# Patient Record
Sex: Female | Born: 1960 | Race: White | Hispanic: No | Marital: Married | Smoking: Never smoker
Health system: Southern US, Community
[De-identification: ages and names within clinical notes are randomized; demographics above are authoritative.]

## PROBLEM LIST (undated history)

## (undated) DIAGNOSIS — M359 Systemic involvement of connective tissue, unspecified: Secondary | ICD-10-CM

## (undated) DIAGNOSIS — J301 Allergic rhinitis due to pollen: Secondary | ICD-10-CM

## (undated) DIAGNOSIS — M1990 Unspecified osteoarthritis, unspecified site: Secondary | ICD-10-CM

## (undated) DIAGNOSIS — M069 Rheumatoid arthritis, unspecified: Secondary | ICD-10-CM

## (undated) DIAGNOSIS — M722 Plantar fascial fibromatosis: Secondary | ICD-10-CM

## (undated) DIAGNOSIS — E039 Hypothyroidism, unspecified: Secondary | ICD-10-CM

## (undated) HISTORY — DX: Unspecified osteoarthritis, unspecified site: M19.90

## (undated) HISTORY — PX: COLONOSCOPY: SHX174

## (undated) HISTORY — DX: Plantar fascial fibromatosis: M72.2

## (undated) HISTORY — DX: Hypothyroidism, unspecified: E03.9

## (undated) HISTORY — PX: TAYLOR BUNIONECTOMY: SHX2485

## (undated) HISTORY — DX: Rheumatoid arthritis, unspecified: M06.9

## (undated) HISTORY — DX: Allergic rhinitis due to pollen: J30.1

---

## 1991-04-08 HISTORY — PX: TREATMENT FISTULA ANAL: SUR1390

## 1999-04-08 HISTORY — PX: FRACTURE SURGERY: SHX138

## 2001-04-07 HISTORY — PX: BREAST BIOPSY: SHX20

## 2001-09-07 ENCOUNTER — Other Ambulatory Visit: Admission: RE | Admit: 2001-09-07 | Discharge: 2001-09-07 | Payer: Self-pay | Admitting: Internal Medicine

## 2003-03-15 ENCOUNTER — Encounter: Payer: Self-pay | Admitting: Internal Medicine

## 2003-04-08 HISTORY — PX: ENDOMETRIAL ABLATION: SHX621

## 2004-07-11 ENCOUNTER — Ambulatory Visit: Payer: Self-pay | Admitting: Family Medicine

## 2005-01-22 ENCOUNTER — Encounter (INDEPENDENT_AMBULATORY_CARE_PROVIDER_SITE_OTHER): Payer: Self-pay | Admitting: Specialist

## 2005-01-22 ENCOUNTER — Ambulatory Visit: Payer: Self-pay | Admitting: Internal Medicine

## 2005-01-22 ENCOUNTER — Other Ambulatory Visit: Admission: RE | Admit: 2005-01-22 | Discharge: 2005-01-22 | Payer: Self-pay | Admitting: Internal Medicine

## 2005-10-14 ENCOUNTER — Ambulatory Visit: Payer: Self-pay | Admitting: Chiropractor

## 2006-02-20 ENCOUNTER — Ambulatory Visit: Payer: Self-pay | Admitting: Family Medicine

## 2006-04-07 ENCOUNTER — Encounter: Payer: Self-pay | Admitting: Family Medicine

## 2006-04-07 LAB — CONVERTED CEMR LAB: Pap Smear: NORMAL

## 2006-04-16 ENCOUNTER — Ambulatory Visit: Payer: Self-pay | Admitting: Family Medicine

## 2006-04-16 ENCOUNTER — Encounter (INDEPENDENT_AMBULATORY_CARE_PROVIDER_SITE_OTHER): Payer: Self-pay | Admitting: Specialist

## 2006-04-16 ENCOUNTER — Other Ambulatory Visit: Admission: RE | Admit: 2006-04-16 | Discharge: 2006-04-16 | Payer: Self-pay | Admitting: Family Medicine

## 2006-04-17 ENCOUNTER — Ambulatory Visit: Payer: Self-pay | Admitting: Family Medicine

## 2006-04-17 LAB — CONVERTED CEMR LAB
Chol/HDL Ratio, serum: 4.4
Cholesterol: 206 mg/dL (ref 0–200)
LDL DIRECT: 148.9 mg/dL
TSH: 6.75 microintl units/mL — ABNORMAL HIGH (ref 0.35–5.50)
VLDL: 15 mg/dL (ref 0–40)

## 2006-06-02 ENCOUNTER — Ambulatory Visit: Payer: Self-pay | Admitting: Family Medicine

## 2006-06-02 LAB — CONVERTED CEMR LAB: TSH: 3.4 microintl units/mL (ref 0.35–5.50)

## 2007-05-19 ENCOUNTER — Encounter: Payer: Self-pay | Admitting: Family Medicine

## 2007-05-19 DIAGNOSIS — E039 Hypothyroidism, unspecified: Secondary | ICD-10-CM | POA: Insufficient documentation

## 2007-05-19 DIAGNOSIS — J301 Allergic rhinitis due to pollen: Secondary | ICD-10-CM | POA: Insufficient documentation

## 2007-05-20 ENCOUNTER — Other Ambulatory Visit: Admission: RE | Admit: 2007-05-20 | Discharge: 2007-05-20 | Payer: Self-pay | Admitting: Family Medicine

## 2007-05-20 ENCOUNTER — Encounter: Payer: Self-pay | Admitting: Family Medicine

## 2007-05-20 ENCOUNTER — Ambulatory Visit: Payer: Self-pay | Admitting: Family Medicine

## 2007-05-20 LAB — CONVERTED CEMR LAB: Pap Smear: NORMAL

## 2007-05-25 ENCOUNTER — Encounter (INDEPENDENT_AMBULATORY_CARE_PROVIDER_SITE_OTHER): Payer: Self-pay | Admitting: *Deleted

## 2007-05-26 ENCOUNTER — Ambulatory Visit: Payer: Self-pay | Admitting: Family Medicine

## 2007-06-03 LAB — CONVERTED CEMR LAB
ALT: 19 units/L (ref 0–35)
AST: 20 units/L (ref 0–37)
Alkaline Phosphatase: 53 units/L (ref 39–117)
BUN: 9 mg/dL (ref 6–23)
Bilirubin, Direct: 0.1 mg/dL (ref 0.0–0.3)
CO2: 29 meq/L (ref 19–32)
Calcium: 9.6 mg/dL (ref 8.4–10.5)
Chloride: 104 meq/L (ref 96–112)
Creatinine, Ser: 0.9 mg/dL (ref 0.4–1.2)
Glucose, Bld: 110 mg/dL — ABNORMAL HIGH (ref 70–99)
Total Protein: 7.2 g/dL (ref 6.0–8.3)

## 2007-06-11 ENCOUNTER — Telehealth: Payer: Self-pay | Admitting: Family Medicine

## 2007-06-24 ENCOUNTER — Encounter: Payer: Self-pay | Admitting: Family Medicine

## 2007-06-30 ENCOUNTER — Encounter (INDEPENDENT_AMBULATORY_CARE_PROVIDER_SITE_OTHER): Payer: Self-pay | Admitting: *Deleted

## 2007-07-21 ENCOUNTER — Ambulatory Visit: Payer: Self-pay | Admitting: Family Medicine

## 2007-07-23 LAB — CONVERTED CEMR LAB: TSH: 3.02 microintl units/mL (ref 0.35–5.50)

## 2007-08-04 ENCOUNTER — Ambulatory Visit: Payer: Self-pay | Admitting: Family Medicine

## 2007-08-04 DIAGNOSIS — M719 Bursopathy, unspecified: Secondary | ICD-10-CM

## 2007-08-04 DIAGNOSIS — M67919 Unspecified disorder of synovium and tendon, unspecified shoulder: Secondary | ICD-10-CM | POA: Insufficient documentation

## 2007-08-20 ENCOUNTER — Ambulatory Visit: Payer: Self-pay | Admitting: Family Medicine

## 2008-03-29 ENCOUNTER — Telehealth: Payer: Self-pay | Admitting: Family Medicine

## 2008-06-05 HISTORY — PX: ROTATOR CUFF REPAIR: SHX139

## 2008-06-29 ENCOUNTER — Ambulatory Visit (HOSPITAL_BASED_OUTPATIENT_CLINIC_OR_DEPARTMENT_OTHER): Admission: RE | Admit: 2008-06-29 | Discharge: 2008-06-29 | Payer: Self-pay | Admitting: Orthopedic Surgery

## 2008-07-13 ENCOUNTER — Ambulatory Visit: Payer: Self-pay | Admitting: Family Medicine

## 2008-07-18 ENCOUNTER — Ambulatory Visit: Payer: Self-pay | Admitting: Family Medicine

## 2008-07-19 LAB — CONVERTED CEMR LAB
AST: 36 units/L (ref 0–37)
Albumin: 3.8 g/dL (ref 3.5–5.2)
Alkaline Phosphatase: 81 units/L (ref 39–117)
BUN: 15 mg/dL (ref 6–23)
Bilirubin, Direct: 0.1 mg/dL (ref 0.0–0.3)
CO2: 31 meq/L (ref 19–32)
Calcium: 9.4 mg/dL (ref 8.4–10.5)
Chloride: 108 meq/L (ref 96–112)
Creatinine, Ser: 0.8 mg/dL (ref 0.4–1.2)
TSH: 0.87 microintl units/mL (ref 0.35–5.50)
Total Protein: 7.1 g/dL (ref 6.0–8.3)

## 2008-09-06 ENCOUNTER — Encounter: Admission: RE | Admit: 2008-09-06 | Discharge: 2008-09-06 | Payer: Self-pay | Admitting: Orthopedic Surgery

## 2008-09-27 ENCOUNTER — Ambulatory Visit (HOSPITAL_BASED_OUTPATIENT_CLINIC_OR_DEPARTMENT_OTHER): Admission: RE | Admit: 2008-09-27 | Discharge: 2008-09-28 | Payer: Self-pay | Admitting: Orthopedic Surgery

## 2008-10-27 ENCOUNTER — Encounter: Payer: Self-pay | Admitting: Family Medicine

## 2008-10-27 LAB — HM MAMMOGRAPHY

## 2009-08-24 ENCOUNTER — Telehealth: Payer: Self-pay | Admitting: Family Medicine

## 2009-08-24 ENCOUNTER — Ambulatory Visit: Payer: Self-pay | Admitting: Family Medicine

## 2009-08-24 DIAGNOSIS — M19049 Primary osteoarthritis, unspecified hand: Secondary | ICD-10-CM | POA: Insufficient documentation

## 2009-08-24 DIAGNOSIS — M25579 Pain in unspecified ankle and joints of unspecified foot: Secondary | ICD-10-CM | POA: Insufficient documentation

## 2009-08-26 ENCOUNTER — Other Ambulatory Visit: Admission: RE | Admit: 2009-08-26 | Discharge: 2009-08-26 | Payer: Self-pay | Admitting: Family Medicine

## 2009-08-29 ENCOUNTER — Ambulatory Visit: Payer: Self-pay | Admitting: Family Medicine

## 2009-08-29 DIAGNOSIS — E7800 Pure hypercholesterolemia, unspecified: Secondary | ICD-10-CM | POA: Insufficient documentation

## 2009-08-31 ENCOUNTER — Encounter (INDEPENDENT_AMBULATORY_CARE_PROVIDER_SITE_OTHER): Payer: Self-pay | Admitting: *Deleted

## 2009-08-31 LAB — CONVERTED CEMR LAB
ALT: 30 units/L (ref 0–35)
AST: 24 units/L (ref 0–37)
Alkaline Phosphatase: 61 units/L (ref 39–117)
Bilirubin, Direct: 0.1 mg/dL (ref 0.0–0.3)
Chloride: 104 meq/L (ref 96–112)
GFR calc non Af Amer: 94.77 mL/min (ref >60)
HDL: 54.1 mg/dL (ref >39.00)
Potassium: 4.2 meq/L (ref 3.5–5.1)
Rhuematoid fact SerPl-aCnc: 140 intl units/mL — ABNORMAL HIGH (ref 0.0–20.0)
Sodium: 142 meq/L (ref 135–145)
Total Bilirubin: 0.7 mg/dL (ref 0.3–1.2)
VLDL: 18.4 mg/dL (ref 0.0–40.0)

## 2009-09-21 ENCOUNTER — Encounter: Payer: Self-pay | Admitting: Family Medicine

## 2009-12-05 ENCOUNTER — Ambulatory Visit: Payer: Self-pay | Admitting: Family Medicine

## 2009-12-05 LAB — CONVERTED CEMR LAB
Cholesterol: 186 mg/dL (ref 0–200)
HDL: 51.6 mg/dL (ref >39.00)
LDL Cholesterol: 112 mg/dL — ABNORMAL HIGH (ref 0–99)
Total CHOL/HDL Ratio: 4
Triglycerides: 110 mg/dL (ref 0.0–149.0)
VLDL: 22 mg/dL (ref 0.0–40.0)

## 2009-12-24 ENCOUNTER — Encounter: Payer: Self-pay | Admitting: Family Medicine

## 2010-05-07 NOTE — Consult Note (Signed)
Summary: Stacey Drain MD  Stacey Drain MD   Imported By: Sherian Rein 10/01/2009 08:03:21  _____________________________________________________________________  External Attachment:    Type:   Image     Comment:   External Document

## 2010-05-07 NOTE — Progress Notes (Signed)
Summary: needs order for mammogram  Phone Note Call from Patient   Caller: Patient Call For: Kerby Nora MD Summary of Call: Pt needs order for mammogram, to send to [facility] on monday.  She goes to [facility]. Initial call taken by: Lowella Petties CMA,  Aug 24, 2009 5:26 PM  Follow-up for Phone Call        Sent at CPX.  Follow-up by: Kerby Nora MD,  Aug 24, 2009 5:28 PM

## 2010-05-07 NOTE — Letter (Signed)
Summary: Results Follow up Letter  [place] [facility]  [address] [place], [place] [address]   Phone: [phone]  Fax: [phone]    08/31/2009 MRN: [id number]     [facility] [address] [place], [place]  [address]    Dear Amy Hart,  The following are the results of your recent test(s):  Test         Result    Pap Smear:        Normal __x___  Not Normal _____ Comments:Repeat in 1 year ______________________________________________________ Cholesterol: LDL(Bad cholesterol):         Your goal is less than:         HDL (Good cholesterol):       Your goal is more than: Comments:  ______________________________________________________ Mammogram:        Normal _____  Not Normal _____ Comments:  ___________________________________________________________________ Hemoccult:        Normal _____  Not normal _______ Comments:    _____________________________________________________________________ Other Tests:    We routinely do not discuss normal results over the telephone.  If you desire a copy of the results, or you have any questions about this information we can discuss them at your next office visit.   Sincerely,  Kerby Nora MD

## 2010-05-07 NOTE — Letter (Signed)
Summary: Stacey Drain MD Rheumatology  Stacey Drain MD Rheumatology   Imported By: Lanelle Bal 01/03/2010 13:56:54  _____________________________________________________________________  External Attachment:    Type:   Image     Comment:   External Document

## 2010-05-07 NOTE — Assessment & Plan Note (Signed)
Summary: CPX / LFW   Vital Signs:  Patient profile:   50 year old female Height:      64 inches Weight:      198 pounds BMI:     34.11 Temp:     98.3 degrees F oral Pulse rate:   56 / minute Pulse rhythm:   regular BP sitting:   120 / 84  (left arm) Cuff size:   regular  Vitals Entered By: Lowella Petties CMA (Aug 24, 2009 3:55 PM) CC: 30 minute check up   History of Present Illness:  The patient is here for annual wellness exam and preventative care.    Doing overall.   Back to running regularly...but now with pain in right medial post ankle..not exactly over achilles. Occ swelling. Occured with sudden pain after running.    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 Take 1 Tablet By Mouth Once A Day 2)  Multivitamins   Tabs (Multiple Vitamin) .... Take 1 Tablet By Mouth Once A Day 3)  Vitamin C 500 Mg  Tabs (Ascorbic Acid) .... Take 1 Tablet By Mouth Once A Day 4)  Glucosamine-Chondroitin 1500-1200 Mg/33ml  Liqd (Glucosamine-Chondroitin) .... Take 1 Tablet By Mouth Once A Day  Allergies (verified): 1)  Naprosyn (Naproxen)  Past History:  Past medical, surgical, family and social histories (including risk factors) reviewed, and no changes noted (except as noted below).  Past Medical History: Reviewed history from 05/19/2007 and no changes required. ALLERGIC RHINITIS, SEASONAL (ICD-477.0) HYPOTHYROIDISM (ICD-244.9) ATRIAL FIBRILLATION (ICD-427.31)    Past Surgical  History: 1993      Anal fistula repair 1995      Kidney stone? 1/01       Fx., right elbow 2005      Endometrial ablation 1/06       Colposcopy/biopsy (-) [facility]) 2003      Biopsy, right breast (-) 06/2008   shoulder surgery, RCuff tear. 09/2008    elbow surgery  Family History: Reviewed history from 05/20/2007 and no changes required. Father: DM, low BP Mother: hypothyroidism, HTN Siblings: 2 sisters  Social History: Reviewed history from 05/20/2007 and no changes required. Never Smoked Alcohol use-no Marital Status: Single, homosexual Children: None Occupation: Technical brewer at [employer] No exercise Diet: moderate  Review of Systems General:  Denies fatigue. CV:  Denies chest pain or discomfort. Resp:  Denies shortness of breath. GI:  Denies abdominal pain, constipation, and diarrhea. GU:  Denies dysuria. MS:  Complains of joint pain; denies joint redness and joint swelling; Occ uses tylenol arthritis...strong family hx of rheumatoid arthritis. Pain in left PIP and thumb...noted in last year.Marland Kitchen  Physical Exam  General:  Overweight female in NAD Eyes:  No corneal or conjunctival inflammation noted. EOMI. Perrla. Funduscopic exam benign, without hemorrhages, exudates or papilledema. Vision grossly normal. Ears:  External ear exam shows no significant lesions or deformities.  Otoscopic examination reveals clear canals, tympanic membranes are intact bilaterally without bulging, retraction, inflammation or discharge. Hearing is grossly normal bilaterally. Nose:  External nasal examination shows no  deformity or inflammation. Nasal mucosa are pink and moist without lesions or exudates. Mouth:  Oral mucosa and oropharynx without lesions or exudates.  Teeth in good repair. Neck:  no carotid bruit or thyromegaly no cervical or supraclavicular lymphadenopathy  Chest Wall:  No deformities, masses, or tenderness noted. Breasts:  No mass, nodules, thickening, tenderness,  bulging, retraction, inflamation, nipple discharge or skin changes noted.   Lungs:  Normal respiratory effort, chest expands symmetrically. Lungs are clear to auscultation, no crackles or wheezes. Heart:  Normal rate and regular rhythm. S1 and S2 normal without gallop, murmur, click, rub or other extra sounds. Abdomen:  Bowel sounds positive,abdomen soft and non-tender without masses, organomegaly or hernias noted. Genitalia:  Pelvic Exam:        External: normal female genitalia without lesions or masses        Vagina: normal without lesions or masses        Cervix: normal without lesions or masses        Adnexa: normal bimanual exam without masses or fullness        Uterus: normal by palpation        Pap smear: performed Msk:  right medial ankle pain posterior to latereal malleolus Pulses:  R and L posterior tibial pulses are full and equal bilaterally  Extremities:  no edema Neurologic:  No cranial nerve deficits noted. Station and gait are normal. Plantar reflexes are down-going bilaterally. DTRs are symmetrical throughout. Sensory, motor and coordinative functions appear intact. Skin:  Intact without suspicious lesions or rashes Psych:  Cognition and judgment appear intact. Alert and cooperative with normal attention span and concentration. No apparent delusions, illusions, hallucinations   Impression & Recommendations:  Problem # 1:  PHYSICAL EXAMINATION (ICD-V70.0) The patient's preventative maintenance and recommended screening tests for an annual wellness exam were reviewed in full today. Brought up to date unless services declined.  Counselled on the importance of diet, exercise, and its role in overall health and mortality. The patient's FH and SH was reviewed, including their home life, tobacco status, and drug and alcohol status.     Problem # 2:  ROUTINE GYNECOLOGICAL EXAMINATION (ICD-V72.31)  Problem # 3:  ANKLE PAIN, RIGHT (ICD-719.47) Posteroir tibialsynovitis. Treat  with ice, stretching, limiting standing, and NSAIDs.  Call if not improving as expected for referral to Dr. Lucy Antigua.   Problem # 4:  PAIN IN JOINT, HAND (ICD-719.44) Famly hx of RA and current concern given pain in small joints, although not assymetric.   Problem # 5:  PURE HYPERCHOLESTEROLEMIA (ICD-272.0) Due for reeval Encouraged exercise, weight loss, healthy eating habits.  Labs Reviewed: SGOT: 36 (07/18/2008)   SGPT: 44 (07/18/2008)   HDL:44.8 (05/26/2007), 47.1 (04/17/2006)  LDL:DEL (05/26/2007), DEL (04/17/2006)  Chol:202 (05/26/2007), 206 (04/17/2006)  Trig:120 (05/26/2007), 76 (04/17/2006)  Problem # 6:  HYPOTHYROIDISM (ICD-244.9) Well controlled. Continue current medication.  Her updated medication list for this problem includes:    Levoxyl 100 Mcg Tabs (Levothyroxine sodium) .Marland Kitchen... Take 1 tablet by mouth once a day  Complete Medication List: 1)  Levoxyl 100 Mcg Tabs (Levothyroxine sodium) .... Take 1 tablet by mouth once a day 2)  Multivitamins Tabs (Multiple vitamin) .... Take 1 tablet by mouth once a day 3)  Vitamin C 500 Mg Tabs (Ascorbic acid) .... Take 1 tablet by mouth once a day 4)  Glucosamine-chondroitin 1500-1200 Mg/29ml Liqd (Glucosamine-chondroitin) .... Take 1 tablet by mouth once a day  Other Orders: Radiology Referral (Radiology)  Patient Instructions: 1)  Dx 719.44 sed rate, anticcp antibodies, RF, Dx 272.0 CMET, lipids 2)  Referral Appointment Information 3)  Day/Date: 4)  Time: 5)  Place/MD: 6)  Address: 7)  Phone/Fax: 8)  Patient given appointment information. Information/Orders faxed/mailed.   9)  No running, limited standing until next OV. 10)  Wear lace up ankle brace on right ankle. 11)   Gentle range of motion exercises daily. 12)  Follow up appt in 2-3 weeks.  Prior Medications (reviewed today): LEVOXYL 100 MCG  TABS (LEVOTHYROXINE SODIUM) Take 1 tablet by mouth once a day MULTIVITAMINS   TABS (MULTIPLE VITAMIN) Take 1 tablet by mouth  once a day VITAMIN C 500 MG  TABS (ASCORBIC ACID) Take 1 tablet by mouth once a day GLUCOSAMINE-CHONDROITIN 1500-1200 MG/30ML  LIQD (GLUCOSAMINE-CHONDROITIN) Take 1 tablet by mouth once a day Current Allergies (reviewed today): NAPROSYN (NAPROXEN)    Past Medical History:    Reviewed history from 05/19/2007 and no changes required:       ALLERGIC RHINITIS, SEASONAL (ICD-477.0)       HYPOTHYROIDISM (ICD-244.9)       ATRIAL FIBRILLATION (ICD-427.31)          Past Surgical History:    Reviewed history from 05/19/2007 and no changes required:       1993      Anal fistula repair       1995      Kidney stone?       1/01       Fx., right elbow       2005      Endometrial ablation       1/06       Colposcopy/biopsy (-) [facility])       2003      Biopsy, right breast (-)       06/2008   shoulder surgery, RCuff tear.       09/2008    elbow surgery

## 2010-08-20 NOTE — Op Note (Signed)
[name], Amy Hart                ACCOUNT NO.:  [id number]   MEDICAL RECORD NO.:  [id number]          PATIENT TYPE:  AMB   LOCATION:  DSC                          FACILITY:  [facility]   PHYSICIAN:  Loreta Ave, M.D. 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   POSTOPERATIVE DIAGNOSES:  Posttraumatic arthritis, right elbow with  extensive loose bodies and posterior bony impingement with numerous  anterior and posterior loose bodies.  Reactive synovitis.  Diffuse grade  2 and 3 changes with grade 4 changes, radial capitellar joint.  Marked  loss of extension with posterior spurring.   PROCEDURE:  Right elbow exam under anesthesia, arthroscopy with  synovectomy, chondroplasty, and removal of loose bodies, anterior and  posterior.  Aided by a lateral arthrotomy to facilitate removal of the  large loose bodies, partially tethered laterally.  Posterior  decompression, arthroscopically with removal of loose bodies and spurs.   SURGEON:  Loreta Ave, MD   ASSISTANT:  Genene Churn. Barry Dienes, [place], present throughout the entire case  necessary for timely completion of procedure.   ANESTHESIA:  General.   BLOOD LOSS:  Minimal.   TOURNIQUET TIME:  2 hours.   SPECIMENS:  None.   CULTURES:  None.   COMPLICATIONS:  None.   PROCEDURE:  Sterile compressive.   PROCEDURE:  The patient was brought to the operating room and after  adequate anesthesia had been obtained, right elbow was examined..  Fairly good flexion, pronation and supination.  Leg extension for almost  20 degrees.  Palpable loose bodies laterally.  Tourniquet applied,  prepped and draped in the usual sterile fashion.  Exsanguinated with  elevation of Esmarch, tourniquet was inflated to 250 mmHg.  Anteromedial  and  anterolateral portals avoiding neurovascular structures.  Blunt  obturator was used to enter the elbow which was difficult because of the  amount of adhesions and scarring.  Finally entered, arthroscope was  induced, distended and inspected.  Hypertrophic synovitis and adhesions  scar tissues were removed.  Chondroplasty to a stable surface.  The  obvious small loose bodies were removed arthroscopically.  Some of these  very large partially tethered especially laterally.  I made a lateral  arthrotomy through the lateral extensors.  Skin and subcutaneous tissue  were divided.  Extensors capsule divided exposing the joint.  Remaining  partially tethered and other large loose bodies removed.  Fluoroscopic  confirmation of adequate removal.  Wound was irrigated.  The  split in  extensors was closed with Vicryl.  Attention was turned posteriorly.  Two portals of the midline, medial and lateral through the triceps  tendon.  The posterior elbow entered with blunt obturator.  Arthroscope  was induced, distended and inspected.  Remaining loose bodies removed.  Sequential removal of spurs and then synovitis and then spurring of the  fossa and olecranon until all posterior impingement obliterated.  Confirmed adequate excision fluoroscopically and arthroscopically.  Full  extension on completion.  The entire elbow examined and no other  findings appreciated.  Instruments and fluid were all removed.  Portals  were closed with nylon.  Incision was closed with Vicryl and staples.  Sterile compressive dressing applied.  Tourniquet deflated and removed.  Anesthesia was reversed.  Brought to the recovery room.  Tolerated the  surgery well.  No complications.      Loreta Ave, M.D.  Electronically Signed     DFM/MEDQ  D:  09/27/2008  T:  09/28/2008  Job:  [id number]

## 2010-08-20 NOTE — Op Note (Signed)
Amy Hart, Amy Hart                ACCOUNT NO.:  [id number]   MEDICAL RECORD NO.:  [id number]          PATIENT TYPE:  AMB   LOCATION:  DSC                          FACILITY:  [facility]   PHYSICIAN:  Loreta Ave, M.D. DATE OF BIRTH:  1960/09/17   DATE OF PROCEDURE:  06/29/2008  DATE OF DISCHARGE:                               OPERATIVE REPORT   PREOPERATIVE DIAGNOSIS:  Right shoulder impingement distal clavicle  osteolysis rotator cuff tear.   POSTOPERATIVE DIAGNOSIS:  Right shoulder impingement distal clavicle  osteolysis rotator cuff tear.   PROCEDURE:  Right shoulder exam under anesthesia arthroscopy with  arthroscopic assisted rotator cuff repair with fiber weave suture x2  swivel lock anchors x2.  Bursectomy, acromioplasty, coracoacromial  ligament release.  Excision distal clavicle.   SURGEON:  Loreta Ave, MD   ASSISTANT:  Genene Churn. Barry Dienes, PA present throughout the entire case and  necessary for timely completion of procedure.   ANESTHESIA:  General.   BLOOD LOSS:  Minimal.   SPECIMENS:  None.   CULTURES:  None.   COMPLICATIONS:  None.   DRESSING:  Soft compressive with shoulder immobilizer.   PROCEDURE:  The patient was brought to the operating room and placed on  the operating table in supine position.  After adequate anesthesia have  been obtained, shoulder examined.  Full motion stable shoulder.  Placed  in a beach-chair position on the shoulder positioner, prepped and draped  in the usual sterile fashion.  Three portals anterior, posterior and  lateral.  Shoulder entered with blunt obturator.  Arthroscope introduced  and the shoulder distended and inspected.  Full thickness tear anterior  two third of the supraspinatus tendon.  Debrided back to healthy tissue.  Very mobile and reparable.  Tuberosity roughened to bleeding bone.  Articular cartilage, biceps tendon, biceps anchor and labrum all  otherwise intact.  From above type 2 acromion debrided.   Converted to a  type 1 with a shaver and high-speed bur releasing the CA ligament with  cautery.  Distal clavicle grade 3 changes with some spurring.  Periarticular spurs and lateral centimeter of clavicle resected.  Adequacy of decompression confirmed viewing from all portals.  Cannula  placed lateral and anterior.  The cuff was mobilized.  Captured with two  horizontal mattress fiber weave sutures with the scorpion device.  Suture brought out the front and then sequentially out laterally.  Both  were captured with a swivel lock anchor and then firmly anchored down in  the tuberosity to two anchoring sites with  swivel lock anchors.  Nice firm watertight closure and repair of the  cuff confirmed.  Instruments and fluid removed.  Portals and shoulder  vests injected with Marcaine.  Portals closed with 4-0 nylon.  Sterile  compressive dressing applied.  Anesthesia reversed.  Brought to the  recovery room.  Tolerated the surgery well and no complications.      Loreta Ave, M.D.  Electronically Signed     DFM/MEDQ  D:  06/29/2008  T:  06/30/2008  Job:  [id number]

## 2010-10-28 ENCOUNTER — Telehealth: Payer: Self-pay | Admitting: Family Medicine

## 2010-10-28 DIAGNOSIS — E7800 Pure hypercholesterolemia, unspecified: Secondary | ICD-10-CM

## 2010-10-28 DIAGNOSIS — E039 Hypothyroidism, unspecified: Secondary | ICD-10-CM

## 2010-10-28 NOTE — Telephone Encounter (Signed)
Message copied by Excell Seltzer on Mon Oct 28, 2010  8:05 AM ------      Message from: Baldomero Lamy      Created: Tue Oct 22, 2010 12:10 PM      Regarding: Cpx labs tues       Please order  future cpx labs for pt's upcomming lab appt.      Thanks      Rodney Booze

## 2010-10-29 ENCOUNTER — Other Ambulatory Visit (INDEPENDENT_AMBULATORY_CARE_PROVIDER_SITE_OTHER): Payer: BC Managed Care – PPO | Admitting: Family Medicine

## 2010-10-29 DIAGNOSIS — E7800 Pure hypercholesterolemia, unspecified: Secondary | ICD-10-CM

## 2010-10-29 DIAGNOSIS — E039 Hypothyroidism, unspecified: Secondary | ICD-10-CM

## 2010-10-29 LAB — LIPID PANEL
Cholesterol: 175 mg/dL (ref 0–200)
LDL Cholesterol: 103 mg/dL — ABNORMAL HIGH (ref 0–99)
Total CHOL/HDL Ratio: 4

## 2010-10-29 LAB — COMPREHENSIVE METABOLIC PANEL
Albumin: 4 g/dL (ref 3.5–5.2)
Alkaline Phosphatase: 55 U/L (ref 39–117)
CO2: 30 mEq/L (ref 19–32)
Calcium: 9.2 mg/dL (ref 8.4–10.5)
Chloride: 104 mEq/L (ref 96–112)
Glucose, Bld: 90 mg/dL (ref 70–99)
Potassium: 4.4 mEq/L (ref 3.5–5.1)
Sodium: 140 mEq/L (ref 135–145)
Total Protein: 7.2 g/dL (ref 6.0–8.3)

## 2010-11-12 ENCOUNTER — Encounter: Payer: Self-pay | Admitting: Family Medicine

## 2010-11-13 ENCOUNTER — Encounter: Payer: Self-pay | Admitting: Family Medicine

## 2010-11-13 LAB — HM PAP SMEAR

## 2010-11-22 ENCOUNTER — Encounter: Payer: Self-pay | Admitting: Family Medicine

## 2010-11-22 ENCOUNTER — Ambulatory Visit (INDEPENDENT_AMBULATORY_CARE_PROVIDER_SITE_OTHER): Payer: BC Managed Care – PPO | Admitting: Family Medicine

## 2010-11-22 DIAGNOSIS — E039 Hypothyroidism, unspecified: Secondary | ICD-10-CM

## 2010-11-22 DIAGNOSIS — Z1231 Encounter for screening mammogram for malignant neoplasm of breast: Secondary | ICD-10-CM

## 2010-11-22 DIAGNOSIS — Z01419 Encounter for gynecological examination (general) (routine) without abnormal findings: Secondary | ICD-10-CM

## 2010-11-22 DIAGNOSIS — Z0000 Encounter for general adult medical examination without abnormal findings: Secondary | ICD-10-CM

## 2010-11-22 DIAGNOSIS — E7800 Pure hypercholesterolemia, unspecified: Secondary | ICD-10-CM

## 2010-11-22 NOTE — Patient Instructions (Signed)
When she is able, start back on exercise.

## 2010-11-22 NOTE — Progress Notes (Signed)
Subjective:    Patient ID: Amy Hart, female    DOB: [birth year], 50 y.o.   MRN: [id number]  HPI  50 year old female here for CPX.  Hypothyroid. Well controlled on replacement Lab Results  Component Value Date   TSH 2.09 10/29/2010    High cholesterol:  At goal LDL < 130 on no medication.   Past joint pain.. She has OA of hands. Pother areas of pain, along with elevated RF... Saw Dr. Kellie Simmering last year. Felt not enough swelling or arthritis to treat with methotrexate. She does well on OTC meds , but pain is much worse over the winter in hands. Last saw him in 07/2010.. Had steroid injection in  MCP joint of left thumb. She is on glucosamine.  Right foot: Dr. Donzetta Kohut.. 09/2010 tailor's bunion treated.  Foot currently in post op shoe because screw being pushed out.  Has follow up with her 8/22.  She is not currently exercising due to foot issues. Has gained 7 lbs in last 2 months.   Review of Systems  Constitutional: Negative for fever, fatigue and unexpected weight change.  HENT: Negative for ear pain, congestion, sore throat, sneezing, trouble swallowing and sinus pressure.   Eyes: Negative for pain and itching.  Respiratory: Negative for cough, chest tightness, shortness of breath and wheezing.        Continues to have a nagging cough since URI  7/8  Cardiovascular: Negative for chest pain, palpitations and leg swelling.  Gastrointestinal: Positive for constipation. Negative for nausea, abdominal pain, diarrhea and blood in stool.  Genitourinary: Negative for dysuria, hematuria, vaginal discharge, difficulty urinating and menstrual problem.  Skin: Negative for rash.  Neurological: Negative for syncope, weakness, light-headedness, numbness and headaches.  Psychiatric/Behavioral: Negative for confusion and dysphoric mood. The patient is not nervous/anxious.        Objective:   Physical Exam  Constitutional: Vital signs are normal. She appears well-developed and  well-nourished. She is cooperative.  Non-toxic appearance. She does not appear ill. No distress.  HENT:  Head: Normocephalic.  Right Ear: Hearing, tympanic membrane, external ear and ear canal normal.  Left Ear: Hearing, tympanic membrane, external ear and ear canal normal.  Nose: Nose normal.  Eyes: Conjunctivae, EOM and lids are normal. Pupils are equal, round, and reactive to light. No foreign bodies found.  Neck: Trachea normal and normal range of motion. Neck supple. Carotid bruit is not present. No mass and no thyromegaly present.  Cardiovascular: Normal rate, regular rhythm, S1 normal, S2 normal, normal heart sounds and intact distal pulses.  Exam reveals no gallop.   No murmur heard. Pulmonary/Chest: Effort normal and breath sounds normal. No respiratory distress. She has no wheezes. She has no rhonchi. She has no rales.  Abdominal: Soft. Normal appearance and bowel sounds are normal. She exhibits no distension, no fluid wave, no abdominal bruit and no mass. There is no hepatosplenomegaly. There is no tenderness. There is no rebound, no guarding and no CVA tenderness. No hernia.  Genitourinary: Vagina normal and uterus normal. No breast swelling, tenderness, discharge or bleeding. Pelvic exam was performed with patient prone. There is no rash, tenderness, lesion or injury on the right labia. There is no rash, tenderness, lesion or injury on the left labia. Uterus is not deviated, not enlarged, not fixed and not tender. Cervix exhibits no motion tenderness, no discharge and no friability. Right adnexum displays no mass, no tenderness and no fullness. Left adnexum displays no mass, no tenderness and no fullness.  No erythema, tenderness or bleeding around the vagina. No foreign body around the vagina. No signs of injury around the vagina. No vaginal discharge found.       No pap performed  Lymphadenopathy:    She has no cervical adenopathy.    She has no axillary adenopathy.  Neurological: She  is alert. She has normal strength. No cranial nerve deficit or sensory deficit.  Skin: Skin is warm, dry and intact. No rash noted.  Psychiatric: Her speech is normal and behavior is normal. Judgment normal. Her mood appears not anxious. Cognition and memory are normal. She does not exhibit a depressed mood.          Assessment & Plan:  Complete Physcial Exam: The patient's preventative maintenance and recommended screening tests for an annual wellness exam were reviewed in full today. Brought up to date unless services declined.  Counselled on the importance of diet, exercise, and its role in overall health and mortality. The patient's FH and SH was reviewed, including their home life, tobacco status, and drug and alcohol status.   Paps nml 3 in row , so skip this year, next pap 2013. Uptodate with TDap. Due for mammogram.

## 2010-11-22 NOTE — Assessment & Plan Note (Signed)
Well controlled on no medication  

## 2010-11-22 NOTE — Assessment & Plan Note (Signed)
Well controlled. Continue current medication.  

## 2010-11-28 ENCOUNTER — Other Ambulatory Visit: Payer: Self-pay | Admitting: *Deleted

## 2010-11-28 MED ORDER — LEVOTHYROXINE SODIUM 100 MCG PO TABS: 100.0000 ug | ORAL_TABLET | Freq: Every day | ORAL | Status: DC

## 2010-12-02 ENCOUNTER — Encounter: Payer: Self-pay | Admitting: Family Medicine

## 2010-12-03 LAB — HM MAMMOGRAPHY: HM Mammogram: NORMAL

## 2011-10-10 ENCOUNTER — Encounter: Payer: BC Managed Care – PPO | Admitting: Family Medicine

## 2011-11-24 ENCOUNTER — Other Ambulatory Visit: Payer: Self-pay | Admitting: *Deleted

## 2011-11-24 MED ORDER — LEVOTHYROXINE SODIUM 100 MCG PO TABS: 100.0000 ug | ORAL_TABLET | Freq: Every day | ORAL | Status: DC

## 2011-11-25 ENCOUNTER — Encounter: Payer: Self-pay | Admitting: Family Medicine

## 2011-11-25 ENCOUNTER — Ambulatory Visit (INDEPENDENT_AMBULATORY_CARE_PROVIDER_SITE_OTHER): Payer: BC Managed Care – PPO | Admitting: Family Medicine

## 2011-11-25 VITALS — BP 110/72 | HR 63 | Temp 98.7°F | Ht 65.0 in | Wt 200.5 lb

## 2011-11-25 DIAGNOSIS — Z1211 Encounter for screening for malignant neoplasm of colon: Secondary | ICD-10-CM

## 2011-11-25 DIAGNOSIS — Z1231 Encounter for screening mammogram for malignant neoplasm of breast: Secondary | ICD-10-CM

## 2011-11-25 DIAGNOSIS — E039 Hypothyroidism, unspecified: Secondary | ICD-10-CM

## 2011-11-25 DIAGNOSIS — Z0000 Encounter for general adult medical examination without abnormal findings: Secondary | ICD-10-CM

## 2011-11-25 DIAGNOSIS — E7800 Pure hypercholesterolemia, unspecified: Secondary | ICD-10-CM

## 2011-11-25 LAB — TSH: TSH: 2.42 u[IU]/mL (ref 0.35–5.50)

## 2011-11-25 LAB — COMPREHENSIVE METABOLIC PANEL
ALT: 30 U/L (ref 0–35)
AST: 26 U/L (ref 0–37)
CO2: 30 mEq/L (ref 19–32)
Chloride: 102 mEq/L (ref 96–112)
GFR: 71.18 mL/min (ref >60.00)
Sodium: 139 mEq/L (ref 135–145)
Total Bilirubin: 0.8 mg/dL (ref 0.3–1.2)
Total Protein: 7.2 g/dL (ref 6.0–8.3)

## 2011-11-25 LAB — LDL CHOLESTEROL, DIRECT: Direct LDL: 131.3 mg/dL

## 2011-11-25 LAB — LIPID PANEL: VLDL: 16.8 mg/dL (ref 0.0–40.0)

## 2011-11-25 NOTE — Assessment & Plan Note (Signed)
Due for re-eval. 

## 2011-11-25 NOTE — Patient Instructions (Addendum)
Get back on track with healthy eating and exercise. Work on weight loss.  We will call with lab results. Stop at front desk to set up mammogram and colonoscopy.

## 2011-11-25 NOTE — Progress Notes (Signed)
Subjective:    Patient ID: Amy Hart, female    DOB: 01-Dec-1960, 51 y.o.   MRN: [id number]  HPI  The patient is here for annual wellness exam and preventative care.    Elevated Cholesterol:Due for re-eval. Lab Results  Component Value Date   CHOL 175 10/29/2010   HDL 48.00 10/29/2010   LDLCALC 103* 10/29/2010   LDLDIRECT 149.1 08/29/2009   TRIG 121.0 10/29/2010   CHOLHDL 4 10/29/2010    Using medications without problems:on fish oil  Muscle aches: None Diet compliance: Poor, gaining weight. A lot of emotional stress. Exercise:None , trying to get back to runing. Other complaints:  Hypothyroid: Due for re-eval. 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 Prednisone at higher dose caused anger issues. Review of Systems  Constitutional: Negative for fever, fatigue and unexpected weight change.  HENT: Negative for ear pain, congestion, sore throat, sneezing, trouble swallowing and sinus pressure.   Eyes: Negative for pain and itching.  Respiratory: Negative for cough, shortness of breath and wheezing.   Cardiovascular: Negative for chest pain, palpitations and leg swelling.  Gastrointestinal: Negative for nausea, abdominal pain, diarrhea, constipation and blood in stool.  Genitourinary: Negative for dysuria, hematuria, vaginal discharge, difficulty urinating and menstrual problem.  Skin: Negative for rash.  Neurological: Negative for syncope, weakness, light-headedness, numbness and headaches.  Psychiatric/Behavioral: Negative for confusion and dysphoric mood. The patient is not nervous/anxious.        HAs had a lot of stress this year, father died and partner with breast cancer.       Objective:   Physical Exam  Constitutional: Vital signs are normal. She appears well-developed and well-nourished. She is cooperative.  Non-toxic appearance. She does not appear ill.  No distress.  HENT:  Head: Normocephalic.  Right Ear: Hearing, tympanic membrane, external ear and ear canal normal.  Left Ear: Hearing, tympanic membrane, external ear and ear canal normal.  Nose: Nose normal.  Eyes: Conjunctivae, EOM and lids are normal. Pupils are equal, round, and reactive to light. No foreign bodies found.  Neck: Trachea normal and normal range of motion. Neck supple. Carotid bruit is not present. No mass and no thyromegaly present.  Cardiovascular: Normal rate, regular rhythm, S1 normal, S2 normal, normal heart sounds and intact distal pulses.  Exam reveals no gallop.   No murmur heard. Pulmonary/Chest: Effort normal and breath sounds normal. No respiratory distress. She has no wheezes. She has no rhonchi. She has no rales.  Abdominal: Soft. Normal appearance and bowel sounds are normal. She exhibits no distension, no fluid wave, no abdominal bruit and no mass. There is no hepatosplenomegaly. There is no tenderness. There is no rebound, no guarding and no CVA tenderness. No hernia.  Genitourinary: Vagina normal and uterus normal. No breast swelling, tenderness, discharge or bleeding. Pelvic exam was performed with patient prone. There is no rash, tenderness, lesion or injury on the right labia. There is no rash, tenderness, lesion or injury on the left labia. Uterus is not deviated, not enlarged, not fixed and not tender. Cervix exhibits no motion tenderness, no discharge and no friability. Right adnexum displays no mass, no tenderness and no fullness. Left adnexum displays no mass, no tenderness and no fullness. No erythema, tenderness or bleeding around the vagina. No foreign body around the vagina. No signs of injury around the vagina. No vaginal discharge found.  No pap performed  Lymphadenopathy:    She has no cervical adenopathy.    She has no axillary adenopathy.  Neurological: She is alert. She has normal strength. No cranial nerve deficit or sensory deficit.    Skin: Skin is warm, dry and intact. No rash noted.  Psychiatric: Her speech is normal and behavior is normal. Judgment normal. Her mood appears not anxious. Cognition and memory are normal. She does not exhibit a depressed mood.          Assessment & Plan:  The patient's preventative maintenance and recommended screening tests for an annual wellness exam were reviewed in full today. Brought up to date unless services declined.  Counselled on the importance of diet, exercise, and its role in overall health and mortality. The patient's FH and SH was reviewed, including their home life, tobacco status, and drug and alcohol status.   Vaccines:uptodate with td. Nonsmoker. PAP/DVE: Q 3 year, last done 2011, nml due 2014 Mammo:due  Colon:

## 2011-12-02 ENCOUNTER — Encounter: Payer: Self-pay | Admitting: *Deleted

## 2011-12-02 ENCOUNTER — Encounter: Payer: Self-pay | Admitting: Family Medicine

## 2011-12-02 LAB — HM MAMMOGRAPHY: HM Mammogram: NORMAL

## 2011-12-25 ENCOUNTER — Other Ambulatory Visit: Payer: Self-pay

## 2011-12-25 MED ORDER — LEVOTHYROXINE SODIUM 100 MCG PO TABS: 100.0000 ug | ORAL_TABLET | Freq: Every day | ORAL | Status: DC

## 2012-01-02 ENCOUNTER — Encounter: Payer: Self-pay | Admitting: Internal Medicine

## 2012-01-02 ENCOUNTER — Ambulatory Visit (INDEPENDENT_AMBULATORY_CARE_PROVIDER_SITE_OTHER): Payer: BC Managed Care – PPO | Admitting: Internal Medicine

## 2012-01-02 VITALS — BP 100/70 | HR 68 | Ht 63.75 in | Wt 200.2 lb

## 2012-01-02 DIAGNOSIS — Z1211 Encounter for screening for malignant neoplasm of colon: Secondary | ICD-10-CM

## 2012-01-02 DIAGNOSIS — M069 Rheumatoid arthritis, unspecified: Secondary | ICD-10-CM | POA: Insufficient documentation

## 2012-01-02 MED ORDER — NA SULFATE-K SULFATE-MG SULF 17.5-3.13-1.6 GM/177ML PO SOLN: ORAL | Status: DC

## 2012-01-02 NOTE — Progress Notes (Signed)
  Subjective:    Patient ID: Amy Hart, female    DOB: 1960/11/13, 51 y.o.   MRN: [id number]  HPI Is a very pleasant 51 year old white woman who has no GI symptoms. She is here to schedule a screening colonoscopy.  Medications, allergies, past medical history, past surgical history, family history and social history are reviewed and updated in the EMR.  Review of Systems Positive for allergies and back pain    Objective:   Physical Exam General:  NAD Eyes:   anicteric Lungs:  clear Heart:  S1S2 no rubs, murmurs or gallops Abdomen:  soft and nontender, BS+ Ext:   no edema        Assessment & Plan:   1. Special screening for malignant neoplasms, colon    1. We'll schedule routine screening colonoscopy. The risks and benefits as well as alternatives of endoscopic procedure(s) have been discussed and reviewed. All questions answered. The patient agrees to proceed. 2.

## 2012-01-02 NOTE — Patient Instructions (Addendum)
You have been scheduled for a colonoscopy with propofol. Please follow written instructions given to you at your visit today.  Please pick up your prep kit at the pharmacy within the next 1-3 days. If you use inhalers (even only as needed), please bring them with you on the day of your procedure.  Thank you for choosing me and [facility].  Carl E. Gessner, M.D., FACG  

## 2012-01-22 ENCOUNTER — Encounter: Payer: BC Managed Care – PPO | Admitting: Internal Medicine

## 2012-02-05 ENCOUNTER — Ambulatory Visit (AMBULATORY_SURGERY_CENTER): Payer: BC Managed Care – PPO | Admitting: Internal Medicine

## 2012-02-05 ENCOUNTER — Encounter: Payer: Self-pay | Admitting: Internal Medicine

## 2012-02-05 VITALS — BP 123/73 | HR 52 | Temp 97.9°F | Resp 6 | Ht 63.0 in | Wt 200.0 lb

## 2012-02-05 DIAGNOSIS — Z1211 Encounter for screening for malignant neoplasm of colon: Secondary | ICD-10-CM

## 2012-02-05 MED ORDER — SODIUM CHLORIDE 0.9 % IV SOLN: 500.0000 mL | INTRAVENOUS | Status: DC

## 2012-02-05 NOTE — Patient Instructions (Addendum)
The colonoscopy was normal!  Next routine exam in 10 years - 2023.  Thank you for choosing me and [facility].  Iva Boop, MD, FACG YOU HAD AN ENDOSCOPIC PROCEDURE TODAY AT THE [facility]: Refer to the procedure report that was given to you for any specific questions about what was found during the examination.  If the procedure report does not answer your questions, please call your gastroenterologist to clarify.  If you requested that your care partner not be given the details of your procedure findings, then the procedure report has been included in a sealed envelope for you to review at your convenience later.  YOU SHOULD EXPECT: Some feelings of bloating in the abdomen. Passage of more gas than usual.  Walking can help get rid of the air that was put into your GI tract during the procedure and reduce the bloating. If you had a lower endoscopy (such as a colonoscopy or flexible sigmoidoscopy) you may notice spotting of blood in your stool or on the toilet paper. If you underwent a bowel prep for your procedure, then you may not have a normal bowel movement for a few days.  DIET: Your first meal following the procedure should be a light meal and then it is ok to progress to your normal diet.  A half-sandwich or bowl of soup is an example of a good first meal.  Heavy or fried foods are harder to digest and may make you feel nauseous or bloated.  Likewise meals heavy in dairy and vegetables can cause extra gas to form and this can also increase the bloating.  Drink plenty of fluids but you should avoid alcoholic beverages for 24 hours.  ACTIVITY: Your care partner should take you home directly after the procedure.  You should plan to take it easy, moving slowly for the rest of the day.  You can resume normal activity the day after the procedure however you should NOT DRIVE or use heavy machinery for 24 hours (because of the sedation medicines used during the test).     SYMPTOMS TO REPORT IMMEDIATELY: A gastroenterologist can be reached at any hour.  During normal business hours, 8:30 AM to 5:00 PM Monday through Friday, call [phone].  After hours and on weekends, please call the GI answering service at [phone] who will take a message and have the physician on call contact you.   Following lower endoscopy (colonoscopy or flexible sigmoidoscopy):  Excessive amounts of blood in the stool  Significant tenderness or worsening of abdominal pains  Swelling of the abdomen that is new, acute  Fever of 100F or higher  FOLLOW UP: If any biopsies were taken you will be contacted by phone or by letter within the next 1-3 weeks.  Call your gastroenterologist if you have not heard about the biopsies in 3 weeks.  Our staff will call the home number listed on your records the next business day following your procedure to check on you and address any questions or concerns that you may have at that time regarding the information given to you following your procedure. This is a courtesy call and so if there is no answer at the home number and we have not heard from you through the emergency physician on call, we will assume that you have returned to your regular daily activities without incident.  SIGNATURES/CONFIDENTIALITY: You and/or your care partner have signed paperwork which will be entered into your electronic medical record.  These signatures  attest to the fact that that the information above on your After Visit Summary has been reviewed and is understood.  Full responsibility of the confidentiality of thiYOU HAD AN ENDOSCOPIC PROCEDURE TODAY AT THE [facility]: Refer to the procedure report that was given to you for any specific questions about what was found during the examination.  If the procedure report does not answer your questions, please call your gastroenterologist to clarify.  If you requested that your care partner not be given the  details of your procedure findings, then the procedure report has been included in a sealed envelope for you to review at your convenience later.

## 2012-02-05 NOTE — Progress Notes (Addendum)
Patient did not have preoperative order for IV antibiotic SSI prophylaxis. (G8918)  Patient did not experience any of the following events: a burn prior to discharge; a fall within the facility; wrong site/side/patient/procedure/implant event; or a hospital transfer or hospital admission upon discharge from the facility. (G8907)  

## 2012-02-05 NOTE — Progress Notes (Signed)
1420 a/ox3 pleased report to [employer]

## 2012-02-05 NOTE — Op Note (Signed)
[facility] [address] [place], [address]   COLONOSCOPY PROCEDURE REPORT  PATIENT: Amy Hart, Amy Hart  MR#: [id number] BIRTHDATE: 05-28-1960 , 51  yrs. old GENDER: Female ENDOSCOPIST: Iva Boop, MD, [facility] REFERRED BY:Amy Michelle Nasuti, M.D. PROCEDURE DATE:  02/05/2012 PROCEDURE:   Colonoscopy, diagnostic ASA CLASS:   Class II INDICATIONS:average risk screening. MEDICATIONS: Propofol (Diprivan) 260 mg IV, MAC sedation, administered by CRNA, and These medications were titrated to patient response per physician's verbal order  DESCRIPTION OF PROCEDURE:   After the risks benefits and alternatives of the procedure were thoroughly explained, informed consent was obtained.  A digital rectal exam revealed no abnormalities of the rectum.   The LB CF-[id number]  endoscope was introduced through the anus and advanced to the cecum, which was identified by both the appendix and ileocecal valve. No adverse events experienced.   The quality of the prep was Suprep excellent The instrument was then slowly withdrawn as the colon was fully examined.      COLON FINDINGS: A normal appearing cecum, ileocecal valve, and appendiceal orifice were identified.  The ascending, hepatic flexure, transverse, splenic flexure, descending, sigmoid colon and rectum appeared unremarkable.  No polyps or cancers were seen. Retroflexed views revealed no abnormalities. The time to cecum=6 minutes 06 seconds.  Withdrawal time=7 minutes 22 seconds.  The scope was withdrawn and the procedure completed. COMPLICATIONS: There were no complications.  ENDOSCOPIC IMPRESSION: Normal colon - excellent prep  RECOMMENDATIONS: Repeat Colonscopy in 10 years.   eSigned:  Iva Boop, MD, [facility] 02/05/2012 2:20 PM   cc: Excell Seltzer, MD and The Patient

## 2012-02-06 ENCOUNTER — Telehealth: Payer: Self-pay

## 2012-02-06 NOTE — Telephone Encounter (Signed)
  Follow up Call-  Call back number 02/05/2012  Post procedure Call Back phone  # [phone]  Permission to leave phone message Yes     Patient questions:  Do you have a fever, pain , or abdominal swelling? no Pain Score  0 *  Have you tolerated food without any problems? yes  Have you been able to return to your normal activities? yes  Do you have any questions about your discharge instructions: Diet   no Medications  no Follow up visit  no  Do you have questions or concerns about your Care? no  Actions: * If pain score is 4 or above: No action needed, pain <4.

## 2012-07-13 ENCOUNTER — Ambulatory Visit (INDEPENDENT_AMBULATORY_CARE_PROVIDER_SITE_OTHER): Payer: BC Managed Care – PPO | Admitting: Family Medicine

## 2012-07-13 ENCOUNTER — Encounter: Payer: Self-pay | Admitting: Family Medicine

## 2012-07-13 ENCOUNTER — Encounter: Payer: Self-pay | Admitting: *Deleted

## 2012-07-13 VITALS — BP 120/84 | HR 77 | Temp 98.1°F | Ht 63.0 in | Wt 210.5 lb

## 2012-07-13 DIAGNOSIS — J019 Acute sinusitis, unspecified: Secondary | ICD-10-CM | POA: Insufficient documentation

## 2012-07-13 MED ORDER — DOXYCYCLINE HYCLATE 100 MG PO TABS: 100.0000 mg | ORAL_TABLET | Freq: Two times a day (BID) | ORAL | Status: DC

## 2012-07-13 NOTE — Assessment & Plan Note (Signed)
Doubt lung infection but given on HUmira.. Will treat with broadened antibiotics, doxycycline to cover bronchitis and atypical. Hold Humira. Cotinue mucolytics.

## 2012-07-13 NOTE — Progress Notes (Signed)
  Subjective:    Patient ID: Amy Hart, female    DOB: 1960-07-01, 52 y.o.   MRN: [id number]  HPI 52 year old female with history of seasonal allergic rhinitis presents with sinus symptoms.  3/10 began having cold symptoms. No fever.  She is on Humira for RA. Held this.  Symptoms resolved almost completely, except for congestion. Started back on Humira on 3/30.   4/4 Her symptoms have returned. Nasal congestion, chest heaviness. Cough, productive. No SOB or wheeze.  Greenish nasal discharge now. Sinus pressure and headache.  No ear pain, mainly ear pressure, decreased hearing B.  No fever, occ chills. She just got back from [place].  Has used Nyquil, Dayquil.    Review of Systems  Constitutional: Negative for fever and fatigue.  HENT: Negative for ear pain.   Eyes: Negative for pain.  Respiratory: Negative for chest tightness and shortness of breath.   Cardiovascular: Negative for chest pain, palpitations and leg swelling.  Gastrointestinal: Negative for abdominal pain.  Genitourinary: Negative for dysuria.       Objective:   Physical Exam  Constitutional: Vital signs are normal. She appears well-developed and well-nourished. She is cooperative.  Non-toxic appearance. She does not appear ill. No distress.  HENT:  Head: Normocephalic.  Right Ear: Hearing, external ear and ear canal normal. Tympanic membrane is injected. Tympanic membrane is not retracted and not bulging. A middle ear effusion is present.  Left Ear: Hearing, external ear and ear canal normal. Tympanic membrane is injected. Tympanic membrane is not retracted and not bulging. A middle ear effusion is present.  Nose: Mucosal edema and rhinorrhea present. Right sinus exhibits maxillary sinus tenderness. Right sinus exhibits no frontal sinus tenderness. Left sinus exhibits maxillary sinus tenderness. Left sinus exhibits no frontal sinus tenderness.  Mouth/Throat: Uvula is midline, oropharynx is clear and moist and  mucous membranes are normal.  Eyes: Conjunctivae, EOM and lids are normal. Pupils are equal, round, and reactive to light. No foreign bodies found.  Neck: Trachea normal and normal range of motion. Neck supple. Carotid bruit is not present. No mass and no thyromegaly present.  Cardiovascular: Normal rate, regular rhythm, S1 normal, S2 normal, normal heart sounds, intact distal pulses and normal pulses.  Exam reveals no gallop and no friction rub.   No murmur heard. Pulmonary/Chest: Effort normal and breath sounds normal. Not tachypneic. No respiratory distress. She has no decreased breath sounds. She has no wheezes. She has no rales.  occ exp rhonchi in right lower lung... Cleared with cough... Likely upper airway noise  Neurological: She is alert.  Skin: Skin is warm, dry and intact. No rash noted.  Psychiatric: Her speech is normal and behavior is normal. Judgment normal. Her mood appears not anxious. Cognition and memory are normal. She does not exhibit a depressed mood.          Assessment & Plan:

## 2012-07-13 NOTE — Patient Instructions (Addendum)
Complete antibiotics. Hold Humira. Rest, hydration. Call if any shortness of breath, fever on antibiotics or if symptoms are not improving significantly in 48-72 hours.

## 2012-07-15 ENCOUNTER — Telehealth: Payer: Self-pay | Admitting: Family Medicine

## 2012-07-15 MED ORDER — AMOXICILLIN-POT CLAVULANATE 875-125 MG PO TABS: 1.0000 | ORAL_TABLET | Freq: Two times a day (BID) | ORAL | Status: DC

## 2012-07-15 NOTE — Telephone Encounter (Signed)
Will change antibotics to augmentin

## 2012-07-15 NOTE — Telephone Encounter (Signed)
Patient advised.

## 2012-07-15 NOTE — Telephone Encounter (Signed)
Patient Information:  Caller Name: Phoebe  Phone: [phone]  Patient: Amy Hart, Amy Hart  Gender: Female  DOB: 20-Aug-1960  Age: 52 Years  PCP: Kerby Nora (Family Practice)  Pregnant: No  Office Follow Up:  Does the office need to follow up with this patient?: Yes  Instructions For The Office: Follow up call; patient not improved within 48 hours, and is calling to notify Dr. Ermalene Searing as instructed krs/can  RN Note:  States it "sort of felt like the left ear was draining deep inside," but no fluid noted in the external canal.  Afebrile.  States is not worse, but has not noted improvement at all.  Per protocol, emergent symptoms denied; disposition See Today in OFfice.  Patient has been evaluated and is on antibiotics; info to office for provider review/Rx change/callback.  May reach patient at [phone].  krs/can.  Symptoms  Reason For Call & Symptoms: diagnosed with BOM 07/13/12 and told to call back if not "drastically improved in 48 hours," to call back.  Has had 5 doses of antibiotic thus far, and is not improved at all.  Reviewed Health History In EMR: Yes  Reviewed Medications In EMR: Yes  Reviewed Allergies In EMR: Yes  Reviewed Surgeries / Procedures: Yes  Date of Onset of Symptoms: Unknown OB / GYN:  LMP: Unknown  Guideline(s) Used:  Earache  Disposition Per Guideline:   See Today in Office  Reason For Disposition Reached:   All other earaches (Exceptions: earache lasting < 1 hour, and earache from air travel)  Advice Given:  N/A  RN Overrode Recommendation:  Document Patient  Follow up call; patient not improved within 48 hours, and is calling to notify Dr. Ermalene Searing as instructed krs/can

## 2012-08-16 ENCOUNTER — Encounter: Payer: Self-pay | Admitting: Family Medicine

## 2012-08-16 ENCOUNTER — Ambulatory Visit (INDEPENDENT_AMBULATORY_CARE_PROVIDER_SITE_OTHER): Payer: BC Managed Care – PPO | Admitting: Family Medicine

## 2012-08-16 VITALS — BP 118/72 | HR 56 | Temp 98.4°F | Wt 209.0 lb

## 2012-08-16 DIAGNOSIS — H6982 Other specified disorders of Eustachian tube, left ear: Secondary | ICD-10-CM

## 2012-08-16 DIAGNOSIS — H6980 Other specified disorders of Eustachian tube, unspecified ear: Secondary | ICD-10-CM

## 2012-08-16 NOTE — Patient Instructions (Addendum)
Sudafed over the counter Afrin nasal spray - 2 sprays each nostril twice a day. (Max 4 days in a row.)

## 2012-08-16 NOTE — Progress Notes (Signed)
[facility] conservation officer at [facility] [address] [place] [address] Phone: [phone] Fax: [phone]  Date:  08/16/2012   Name:  Amy Hart   DOB:  Mar 05, 1961   MRN:  [id number] Gender: female Age: 52 y.o.  Primary Physician:  Kerby Nora, MD  Evaluating MD: Hannah Beat, MD   Chief Complaint: Otalgia   History of Present Illness:  Amy Hart is a 52 y.o. pleasant patient who presents with the following:  52 year old and had bilateral OM, and now her left OM is still hurting and will have a discomfort and wind will other her. Some muffling of sound. Mild discomfort.  Patient Active Problem List   Diagnosis Date Noted  . Acute sinus infection 07/13/2012  . Rheumatoid arthritis on methotrexate and Humira 01/02/2012  . PURE HYPERCHOLESTEROLEMIA 08/29/2009  . 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 ROTATOR CUFF SYNDROME 08/04/2007  . HYPOTHYROIDISM 05/19/2007  . ALLERGIC RHINITIS, SEASONAL 05/19/2007    Past Medical History  Diagnosis Date  . Allergic rhinitis due to pollen   . Unspecified hypothyroidism   . Atrial fibrillation   . Rheumatoid arthritis     Past Surgical History  Procedure Laterality Date  . Treatment fistula anal  1993    repair  . Fracture surgery  01/01    right elbow  . Endometrial ablation  2005  . Breast biopsy  2003    right, negative  . Rotator cuff repair  06/2008    R. Cuff tear  . Ladona Ridgel bunionectomy      right    History   Social History  . Marital Status: Single    Spouse Name: N/A    Number of Children: 0  . Years of Education: N/A   Occupational History  . Purchasing at [employer]    Social History Main Topics  . Smoking status: Never Smoker   . Smokeless tobacco: Never Used  . Alcohol Use: 1.0 oz/week    2 drink(s) per week  . Drug Use: No  . Sexually Active: Not on file   Other Topics Concern  . Not on file   Social History Narrative   No exercise   Diet: Moderate   She has a  Social research officer, government   Lives with her partner    Family History  Problem Relation Age of Onset  . Hypertension Mother   . Thyroid disease Mother   . Nephrolithiasis Mother   . Diabetes Father     Allergies  Allergen Reactions  . Naproxen     REACTION: unspecified    Medication list has been reviewed and updated.  Outpatient Prescriptions Prior to Visit  Medication Sig Dispense Refill  . fish oil-omega-3 fatty acids 1000 MG capsule Take 2 g by mouth daily.      . Glucosamine-Chondroitin 1500-1200 MG/30ML LIQD Take as directed       . levothyroxine (SYNTHROID, LEVOTHROID) 100 MCG tablet Take 1 tablet (100 mcg total) by mouth daily.  30 tablet  11  . methotrexate (RHEUMATREX) 2.5 MG tablet       . Multiple Vitamin (MULTIVITAMIN) tablet Take 1 tablet by mouth daily.        . Nutritional Supplements (ESTROVEN PO) Take 1 capsule by mouth daily.        . vitamin C (ASCORBIC ACID) 500 MG tablet Take 500 mg by mouth daily.        Marland Kitchen HUMIRA PEN 40 MG/0.8ML injection Inject 40 mg into the  skin every 14 (fourteen) days.       Marland Kitchen amoxicillin-clavulanate (AUGMENTIN) 875-125 MG per tablet Take 1 tablet by mouth 2 (two) times daily.  20 tablet  0  . predniSONE (DELTASONE) 10 MG tablet Take 10 mg by mouth daily.       No facility-administered medications prior to visit.    Review of Systems:  ROS: GEN: Acute illness details above GI: Tolerating PO intake GU: maintaining adequate hydration and urination Pulm: No SOB Interactive and getting along well at home.  Otherwise, ROS is as per the HPI.   Physical Examination: BP 118/72  Pulse 56  Temp(Src) 98.4 F (36.9 C) (Oral)  Wt 209 lb (94.802 kg)  BMI 37.03 kg/m2  SpO2 97%  Ideal Body Weight:     Gen: WDWN, NAD; A & O x3, cooperative. Pleasant.Globally Non-toxic HEENT: Normocephalic and atraumatic. Throat clear, w/o exudate, R TM CLEAR, L TM SEROUS FLUID Frontal sinuses: NT Max sinuses: NT NECK: Anterior cervical  LAD is  absent CV: RRR, No M/G/R, cap refill <2 sec EXT: No c/c/e PSYCH: Friendly, good eye contact MSK: Nml gait    Assessment and Plan: Eustachian tube dysfunction, left   Refer to the patient instructions sections for details of plan shared with patient.  Supportive care  Signed, Elpidio Galea. Trenell Moxey, MD 08/16/2012 3:22 PM

## 2012-09-06 ENCOUNTER — Telehealth: Payer: Self-pay | Admitting: *Deleted

## 2012-09-06 MED ORDER — EPINEPHRINE 0.3 MG/0.3ML IJ SOAJ: 0.3000 mg | Freq: Once | INTRAMUSCULAR | Status: DC

## 2012-09-06 NOTE — Telephone Encounter (Signed)
done

## 2012-09-06 NOTE — Telephone Encounter (Signed)
Patient called stating that she had an allergic reaction this weekend and took Benadryl which took care of the reaction. Patient states that she has an old epi pen that has expired and requested that you send a new script to her pharmacy for 2 pens. Pharmacy [facility].

## 2012-12-15 ENCOUNTER — Other Ambulatory Visit: Payer: Self-pay | Admitting: Family Medicine

## 2012-12-15 MED ORDER — LEVOTHYROXINE SODIUM 100 MCG PO TABS: 100.0000 ug | ORAL_TABLET | Freq: Every day | ORAL | Status: DC

## 2013-06-10 ENCOUNTER — Other Ambulatory Visit (HOSPITAL_COMMUNITY)
Admission: RE | Admit: 2013-06-10 | Discharge: 2013-06-10 | Disposition: A | Discharge status: Home / Self Care | Payer: BC Managed Care – PPO | Source: Ambulatory Visit | Attending: Family Medicine | Admitting: Family Medicine

## 2013-06-10 ENCOUNTER — Encounter: Payer: Self-pay | Admitting: Family Medicine

## 2013-06-10 ENCOUNTER — Ambulatory Visit (INDEPENDENT_AMBULATORY_CARE_PROVIDER_SITE_OTHER): Payer: BC Managed Care – PPO | Admitting: Family Medicine

## 2013-06-10 VITALS — BP 110/84 | HR 72 | Temp 98.0°F | Ht 64.0 in | Wt 216.8 lb

## 2013-06-10 DIAGNOSIS — Z124 Encounter for screening for malignant neoplasm of cervix: Secondary | ICD-10-CM

## 2013-06-10 DIAGNOSIS — Z0000 Encounter for general adult medical examination without abnormal findings: Secondary | ICD-10-CM

## 2013-06-10 DIAGNOSIS — Z01419 Encounter for gynecological examination (general) (routine) without abnormal findings: Secondary | ICD-10-CM | POA: Insufficient documentation

## 2013-06-10 DIAGNOSIS — Z23 Encounter for immunization: Secondary | ICD-10-CM

## 2013-06-10 DIAGNOSIS — E7800 Pure hypercholesterolemia, unspecified: Secondary | ICD-10-CM

## 2013-06-10 DIAGNOSIS — Z1151 Encounter for screening for human papillomavirus (HPV): Secondary | ICD-10-CM | POA: Insufficient documentation

## 2013-06-10 DIAGNOSIS — E039 Hypothyroidism, unspecified: Secondary | ICD-10-CM

## 2013-06-10 LAB — LIPID PANEL
CHOL/HDL RATIO: 2.6 ratio
CHOLESTEROL: 217 mg/dL — AB (ref 0–200)
HDL: 85 mg/dL (ref >39)
LDL Cholesterol: 118 mg/dL — ABNORMAL HIGH (ref 0–99)
Triglycerides: 71 mg/dL (ref <150)
VLDL: 14 mg/dL (ref 0–40)

## 2013-06-10 LAB — COMPREHENSIVE METABOLIC PANEL
ALT: 38 U/L — AB (ref 0–35)
AST: 30 U/L (ref 0–37)
Albumin: 4.5 g/dL (ref 3.5–5.2)
Alkaline Phosphatase: 68 U/L (ref 39–117)
BILIRUBIN TOTAL: 0.6 mg/dL (ref 0.2–1.2)
BUN: 12 mg/dL (ref 6–23)
CO2: 26 meq/L (ref 19–32)
CREATININE: 0.66 mg/dL (ref 0.50–1.10)
Calcium: 9.3 mg/dL (ref 8.4–10.5)
Chloride: 105 mEq/L (ref 96–112)
Glucose, Bld: 100 mg/dL — ABNORMAL HIGH (ref 70–99)
Potassium: 4.5 mEq/L (ref 3.5–5.3)
Sodium: 142 mEq/L (ref 135–145)
Total Protein: 7 g/dL (ref 6.0–8.3)

## 2013-06-10 NOTE — Progress Notes (Signed)
Pre visit review using our clinic review tool, if applicable. No additional management support is needed unless otherwise documented below in the visit note. 

## 2013-06-10 NOTE — Progress Notes (Signed)
HPI  The patient is here for annual wellness exam and preventative care.   Elevated Cholesterol: Due for re-eval. Lab Results  Component Value Date   CHOL 228* 11/25/2011   HDL 80.00 11/25/2011   LDLCALC 103* 10/29/2010   LDLDIRECT 131.3 11/25/2011   TRIG 84.0 11/25/2011   CHOLHDL 3 11/25/2011  Using medications without problems:on fish oil  Muscle aches: None  Diet compliance: Moderate, improving Exercise:None , trying to get back to running.  Other complaints:  Wt Readings from Last 3 Encounters:  06/10/13 216 lb 12 oz (98.317 kg)  08/16/12 209 lb (94.802 kg)  07/13/12 210 lb 8 oz (95.482 kg)     Hypothyroid: Due for re-eval.  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 Prednisone at higher dose caused anger issues.   Followed by Dr. Charlestine Night. She has had frequent colds on humira.  Review of Systems  Constitutional: Negative for fever, fatigue and unexpected weight change.  HENT: Negative for ear pain, congestion, sore throat, sneezing, trouble swallowing and sinus pressure.  Eyes: Negative for pain and itching.  Respiratory: Negative for cough, shortness of breath and wheezing.  Cardiovascular: Negative for chest pain, palpitations and leg swelling.  Gastrointestinal: Negative for nausea, abdominal pain, diarrhea, constipation and blood in stool.  Genitourinary: Negative for dysuria, hematuria, vaginal discharge, difficulty urinating and menstrual problem.  Skin: Negative for rash.  Neurological: Negative for syncope, weakness, light-headedness, numbness and headaches.  Psychiatric/Behavioral: Negative for confusion and dysphoric mood. The patient is not nervous/anxious.  Objective:   Physical Exam  Constitutional: Vital signs are normal. She appears well-developed and well-nourished. She is cooperative. Non-toxic appearance. She does not appear ill. No distress.   HENT:  Head: Normocephalic.  Right Ear: Hearing, tympanic membrane, external ear and ear canal normal.  Left Ear: Hearing, tympanic membrane, external ear and ear canal normal.  Nose: Nose normal.  Eyes: Conjunctivae, EOM and lids are normal. Pupils are equal, round, and reactive to light. No foreign bodies found.  Neck: Trachea normal and normal range of motion. Neck supple. Carotid bruit is not present. No mass and no thyromegaly present.  Cardiovascular: Normal rate, regular rhythm, S1 normal, S2 normal, normal heart sounds and intact distal pulses. Exam reveals no gallop.  No murmur heard.  Pulmonary/Chest: Effort normal and breath sounds normal. No respiratory distress. She has no wheezes. She has no rhonchi. She has no rales.  Abdominal: Soft. Normal appearance and bowel sounds are normal. She exhibits no distension, no fluid wave, no abdominal bruit and no mass. There is no hepatosplenomegaly. There is no tenderness. There is no rebound, no guarding and no CVA tenderness. No hernia.  Genitourinary: Vagina normal and uterus normal. No breast swelling, tenderness, discharge or bleeding. Pelvic exam was performed with patient prone. There is no rash, tenderness, lesion or injury on the right labia. There is no rash, tenderness, lesion or injury on the left labia. Uterus is not deviated, not enlarged, not fixed and not tender. Cervix exhibits no motion tenderness, no discharge and no friability. Right adnexum displays no mass, no tenderness and no fullness. Left adnexum displays no mass, no tenderness and no fullness. No erythema, tenderness or bleeding around the vagina. No foreign body around the vagina. No signs of injury around the vagina. No vaginal discharge found.  PAP performed Lymphadenopathy:  She has no cervical adenopathy.  She has no axillary  adenopathy.  Neurological: She is alert. She has normal strength. No cranial nerve deficit or sensory deficit.  Skin: Skin is warm, dry and  intact. No rash noted.  Psychiatric: Her speech is normal and behavior is normal. Judgment normal. Her mood appears not anxious. Cognition and memory are normal. She does not exhibit a depressed mood.  Assessment & Plan:   The patient's preventative maintenance and recommended screening tests for an annual wellness exam were reviewed in full today.  Brought up to date unless services declined.  Counselled on the importance of diet, exercise, and its role in overall health and mortality.  The patient's FH and SH was reviewed, including their home life, tobacco status, and drug and alcohol status.   Vaccines:uptodate with td.  Per pt rheum recommends shingles vaccine... Will call his office to make sure this is the case given it is live attenuated vaccine. Nonsmoker.  PAP/DVE: Q 3 year, last done 2011,due now Mammo:due nml 11/2011 Colon: 01/2012 Dr. Carlean Purl, nml repeat in 10 years

## 2013-06-10 NOTE — Addendum Note (Signed)
Addended by: Carter Kitten on: 06/10/2013 02:53 PM   Modules accepted: Orders

## 2013-06-10 NOTE — Patient Instructions (Addendum)
Check with insurance on shingles vaccine coverage, we will verfiy with D.r truslow about safety on Humira.  Stop at lab on way out for routine labs and mmr titer.  Call to schedule mammogram on your own. Work on  exercise, weight loss, healthy eating habits.

## 2013-06-11 LAB — RUBEOLA ANTIBODY IGG

## 2013-06-11 LAB — TSH: TSH: 0.175 u[IU]/mL — ABNORMAL LOW (ref 0.350–4.500)

## 2013-06-15 ENCOUNTER — Encounter: Payer: Self-pay | Admitting: *Deleted

## 2013-06-28 ENCOUNTER — Encounter: Payer: Self-pay | Admitting: Family Medicine

## 2013-07-14 ENCOUNTER — Other Ambulatory Visit: Payer: Self-pay | Admitting: *Deleted

## 2013-07-14 MED ORDER — LEVOTHYROXINE SODIUM 100 MCG PO TABS
100.0000 ug | ORAL_TABLET | Freq: Every day | ORAL | Status: DC
Start: 2013-07-14 — End: 2013-11-14

## 2013-09-09 ENCOUNTER — Ambulatory Visit (INDEPENDENT_AMBULATORY_CARE_PROVIDER_SITE_OTHER): Payer: BC Managed Care – PPO

## 2013-09-09 ENCOUNTER — Other Ambulatory Visit (INDEPENDENT_AMBULATORY_CARE_PROVIDER_SITE_OTHER): Payer: BC Managed Care – PPO

## 2013-09-09 DIAGNOSIS — E7800 Pure hypercholesterolemia, unspecified: Secondary | ICD-10-CM

## 2013-09-09 DIAGNOSIS — E039 Hypothyroidism, unspecified: Secondary | ICD-10-CM

## 2013-09-09 DIAGNOSIS — Z2911 Encounter for prophylactic immunotherapy for respiratory syncytial virus (RSV): Secondary | ICD-10-CM

## 2013-09-09 DIAGNOSIS — Z23 Encounter for immunization: Secondary | ICD-10-CM

## 2013-09-09 LAB — T3, FREE: T3, Free: 3.1 pg/mL (ref 2.3–4.2)

## 2013-09-09 LAB — T4, FREE: Free T4: 0.83 ng/dL (ref 0.60–1.60)

## 2013-09-09 LAB — TSH: TSH: 0.14 u[IU]/mL — AB (ref 0.35–4.50)

## 2013-09-09 NOTE — Progress Notes (Signed)
Pt has instructions from Dr Charlestine Night for taking Humira and MTX which are scanned into chart and pt received copy to know when to restart Humira and MTX. Pt's Humira last taken on 08/14/13 and Last MTX was 10/01/13 and 10/02/13. Pt received injection w/o problem.pt signed waiver of liabilty form which was sent for scanning.

## 2013-11-14 ENCOUNTER — Other Ambulatory Visit: Payer: Self-pay | Admitting: *Deleted

## 2013-11-14 MED ORDER — LEVOTHYROXINE SODIUM 100 MCG PO TABS: 100.0000 ug | ORAL_TABLET | Freq: Every day | ORAL | Status: DC

## 2014-03-15 ENCOUNTER — Other Ambulatory Visit: Payer: Self-pay | Admitting: *Deleted

## 2014-03-15 MED ORDER — LEVOTHYROXINE SODIUM 100 MCG PO TABS: 100.0000 ug | ORAL_TABLET | Freq: Every day | ORAL | Status: DC

## 2014-04-10 ENCOUNTER — Ambulatory Visit (INDEPENDENT_AMBULATORY_CARE_PROVIDER_SITE_OTHER): Payer: BLUE CROSS/BLUE SHIELD | Admitting: Internal Medicine

## 2014-04-10 ENCOUNTER — Encounter: Payer: Self-pay | Admitting: Internal Medicine

## 2014-04-10 VITALS — BP 120/78 | HR 96 | Temp 98.1°F | Wt 221.0 lb

## 2014-04-10 DIAGNOSIS — J069 Acute upper respiratory infection, unspecified: Secondary | ICD-10-CM

## 2014-04-10 MED ORDER — AZITHROMYCIN 250 MG PO TABS: ORAL_TABLET | ORAL | Status: DC

## 2014-04-10 NOTE — Progress Notes (Signed)
Pre visit review using our clinic review tool, if applicable. No additional management support is needed unless otherwise documented below in the visit note. 

## 2014-04-10 NOTE — Patient Instructions (Signed)
Upper Respiratory Infection, Adult An upper respiratory infection (URI) is also sometimes known as the common cold. The upper respiratory tract includes the nose, sinuses, throat, trachea, and bronchi. Bronchi are the airways leading to the lungs. Most people improve within 1 week, but symptoms can last up to 2 weeks. A residual cough may last even longer.  CAUSES Many different viruses can infect the tissues lining the upper respiratory tract. The tissues become irritated and inflamed and often become very moist. Mucus production is also common. A cold is contagious. You can easily spread the virus to others by oral contact. This includes kissing, sharing a glass, coughing, or sneezing. Touching your mouth or nose and then touching a surface, which is then touched by another person, can also spread the virus. SYMPTOMS  Symptoms typically develop 1 to 3 days after you come in contact with a cold virus. Symptoms vary from person to person. They may include:  Runny nose.  Sneezing.  Nasal congestion.  Sinus irritation.  Sore throat.  Loss of voice (laryngitis).  Cough.  Fatigue.  Muscle aches.  Loss of appetite.  Headache.  Low-grade fever. DIAGNOSIS  You might diagnose your own cold based on familiar symptoms, since most people get a cold 2 to 3 times a year. Your caregiver can confirm this based on your exam. Most importantly, your caregiver can check that your symptoms are not due to another disease such as strep throat, sinusitis, pneumonia, asthma, or epiglottitis. Blood tests, throat tests, and X-rays are not necessary to diagnose a common cold, but they may sometimes be helpful in excluding other more serious diseases. Your caregiver will decide if any further tests are required. RISKS AND COMPLICATIONS  You may be at risk for a more severe case of the common cold if you smoke cigarettes, have chronic heart disease (such as heart failure) or lung disease (such as asthma), or if  you have a weakened immune system. The very young and very old are also at risk for more serious infections. Bacterial sinusitis, middle ear infections, and bacterial pneumonia can complicate the common cold. The common cold can worsen asthma and chronic obstructive pulmonary disease (COPD). Sometimes, these complications can require emergency medical care and may be life-threatening. PREVENTION  The best way to protect against getting a cold is to practice good hygiene. Avoid oral or hand contact with people with cold symptoms. Wash your hands often if contact occurs. There is no clear evidence that vitamin C, vitamin E, echinacea, or exercise reduces the chance of developing a cold. However, it is always recommended to get plenty of rest and practice good nutrition. TREATMENT  Treatment is directed at relieving symptoms. There is no cure. Antibiotics are not effective, because the infection is caused by a virus, not by bacteria. Treatment may include:  Increased fluid intake. Sports drinks offer valuable electrolytes, sugars, and fluids.  Breathing heated mist or steam (vaporizer or shower).  Eating chicken soup or other clear broths, and maintaining good nutrition.  Getting plenty of rest.  Using gargles or lozenges for comfort.  Controlling fevers with ibuprofen or acetaminophen as directed by your caregiver.  Increasing usage of your inhaler if you have asthma. Zinc gel and zinc lozenges, taken in the first 24 hours of the common cold, can shorten the duration and lessen the severity of symptoms. Pain medicines may help with fever, muscle aches, and throat pain. A variety of non-prescription medicines are available to treat congestion and runny nose. Your caregiver   can make recommendations and may suggest nasal or lung inhalers for other symptoms.  HOME CARE INSTRUCTIONS   Only take over-the-counter or prescription medicines for pain, discomfort, or fever as directed by your  caregiver.  Use a warm mist humidifier or inhale steam from a shower to increase air moisture. This may keep secretions moist and make it easier to breathe.  Drink enough water and fluids to keep your urine clear or pale yellow.  Rest as needed.  Return to work when your temperature has returned to normal or as your caregiver advises. You may need to stay home longer to avoid infecting others. You can also use a face mask and careful hand washing to prevent spread of the virus. SEEK MEDICAL CARE IF:   After the first few days, you feel you are getting worse rather than better.  You need your caregiver's advice about medicines to control symptoms.  You develop chills, worsening shortness of breath, or brown or red sputum. These may be signs of pneumonia.  You develop yellow or brown nasal discharge or pain in the face, especially when you bend forward. These may be signs of sinusitis.  You develop a fever, swollen neck glands, pain with swallowing, or white areas in the back of your throat. These may be signs of strep throat. SEEK IMMEDIATE MEDICAL CARE IF:   You have a fever.  You develop severe or persistent headache, ear pain, sinus pain, or chest pain.  You develop wheezing, a prolonged cough, cough up blood, or have a change in your usual mucus (if you have chronic lung disease).  You develop sore muscles or a stiff neck. Document Released: 09/17/2000 Document Revised: 06/16/2011 Document Reviewed: 06/29/2013 Exit[employer] Patient Information 2015 [employer]. This information is not intended to replace advice given to you by your health care provider. Make sure you discuss any questions you have with your health care provider.  

## 2014-04-10 NOTE — Progress Notes (Signed)
HPI  Pt presents to the clinic today with c/o chest congestion and ear fullness. She reports this started 2 days ago. She has had some associated sore throat. She denies cough or shortness of breath. She has been battling these symptoms on and off since October. She has not had sick contacts that she is aware of. She does have a history of seasonal allergies- she takes local honey every day, which seems to help. She is on humira for RA- she did not take her injection on Sunday.  Review of Systems      Past Medical History  Diagnosis Date  . Allergic rhinitis due to pollen   . Unspecified hypothyroidism   . Atrial fibrillation   . Rheumatoid arthritis(714.0)     Family History  Problem Relation Age of Onset  . Hypertension Mother   . Thyroid disease Mother   . Nephrolithiasis Mother   . Diabetes Father     History   Social History  . Marital Status: Single    Spouse Name: N/A    Number of Children: 0  . Years of Education: N/A   Occupational History  . Purchasing at [employer] History Main Topics  . Smoking status: Never Smoker   . Smokeless tobacco: Never Used  . Alcohol Use: 1.0 oz/week    2 drink(s) per week  . Drug Use: No  . Sexual Activity: Not on file   Other Topics Concern  . Not on file   Social History Narrative   No exercise   Diet: Moderate   She has a Sales promotion account executive   Lives with her partner    Allergies  Allergen Reactions  . Naproxen     REACTION: unspecified     Constitutional: Positive  fatigue. Denies headache, fever or  abrupt weight changes.  HEENT:  Positive sore throat. Denies eye redness, eye pain, pressure behind the eyes, facial pain, nasal congestion, ear pain, ringing in the ears, wax buildup, runny nose or bloody nose. Respiratory: Positive chest congestion. Denies difficulty breathing or shortness of breath.  Cardiovascular: Denies chest pain, chest tightness, palpitations or swelling in the hands or  feet.   No other specific complaints in a complete review of systems (except as listed in HPI above).  Objective:   BP 120/78 mmHg  Pulse 96  Temp(Src) 98.1 F (36.7 C) (Oral)  Wt 221 lb (100.245 kg)  SpO2 98% Wt Readings from Last 3 Encounters:  04/10/14 221 lb (100.245 kg)  06/10/13 216 lb 12 oz (98.317 kg)  08/16/12 209 lb (94.802 kg)     General: Appears her stated age, well developed, well nourished in NAD. HEENT: Head: normal shape and size, no sinus tenderness noted; Eyes: sclera white, no icterus, conjunctiva pink; Ears: Tm's gray and intact, normal light reflex; Nose: mucosa pink and moist, septum midline; Throat/Mouth: Teeth present, mucosa pink and moist, no exudate noted, no lesions or ulcerations noted.  Neck: No lymphadenopathy.  Cardiovascular: Normal rate and rhythm. S1,S2 noted.  No murmur, rubs or gallops noted. Pulmonary/Chest: Normal effort and positive vesicular breath sounds. No respiratory distress. No wheezes, rales or ronchi noted.      Assessment & Plan:   Upper Respiratory Infection:  Given duration of symptoms, will go ahead and treat with azithromax x 5 days Get some rest and drink plenty of water Do salt water gargles for the sore throat She declines Rx for Hycodan cough syrup Try some zyrtec OTC in case  there is an allergy component to this  RTC as needed or if symptoms persist.

## 2014-06-14 ENCOUNTER — Other Ambulatory Visit: Payer: Self-pay | Admitting: *Deleted

## 2014-06-14 NOTE — Telephone Encounter (Signed)
Last office visit 04/10/2014 with Webb Silversmith for URI.  Last CPE 06/10/2013.  No future appointments scheduled.  Refill?

## 2014-06-15 NOTE — Telephone Encounter (Signed)
I do not  routinely prescribe this medication.. She should have a specialist that does.

## 2014-09-14 ENCOUNTER — Other Ambulatory Visit: Payer: Self-pay | Admitting: *Deleted

## 2014-09-14 MED ORDER — LEVOTHYROXINE SODIUM 100 MCG PO TABS: 100.0000 ug | ORAL_TABLET | Freq: Every day | ORAL | Status: DC

## 2014-10-16 ENCOUNTER — Other Ambulatory Visit: Payer: Self-pay | Admitting: *Deleted

## 2014-10-16 MED ORDER — LEVOTHYROXINE SODIUM 100 MCG PO TABS: 100.0000 ug | ORAL_TABLET | Freq: Every day | ORAL | Status: DC

## 2014-11-28 ENCOUNTER — Telehealth: Payer: Self-pay | Admitting: Family Medicine

## 2014-11-28 DIAGNOSIS — E7800 Pure hypercholesterolemia, unspecified: Secondary | ICD-10-CM

## 2014-11-28 DIAGNOSIS — E039 Hypothyroidism, unspecified: Secondary | ICD-10-CM

## 2014-11-28 NOTE — Telephone Encounter (Signed)
-----   Message from [name] sent at 11/22/2014 11:58 AM EDT ----- Regarding: Lab orders for Wednesday, 8.24.16 Patient is scheduled for CPX labs, please order future labs, Thanks , Karna Christmas

## 2014-11-29 ENCOUNTER — Other Ambulatory Visit (INDEPENDENT_AMBULATORY_CARE_PROVIDER_SITE_OTHER): Payer: BLUE CROSS/BLUE SHIELD

## 2014-11-29 DIAGNOSIS — E7800 Pure hypercholesterolemia, unspecified: Secondary | ICD-10-CM

## 2014-11-29 LAB — COMPREHENSIVE METABOLIC PANEL
ALBUMIN: 4.1 g/dL (ref 3.5–5.2)
ALT: 23 U/L (ref 0–35)
AST: 21 U/L (ref 0–37)
Alkaline Phosphatase: 72 U/L (ref 39–117)
BILIRUBIN TOTAL: 0.6 mg/dL (ref 0.2–1.2)
BUN: 11 mg/dL (ref 6–23)
CO2: 29 meq/L (ref 19–32)
Calcium: 9.7 mg/dL (ref 8.4–10.5)
Chloride: 105 mEq/L (ref 96–112)
Creatinine, Ser: 0.85 mg/dL (ref 0.40–1.20)
GFR: 74.18 mL/min (ref >60.00)
GLUCOSE: 101 mg/dL — AB (ref 70–99)
POTASSIUM: 4.7 meq/L (ref 3.5–5.1)
Sodium: 141 mEq/L (ref 135–145)
TOTAL PROTEIN: 7 g/dL (ref 6.0–8.3)

## 2014-11-29 LAB — LIPID PANEL
CHOL/HDL RATIO: 3
Cholesterol: 208 mg/dL — ABNORMAL HIGH (ref 0–200)
HDL: 68.4 mg/dL (ref >39.00)
LDL Cholesterol: 119 mg/dL — ABNORMAL HIGH (ref 0–99)
NONHDL: 139.87
Triglycerides: 102 mg/dL (ref 0.0–149.0)
VLDL: 20.4 mg/dL (ref 0.0–40.0)

## 2014-12-05 ENCOUNTER — Ambulatory Visit (INDEPENDENT_AMBULATORY_CARE_PROVIDER_SITE_OTHER): Payer: BLUE CROSS/BLUE SHIELD | Admitting: Family Medicine

## 2014-12-05 ENCOUNTER — Encounter: Payer: Self-pay | Admitting: Family Medicine

## 2014-12-05 VITALS — BP 124/80 | HR 85 | Temp 98.4°F | Ht 64.0 in | Wt 216.2 lb

## 2014-12-05 DIAGNOSIS — R040 Epistaxis: Secondary | ICD-10-CM | POA: Diagnosis not present

## 2014-12-05 DIAGNOSIS — E039 Hypothyroidism, unspecified: Secondary | ICD-10-CM

## 2014-12-05 DIAGNOSIS — Z0000 Encounter for general adult medical examination without abnormal findings: Secondary | ICD-10-CM | POA: Diagnosis not present

## 2014-12-05 DIAGNOSIS — N951 Menopausal and female climacteric states: Secondary | ICD-10-CM | POA: Diagnosis not present

## 2014-12-05 LAB — TSH: TSH: 0.35 u[IU]/mL (ref 0.35–4.50)

## 2014-12-05 NOTE — Patient Instructions (Addendum)
Stop at lab on way out for TSH check.  Can schedule mammogram on your own every 2 years. Can try black cohosh for  menopausal symtpoms.  if nose bleeds continue.Marland Kitchen ENT referral

## 2014-12-05 NOTE — Assessment & Plan Note (Signed)
Use humidifying gel. Stop internal nasal saline may be dislodging scab.  is recurrence continuing.. Call for ENT referral for cautery.

## 2014-12-05 NOTE — Progress Notes (Signed)
Pre visit review using our clinic review tool, if applicable. No additional management support is needed unless otherwise documented below in the visit note. 

## 2014-12-05 NOTE — Assessment & Plan Note (Signed)
Not bothersome enough for prescription med. Can try black cohosh.

## 2014-12-05 NOTE — Assessment & Plan Note (Signed)
Due for re-eval. 

## 2014-12-05 NOTE — Progress Notes (Signed)
The patient is here for annual wellness exam and preventative care.   Elevated Cholesterol:LDL at goal < 130 on no med. Lab Results  Component Value Date   CHOL 208* 11/29/2014   HDL 68.40 11/29/2014   LDLCALC 119* 11/29/2014   LDLDIRECT 131.3 11/25/2011   TRIG 102.0 11/29/2014   CHOLHDL 3 11/29/2014   Using medications without problems:on fish oil  Muscle aches: None  Diet compliance: Poor Exercise: None Other complaints:  Wt Readings from Last 3 Encounters:  12/05/14 216 lb 4 oz (98.09 kg)  04/10/14 221 lb (100.245 kg)  06/10/13 216 lb 12 oz (98.317 kg)   Hypothyroid: Due for re-eval.  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 Prednisone at higher dose caused anger issues.  Followed by Dr. Charlestine Night.  She has been having hot flashes. Hoy Register helps some.  Occ left nostril bleeding every few weeks, last 2 minutes, small amount. Using saline now several times a day.   Review of Systems  Constitutional: Negative for fever, fatigue and unexpected weight change.  HENT: Negative for ear pain, congestion, sore throat, sneezing, trouble swallowing and sinus pressure.  Eyes: Negative for pain and itching.  Respiratory: Negative for cough, shortness of breath and wheezing.  Cardiovascular: Negative for chest pain, palpitations and leg swelling.  Gastrointestinal: Negative for nausea, abdominal pain, diarrhea, constipation and blood in stool.  Genitourinary: Negative for dysuria, hematuria, vaginal discharge, difficulty urinating and menstrual problem.  Skin: Negative for rash.  Neurological: Negative for syncope, weakness, light-headedness, numbness and headaches.  Psychiatric/Behavioral: Negative for confusion and dysphoric mood. The patient is not nervous/anxious.  Objective:   Physical Exam  Constitutional:  Vital signs are normal. She appears well-developed and well-nourished. She is cooperative. Non-toxic appearance. She does not appear ill. No distress.  HENT:  Head: Normocephalic.  Right Ear: Hearing, tympanic membrane, external ear and ear canal normal.  Left Ear: Hearing, tympanic membrane, external ear and ear canal normal.  Nose: left anterior septum with blood vessel close to surface and scab.  Eyes: Conjunctivae, EOM and lids are normal. Pupils are equal, round, and reactive to light. No foreign bodies found.  Neck: Trachea normal and normal range of motion. Neck supple. Carotid bruit is not present. No mass and no thyromegaly present.  Cardiovascular: Normal rate, regular rhythm, S1 normal, S2 normal, normal heart sounds and intact distal pulses. Exam reveals no gallop.  No murmur heard.  Pulmonary/Chest: Effort normal and breath sounds normal. No respiratory distress. She has no wheezes. She has no rhonchi. She has no rales.  Abdominal: Soft. Normal appearance and bowel sounds are normal. She exhibits no distension, no fluid wave, no abdominal bruit and no mass. There is no hepatosplenomegaly. There is no tenderness. There is no rebound, no guarding and no CVA tenderness. No hernia.  Genitourinary: Vagina normal and uterus normal. No breast swelling, tenderness, discharge or bleeding. Pelvic exam was performed with patient prone. There is no rash, tenderness, lesion or injury on the right labia. There is no rash, tenderness, lesion or injury on the left labia. Uterus is not deviated, not enlarged, not fixed and not tender. Cervix exhibits no motion tenderness, no discharge and no friability. Right adnexum displays no mass, no tenderness and no fullness. Left adnexum displays no mass, no tenderness and no fullness. No erythema, tenderness or bleeding around the vagina. No  foreign body around the vagina. No signs of injury around the vagina. No vaginal discharge found.  PAP  performed Lymphadenopathy:  She has no cervical adenopathy.  She has no axillary adenopathy.  Neurological: She is alert. She has normal strength. No cranial nerve deficit or sensory deficit.  Skin: Skin is warm, dry and intact. No rash noted.  Psychiatric: Her speech is normal and behavior is normal. Judgment normal. Her mood appears not anxious. Cognition and memory are normal. She does not exhibit a depressed mood.  Assessment & Plan:   The patient's preventative maintenance and recommended screening tests for an annual wellness exam were reviewed in full today.  Brought up to date unless services declined.  Counselled on the importance of diet, exercise, and its role in overall health and mortality.  The patient's FH and SH was reviewed, including their home life, tobacco status, and drug and alcohol status.   Vaccines:uptodate with td and zoster given RA.Marland Kitchen  Nonsmoker.  PAP/DVE: Q 3 year, last done 06/2013 nml , no HPV. Mammo: nml 06/2013, no family history for breast cancer Colon: 01/2012 Dr. Carlean Purl, nml repeat in 10 years

## 2014-12-15 ENCOUNTER — Other Ambulatory Visit: Payer: Self-pay | Admitting: *Deleted

## 2014-12-15 MED ORDER — LEVOTHYROXINE SODIUM 100 MCG PO TABS: 100.0000 ug | ORAL_TABLET | Freq: Every day | ORAL | Status: DC

## 2015-01-29 ENCOUNTER — Encounter: Payer: Self-pay | Admitting: Primary Care

## 2015-01-29 ENCOUNTER — Ambulatory Visit (INDEPENDENT_AMBULATORY_CARE_PROVIDER_SITE_OTHER): Payer: BLUE CROSS/BLUE SHIELD | Admitting: Primary Care

## 2015-01-29 VITALS — BP 122/82 | HR 75 | Temp 98.0°F | Ht 64.0 in | Wt 218.8 lb

## 2015-01-29 DIAGNOSIS — R310 Gross hematuria: Secondary | ICD-10-CM | POA: Diagnosis not present

## 2015-01-29 LAB — CBC WITH DIFFERENTIAL/PLATELET
Basophils Absolute: 0 10*3/uL (ref 0.0–0.1)
Basophils Relative: 0.5 % (ref 0.0–3.0)
EOS PCT: 2.5 % (ref 0.0–5.0)
Eosinophils Absolute: 0.1 10*3/uL (ref 0.0–0.7)
HCT: 43.4 % (ref 36.0–46.0)
HEMOGLOBIN: 14.8 g/dL (ref 12.0–15.0)
LYMPHS PCT: 36.2 % (ref 12.0–46.0)
Lymphs Abs: 1.9 10*3/uL (ref 0.7–4.0)
MCHC: 34 g/dL (ref 30.0–36.0)
MCV: 96.8 fl (ref 78.0–100.0)
Monocytes Absolute: 0.5 10*3/uL (ref 0.1–1.0)
Monocytes Relative: 9 % (ref 3.0–12.0)
Neutro Abs: 2.8 10*3/uL (ref 1.4–7.7)
Neutrophils Relative %: 51.8 % (ref 43.0–77.0)
Platelets: 191 10*3/uL (ref 150.0–400.0)
RBC: 4.49 Mil/uL (ref 3.87–5.11)
RDW: 14.3 % (ref 11.5–15.5)
WBC: 5.4 10*3/uL (ref 4.0–10.5)

## 2015-01-29 LAB — POCT URINALYSIS DIPSTICK
BILIRUBIN UA: NEGATIVE
GLUCOSE UA: NEGATIVE
Ketones, UA: NEGATIVE
LEUKOCYTES UA: NEGATIVE
NITRITE UA: NEGATIVE
Protein, UA: NEGATIVE
Spec Grav, UA: 1.01
Urobilinogen, UA: NEGATIVE
pH, UA: 6.5

## 2015-01-29 LAB — BASIC METABOLIC PANEL
BUN: 17 mg/dL (ref 6–23)
CALCIUM: 9.9 mg/dL (ref 8.4–10.5)
CO2: 29 meq/L (ref 19–32)
Chloride: 103 mEq/L (ref 96–112)
Creatinine, Ser: 0.84 mg/dL (ref 0.40–1.20)
GFR: 75.15 mL/min (ref >60.00)
GLUCOSE: 114 mg/dL — AB (ref 70–99)
POTASSIUM: 4.8 meq/L (ref 3.5–5.1)
SODIUM: 139 meq/L (ref 135–145)

## 2015-01-29 NOTE — Patient Instructions (Signed)
Complete lab work prior to leaving today. I will notify you of your results.  Stop by the front and speak with Rosaria Ferries regarding your referral to Urology.  It was a pleasure meeting you!  Hematuria, Adult Hematuria is blood in your urine. It can be caused by a bladder infection, kidney infection, prostate infection, kidney stone, or cancer of your urinary tract. Infections can usually be treated with medicine, and a kidney stone usually will pass through your urine. If neither of these is the cause of your hematuria, further workup to find out the reason may be needed. It is very important that you tell your health care provider about any blood you see in your urine, even if the blood stops without treatment or happens without causing pain. Blood in your urine that happens and then stops and then happens again can be a symptom of a very serious condition. Also, pain is not a symptom in the initial stages of many urinary cancers. HOME CARE INSTRUCTIONS   Drink lots of fluid, 3-4 quarts a day. If you have been diagnosed with an infection, cranberry juice is especially recommended, in addition to large amounts of water.  Avoid caffeine, tea, and carbonated beverages because they tend to irritate the bladder.  Avoid alcohol because it may irritate the prostate.  Take all medicines as directed by your health care provider.  If you were prescribed an antibiotic medicine, finish it all even if you start to feel better.  If you have been diagnosed with a kidney stone, follow your health care provider's instructions regarding straining your urine to catch the stone.  Empty your bladder often. Avoid holding urine for long periods of time.  After a bowel movement, women should cleanse front to back. Use each tissue only once.  Empty your bladder before and after sexual intercourse if you are a female. SEEK MEDICAL CARE IF:  You develop back pain.  You have a fever.  You have a feeling of  sickness in your stomach (nausea) or vomiting.  Your symptoms are not better in 3 days. Return sooner if you are getting worse. SEEK IMMEDIATE MEDICAL CARE IF:   You develop severe vomiting and are unable to keep the medicine down.  You develop severe back or abdominal pain despite taking your medicines.  You begin passing a large amount of blood or clots in your urine.  You feel extremely weak or faint, or you pass out. MAKE SURE YOU:   Understand these instructions.  Will watch your condition.  Will get help right away if you are not doing well or get worse.   This information is not intended to replace advice given to you by your health care provider. Make sure you discuss any questions you have with your health care provider.   Document Released: 03/24/2005 Document Revised: 04/14/2014 Document Reviewed: 11/22/2012 Elsevier Interactive Patient Education [employer].

## 2015-01-29 NOTE — Progress Notes (Signed)
Subjective:    Patient ID: Amy Hart, female    DOB: [birth year], 54 y.o.   MRN: [id number]  HPI  [name] is a 54 year old female who presents today with a chief complaint of gross, painless, hematuria. She first noticed this 2 weeks ago after pressure washing the house. Her hematuria dissipated the next day and then returned Sunday (yesterday) with some mild bilateral lower groin/abdominal cramping. Denies foul odor, dysuria, fevers, chills, flank pain. She is managed on methotrexate 2.5 mg and has been taking this for over 4 years.  Review of Systems  Constitutional: Negative for fever and chills.  Respiratory: Negative for shortness of breath.   Cardiovascular: Negative for chest pain.  Gastrointestinal:       Mild bilateral groin pain  Genitourinary: Positive for hematuria. Negative for dysuria, urgency, flank pain, vaginal discharge, difficulty urinating, vaginal pain and pelvic pain.       Past Medical History  Diagnosis Date  . Allergic rhinitis due to pollen   . Unspecified hypothyroidism   . Atrial fibrillation ([place])   . Rheumatoid arthritis(714.0)     Social History   Social History  . Marital Status: Single    Spouse Name: N/A  . Number of Children: 0  . Years of Education: N/A   Occupational History  . Purchasing at [employer] History Main Topics  . Smoking status: Never Smoker   . Smokeless tobacco: Never Used  . Alcohol Use: 1.0 oz/week    2 Standard drinks or equivalent per week  . Drug Use: No  . Sexual Activity: Not on file   Other Topics Concern  . Not on file   Social History Narrative   No exercise   Diet: Moderate   She has a Sales promotion account executive   Lives with her partner    Past Surgical History  Procedure Laterality Date  . Treatment fistula anal  1993    repair  . Fracture surgery  01/01    right elbow  . Endometrial ablation  2005  . Breast biopsy  2003    right, negative  . Rotator cuff repair  06/2008      R. Cuff tear  . Lovena Le bunionectomy      right    Family History  Problem Relation Age of Onset  . Hypertension Mother   . Thyroid disease Mother   . Nephrolithiasis Mother   . Diabetes Father     Allergies  Allergen Reactions  . Naproxen     REACTION: unspecified    Current Outpatient Prescriptions on File Prior to Visit  Medication Sig Dispense Refill  . cyclobenzaprine (FLEXERIL) 10 MG tablet Take 10 mg by mouth at bedtime.    Marland Kitchen EPINEPHrine (EPIPEN) 0.3 mg/0.3 mL DEVI Inject 0.3 mLs (0.3 mg total) into the muscle once. 2 Device 0  . fish oil-omega-3 fatty acids 1000 MG capsule Take 2 g by mouth daily.    . Glucosamine-Chondroitin 1500-1200 MG/30ML LIQD Take as directed     . HUMIRA PEN 40 MG/0.8ML injection Inject 40 mg into the skin every 30 (thirty) days.     Marland Kitchen levothyroxine (SYNTHROID, LEVOTHROID) 100 MCG tablet Take 1 tablet (100 mcg total) by mouth daily. 30 tablet 11  . methotrexate (RHEUMATREX) 2.5 MG tablet     . Multiple Vitamin (MULTIVITAMIN) tablet Take 1 tablet by mouth daily.      . predniSONE (DELTASONE) 5 MG tablet Take 5 mg by mouth  as needed. prior to traveling    . traMADol (ULTRAM) 50 MG tablet Take 50 mg by mouth 2 (two) times daily as needed.   1  . vitamin C (ASCORBIC ACID) 500 MG tablet Take 500 mg by mouth daily.       No current facility-administered medications on file prior to visit.    BP 122/82 mmHg  Pulse 75  Temp(Src) 98 F (36.7 C) (Oral)  Ht 5\' 4"  (1.626 m)  Wt 218 lb 12.8 oz (99.247 kg)  BMI 37.54 kg/m2  SpO2 92%    Objective:   Physical Exam  Constitutional: She appears well-nourished.  Cardiovascular: Normal rate and regular rhythm.   Pulmonary/Chest: Effort normal and breath sounds normal.  Abdominal: Soft. Bowel sounds are normal. There is tenderness in the suprapubic area.  Mild tenderness to right groin upon deep palpation  Skin: Skin is warm and dry.          Assessment & Plan:  Gross Hematuria:  Present  for 2 weeks intermittently, gross amount of blood in toilet bowel per patient. No urinary symptoms, flank pain, vaginal symptoms. UA: 3+ blood. Negative otherwise. Will send culture, get CBC, BMP. Due to painless hematuria, will send to urology for further evaluation. Exam mostly unremarkable. Methotrexate side effect of hematuria, this is less likely the cause as she's been on this medication for 4 years.

## 2015-01-29 NOTE — Progress Notes (Signed)
Pre visit review using our clinic review tool, if applicable. No additional management support is needed unless otherwise documented below in the visit note. 

## 2015-01-30 LAB — URINE CULTURE: Colony Count: 1000

## 2015-02-06 ENCOUNTER — Ambulatory Visit (INDEPENDENT_AMBULATORY_CARE_PROVIDER_SITE_OTHER): Payer: BLUE CROSS/BLUE SHIELD | Admitting: Obstetrics and Gynecology

## 2015-02-06 ENCOUNTER — Encounter: Payer: Self-pay | Admitting: Obstetrics and Gynecology

## 2015-02-06 VITALS — BP 118/61 | HR 96 | Resp 16 | Ht 64.0 in | Wt 217.8 lb

## 2015-02-06 DIAGNOSIS — R310 Gross hematuria: Secondary | ICD-10-CM

## 2015-02-06 LAB — URINALYSIS, COMPLETE
Bilirubin, UA: NEGATIVE
Glucose, UA: NEGATIVE
Ketones, UA: NEGATIVE
Nitrite, UA: NEGATIVE
Protein, UA: NEGATIVE
Specific Gravity, UA: 1.01 (ref 1.005–1.030)
Urobilinogen, Ur: 0.2 mg/dL (ref 0.2–1.0)
pH, UA: 7 (ref 5.0–7.5)

## 2015-02-06 LAB — MICROSCOPIC EXAMINATION
Bacteria, UA: NONE SEEN
Renal Epithel, UA: NONE SEEN /HPF

## 2015-02-06 NOTE — Progress Notes (Signed)
02/06/2015 9:21 AM   Amy Hart 1960/10/01 [id number]  Referring provider: Jinny Sanders, MD [address] [place], [place] [address]  Chief Complaint  Patient presents with  . Hematuria  . Establish Care    HPI: Patient is a 54 year old female with a history of rheumatoid arthritis presenting today as a referral from her primary care provider with complaints of intermittent gross hematuria over the last 2 weeks. She reports initial episode occurred after she was pressure washing her house. Hematuria that later resolved and then began to recur intermittently with associated lower abdominal cramping. She denies any dysuria  but is experiencing some frequency. No vaginal complaints. No fevers, nausea or vomiting.  Currently on methotrexate and Humira.  Questionable history of one episode of kidney stones.    Mother and sister both have history of renal stones.  PMH: Past Medical History  Diagnosis Date  . Allergic rhinitis due to pollen   . Unspecified hypothyroidism   . Atrial fibrillation ([place])   . Rheumatoid arthritis(714.0)     Surgical History: Past Surgical History  Procedure Laterality Date  . Treatment fistula anal  1993    repair  . Fracture surgery  01/01    right elbow  . Endometrial ablation  2005  . Breast biopsy  2003    right, negative  . Rotator cuff repair  06/2008    R. Cuff tear  . Taylor bunionectomy      right    Home Medications:    Medication List       This list is accurate as of: 02/06/15  9:21 AM.  Always use your most recent med list.               cyclobenzaprine 10 MG tablet  Commonly known as:  FLEXERIL  Take 10 mg by mouth at bedtime.     EPINEPHrine 0.3 mg/0.3 mL Soaj injection  Commonly known as:  EPIPEN  Inject 0.3 mLs (0.3 mg total) into the muscle once.     fish oil-omega-3 fatty acids 1000 MG capsule  Take 2 g by mouth daily.     Glucosamine-Chondroitin 1500-1200 MG/30ML Liqd  Take as directed      HUMIRA PEN 40 MG/0.8ML injection  Generic drug:  adalimumab  Inject 40 mg into the skin every 30 (thirty) days.     levothyroxine 100 MCG tablet  Commonly known as:  SYNTHROID, LEVOTHROID  Take 1 tablet (100 mcg total) by mouth daily.     methotrexate 2.5 MG tablet  Commonly known as:  RHEUMATREX     multivitamin tablet  Take 1 tablet by mouth daily.     pantoprazole 20 MG tablet  Commonly known as:  PROTONIX  Take by mouth daily.     predniSONE 5 MG tablet  Commonly known as:  DELTASONE  Take 5 mg by mouth as needed. prior to traveling     traMADol 50 MG tablet  Commonly known as:  ULTRAM  Take 50 mg by mouth 2 (two) times daily as needed.     vitamin C 500 MG tablet  Commonly known as:  ASCORBIC ACID  Take 500 mg by mouth daily.        Allergies:  Allergies  Allergen Reactions  . Naproxen     REACTION: unspecified    Family History: Family History  Problem Relation Age of Onset  . Hypertension Mother   . Thyroid disease Mother   . Nephrolithiasis Mother   .  Diabetes Father     Social History:  reports that she has never smoked. She has never used smokeless tobacco. She reports that she drinks about 1.0 oz of alcohol per week. She reports that she does not use illicit drugs.  ROS: UROLOGY Frequent Urination?: No Hard to postpone urination?: No Burning/pain with urination?: No Get up at night to urinate?: No Leakage of urine?: No Urine stream starts and stops?: No Trouble starting stream?: No Do you have to strain to urinate?: No Blood in urine?: Yes Urinary tract infection?: No Sexually transmitted disease?: No Injury to kidneys or bladder?: No Painful intercourse?: No Weak stream?: No Currently pregnant?: No Vaginal bleeding?: No Last menstrual period?: n  Gastrointestinal Nausea?: No Vomiting?: No Indigestion/heartburn?: No Diarrhea?: No Constipation?: No  Constitutional Fever: No Night sweats?: No Weight loss?: No Fatigue?:  No  Skin Skin rash/lesions?: No Itching?: No  Eyes Blurred vision?: No Double vision?: No  Ears/Nose/Throat Sore throat?: No Sinus problems?: No  Hematologic/Lymphatic Swollen glands?: No Easy bruising?: No  Cardiovascular Leg swelling?: No Chest pain?: No  Respiratory Cough?: No Shortness of breath?: No  Endocrine Excessive thirst?: No  Musculoskeletal Back pain?: No Joint pain?: Yes  Neurological Headaches?: No Dizziness?: No  Psychologic Depression?: No Anxiety?: No  Physical Exam: BP 118/61 mmHg  Pulse 96  Resp 16  Ht 5\' 4"  (1.626 m)  Wt 217 lb 12.8 oz (98.793 kg)  BMI 37.37 kg/m2  Constitutional:  Alert and oriented, No acute distress. HEENT: [place] AT, moist mucus membranes.  Trachea midline, no masses. Cardiovascular: No clubbing, cyanosis, or edema. Respiratory: Normal respiratory effort, no increased work of breathing. GI: Abdomen is soft, nontender, nondistended, no abdominal masses GU: No CVA tenderness.  Skin: No rashes, bruises or suspicious lesions. Lymph: No cervical or inguinal adenopathy. Neurologic: Grossly intact, no focal deficits, moving all 4 extremities. Psychiatric: Normal mood and affect.  Laboratory Data:   Urinalysis    Component Value Date/Time   BILIRUBINUR neg 01/29/2015 1008   PROTEINUR neg 01/29/2015 1008   UROBILINOGEN negative 01/29/2015 1008   NITRITE neg 01/29/2015 1008   LEUKOCYTESUR Negative 01/29/2015 1008    Pertinent Imaging:   Assessment & Plan:    1. Gross hematuria-Intermittent gross hematuria x 2 weeks with associated mild lower abdominal cramping. UC negative by PCP. No smoking history. Family history of renal stones. We discussed the differential diagnosis for  Hematuria including nephrolithiasis, renal or upper tract tumors, bladder stones, UTIs, or bladder tumors as well as undetermined etiologies. Per AUA guidelines, I did recommend complete hematuria evaluation including CTU, possible urine  cytology, and office cystoscopy. - Urinalysis, Complete- WBCs 0-5, RBC 0-2, Epith 0-10 otherwise negative.  Return for CT results and cystoscopy.  These notes generated with voice recognition software. I apologize for typographical errors.  [name], [facility] [address] [place], [place] [address] [phone]

## 2015-02-06 NOTE — Patient Instructions (Signed)

## 2015-02-19 ENCOUNTER — Ambulatory Visit
Admission: RE | Admit: 2015-02-19 | Discharge: 2015-02-19 | Disposition: A | Discharge status: Home / Self Care | Payer: BLUE CROSS/BLUE SHIELD | Source: Ambulatory Visit | Attending: Obstetrics and Gynecology | Admitting: Obstetrics and Gynecology

## 2015-02-19 DIAGNOSIS — N201 Calculus of ureter: Secondary | ICD-10-CM | POA: Insufficient documentation

## 2015-02-19 DIAGNOSIS — I728 Aneurysm of other specified arteries: Secondary | ICD-10-CM | POA: Diagnosis not present

## 2015-02-19 DIAGNOSIS — R310 Gross hematuria: Secondary | ICD-10-CM

## 2015-02-19 DIAGNOSIS — N132 Hydronephrosis with renal and ureteral calculous obstruction: Secondary | ICD-10-CM | POA: Insufficient documentation

## 2015-02-19 HISTORY — DX: Systemic involvement of connective tissue, unspecified: M35.9

## 2015-02-19 MED ORDER — IOHEXOL 300 MG/ML  SOLN
125.0000 mL | Freq: Once | INTRAMUSCULAR | Status: AC | PRN
  Administered 2015-02-19: 125 mL via INTRAVENOUS

## 2015-02-21 ENCOUNTER — Ambulatory Visit (INDEPENDENT_AMBULATORY_CARE_PROVIDER_SITE_OTHER): Payer: BLUE CROSS/BLUE SHIELD | Admitting: Urology

## 2015-02-21 ENCOUNTER — Encounter: Payer: Self-pay | Admitting: Urology

## 2015-02-21 VITALS — BP 135/88 | HR 92 | Ht 64.0 in | Wt 215.8 lb

## 2015-02-21 DIAGNOSIS — R310 Gross hematuria: Secondary | ICD-10-CM | POA: Diagnosis not present

## 2015-02-21 DIAGNOSIS — I728 Aneurysm of other specified arteries: Secondary | ICD-10-CM

## 2015-02-21 DIAGNOSIS — N1330 Unspecified hydronephrosis: Secondary | ICD-10-CM

## 2015-02-21 DIAGNOSIS — N201 Calculus of ureter: Secondary | ICD-10-CM

## 2015-02-21 LAB — URINALYSIS, COMPLETE
BILIRUBIN UA: NEGATIVE
Glucose, UA: NEGATIVE
KETONES UA: NEGATIVE
Leukocytes, UA: NEGATIVE
NITRITE UA: NEGATIVE
Protein, UA: NEGATIVE
Specific Gravity, UA: 1.015 (ref 1.005–1.030)
UUROB: 0.2 mg/dL (ref 0.2–1.0)
pH, UA: 7 (ref 5.0–7.5)

## 2015-02-21 LAB — MICROSCOPIC EXAMINATION

## 2015-02-21 MED ORDER — LIDOCAINE HCL 2 % EX GEL
1.0000 "application " | Freq: Once | CUTANEOUS | Status: AC
  Administered 2015-02-21: 1 via URETHRAL

## 2015-02-21 MED ORDER — TAMSULOSIN HCL 0.4 MG PO CAPS: 0.4000 mg | ORAL_CAPSULE | Freq: Every day | ORAL | Status: DC

## 2015-02-21 MED ORDER — CIPROFLOXACIN HCL 500 MG PO TABS
500.0000 mg | ORAL_TABLET | Freq: Once | ORAL | Status: AC
  Administered 2015-02-21: 500 mg via ORAL

## 2015-02-21 NOTE — Progress Notes (Signed)
10:11 AM  02/21/2015   Amy Hart Jun 14, 1960 [id number]  Referring provider: Jinny Sanders, MD [address] [place], [place] [address]  Chief Complaint  Patient presents with  . Cysto    gross hematuria    HPI: 54 year old female with episode of gross hematuria, associated menstrual-like lower abdominal cramping presents today for office cystoscopy to complete her hematuria workup. These episodes occurred approximately 4 weeks ago and her last episode of pain was last week.  In the interim, she underwent CT urogram Monday which showed a 3 mm right UVJ stone with proximal hydroureteronephrosis. She also has bilateral nonobstructing stones. 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 No other GU pathology identified other than the aforementioned.  Currently on methotrexate and Humira.  Questionable history of one episode of kidney stones.    Mother and sister both have history of renal stones.  PMH: Past Medical History  Diagnosis Date  . Allergic rhinitis due to pollen   . Unspecified hypothyroidism   . Atrial fibrillation ([place])   . Rheumatoid arthritis(714.0)   . Collagen vascular disease [facility])     Surgical History: Past Surgical History  Procedure Laterality Date  . Treatment fistula anal  1993    repair  . Fracture surgery  01/01    right elbow  . Endometrial ablation  2005  . Breast biopsy  2003    right, negative  . Rotator cuff repair  06/2008    R. Cuff tear  . Taylor bunionectomy      right    Home Medications:    Medication List       This list is accurate as of: 02/21/15 10:11 AM.  Always use your most recent med list.               cyclobenzaprine 10 MG tablet  Commonly known as:  FLEXERIL  Take 10 mg by mouth at bedtime.     EPINEPHrine 0.3 mg/0.3 mL Soaj injection  Commonly known as:  EPIPEN  Inject 0.3 mLs (0.3 mg total) into the muscle once.     fish oil-omega-3 fatty acids 1000 MG capsule  Take 2  g by mouth daily.     Glucosamine-Chondroitin 1500-1200 MG/30ML Liqd  Take as directed     HUMIRA PEN 40 MG/0.8ML injection  Generic drug:  adalimumab  Inject 40 mg into the skin every 30 (thirty) days.     levothyroxine 100 MCG tablet  Commonly known as:  SYNTHROID, LEVOTHROID  Take 1 tablet (100 mcg total) by mouth daily.     methotrexate 2.5 MG tablet  Commonly known as:  RHEUMATREX     multivitamin tablet  Take 1 tablet by mouth daily.     pantoprazole 20 MG tablet  Commonly known as:  PROTONIX  Take by mouth daily.     predniSONE 5 MG tablet  Commonly known as:  DELTASONE  Take 5 mg by mouth as needed. prior to traveling     traMADol 50 MG tablet  Commonly known as:  ULTRAM  Take 50 mg by mouth 2 (two) times daily as needed.     vitamin C 500 MG tablet  Commonly known as:  ASCORBIC ACID  Take 500 mg by mouth daily.        Allergies:  Allergies  Allergen Reactions  . Naproxen     REACTION: unspecified    Family History: Family History  Problem Relation Age of Onset  . Hypertension  Mother   . Thyroid disease Mother   . Nephrolithiasis Mother   . Diabetes Father     Social History:  reports that she has never smoked. She has never used smokeless tobacco. She reports that she drinks about 1.0 oz of alcohol per week. She reports that she does not use illicit drugs.   Physical Exam: BP 135/88 mmHg  Pulse 92  Ht 5\' 4"  (1.626 m)  Wt 215 lb 12.8 oz (97.886 kg)  BMI 37.02 kg/m2  Constitutional:  Alert and oriented, No acute distress. HEENT: [place] AT, moist mucus membranes.  Trachea midline, no masses. Cardiovascular: No clubbing, cyanosis, or edema. Respiratory: Normal respiratory effort, no increased work of breathing. GI: Abdomen is soft, nontender, nondistended, no abdominal masses GU: No CVA tenderness. Normal external genitalia. Normal urethral meatus. Skin: No rashes, bruises or suspicious lesions. Neurologic: Grossly intact, no focal deficits,  moving all 4 extremities. Psychiatric: Normal mood and affect.  Laboratory Data:   Urinalysis    Component Value Date/Time   GLUCOSEU Negative 02/06/2015 0902   BILIRUBINUR Negative 02/06/2015 0902   BILIRUBINUR neg 01/29/2015 1008   PROTEINUR neg 01/29/2015 1008   UROBILINOGEN negative 01/29/2015 1008   NITRITE Negative 02/06/2015 0902   NITRITE neg 01/29/2015 1008   LEUKOCYTESUR Trace* 02/06/2015 0902   LEUKOCYTESUR Negative 01/29/2015 1008    Pertinent Imaging: CLINICAL DATA: Gross hematuria for 1 month. Pelvic pain and cramping.  EXAM: CT ABDOMEN AND PELVIS WITHOUT AND WITH CONTRAST  TECHNIQUE: Multidetector CT imaging of the abdomen and pelvis was performed following the standard protocol before and following the bolus administration of intravenous contrast.  CONTRAST: 143mL OMNIPAQUE IOHEXOL 300 MG/ML SOLN  COMPARISON: None.  FINDINGS: Lower chest: No acute findings.  Hepatobiliary: No masses or other significant abnormality.  Pancreas: No mass, inflammatory changes, or other significant abnormality.  Spleen: Within normal limits in size and appearance.  Adrenals/Urinary Tract: No adrenal or renal masses are identified. No masses are seen involving the lower urinary tract or bladder.  Small less than 1 cm intrarenal calculi are seen bilaterally. A 3 mm calculus is seen in the distal right ureter near the ureterovesical junction causing mild right hydroureteronephrosis.  Stomach/Bowel: No evidence of obstruction, inflammatory process, or abnormal fluid collections.  Vascular/Lymphatic: No pathologically enlarged lymph nodes. No evidence of abdominal aortic aneurysm. A peripherally calcified proximal splenic artery aneurysm is seen adjacent to the pancreatic body which measures approximately 1.8 cm on image 19/series 5.  Reproductive: No mass or other significant abnormality.  Other: None.  Musculoskeletal: No suspicious bone  lesions identified.  IMPRESSION: No radiographic evidence urinary tract carcinoma.  3 mm distal right ureteral calculus causing mild right hydroureteronephrosis. Small nonobstructive bilateral intrarenal calculi also noted.  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 Followup by abdomen CTA recommended in 12 months. This recommendation follows ACR consensus guidelines: White Paper of the ACR Incidental Findings Committee II on Vascular Findings. J Am Coll Radiol [phone].   Electronically Signed  By: Earle Gell M.D.  On: 02/19/2015 16:38           Vitals     Height Weight BMI (Calculated)    5\' 4"  (1.626 m) 215 lb 12.8 oz (97.886 kg) 37.1      Interpretation Summary     CLINICAL DATA: Gross hematuria for 1 month. Pelvic pain and cramping.  EXAM: CT ABDOMEN AND PELVIS WITHOUT AND WITH CONTRAST  TECHNIQUE: Multidetector CT imaging of the abdomen and pelvis was performed following the standard  protocol before and following the bolus administration of intravenous contrast.  CONTRAST: 145mL OMNIPAQUE IOHEXOL 300 MG/ML SOLN  COMPARISON: None.  FINDINGS: Lower chest: No acute findings.  Hepatobiliary: No masses or other significant abnormality.  Pancreas: No mass, inflammatory changes, or other significant abnormality.  Spleen: Within normal limits in size and appearance.  Adrenals/Urinary Tract: No adrenal or renal masses are identified. No masses are seen involving the lower urinary tract or bladder.  Small less than 1 cm intrarenal calculi are seen bilaterally. A 3 mm calculus is seen in the distal right ureter near the ureterovesical junction causing mild right hydroureteronephrosis.  Stomach/Bowel: No evidence of obstruction, inflammatory process, or abnormal fluid collections.  Vascular/Lymphatic: No pathologically enlarged lymph nodes. No evidence of abdominal aortic aneurysm. A peripherally  calcified proximal splenic artery aneurysm is seen adjacent to the pancreatic body which measures approximately 1.8 cm on image 19/series 5.  Reproductive: No mass or other significant abnormality.  Other: None.  Musculoskeletal: No suspicious bone lesions identified.  IMPRESSION: No radiographic evidence urinary tract carcinoma.  3 mm distal right ureteral calculus causing mild right hydroureteronephrosis. Small nonobstructive bilateral intrarenal calculi also noted.  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 Followup by abdomen CTA recommended in 12 months. This recommendation follows ACR consensus guidelines: White Paper of the ACR Incidental Findings Committee II on Vascular Findings. J Am Coll Radiol [phone].   Electronically Signed  By: Earle Gell M.D.  On: 02/19/2015 16:38      Cystoscopy Procedure Note  Patient identification was confirmed, informed consent was obtained, and patient was prepped using Betadine solution.  Lidocaine jelly was administered per urethral meatus.    Preoperative abx where received prior to procedure.    Procedure: - Flexible cystoscope introduced, without any difficulty.   - Thorough search of the bladder revealed:    normal urethral meatus    normal urothelium    no stones    no ulcers     no tumors    no urethral polyps    no trabeculation  - Ureteral orifices were normal in position and appearance.  Post-Procedure: - Patient tolerated the procedure well    Assessment & Plan:    1. Gross hematuria Likely related to passage of kidney stones. No other pathology identified on CT urogram or cystoscopy today. - Urinalysis, Complete - ciprofloxacin (CIPRO) tablet 500 mg; Take 1 tablet (500 mg total) by mouth once. - lidocaine (XYLOCAINE) 2 % jelly 1 application; Place 1 application into the urethra once.  2. Right ureteral stone 3 mm right UVJ stone with proximal right hydroureteronephrosis.  She is minimally symptomatic from this. Today, we discussed options including surgical removal versus medical expulsive therapy. Given the size and location of the stone, she is in excellent chance of passing it on her own without any intervention. I have recommended initiation of Flomax and encouraged hydration. She will strain her urine. Plan to arrange follow-up ultrasound in 2 weeks to assess for resolution of hydronephrosis. We will address her nonobstructing stones at next visit.  Warning signs discussed in detail today including fever, uncontrolled pain, or inability to tolerate food or drink. In this situation, she was advised to seek immediate medical attention.  -Flomax -Strain urine -Return to care in 2 weeks with ultrasound prior  3. Hydronephrosis, right As above  4. Splenic artery aneurysm [facility]) Per guidelines, recommend CT scan in 1 year   Return in about 2 weeks (around 03/07/2015) for f/u RUS (prior to visit).  Hollice Espy, MD  [facility], [facility] [address] [place], [place] [address] [phone]

## 2015-02-21 NOTE — Patient Instructions (Signed)
Dietary Guidelines to Help Prevent Kidney Stones Your risk of kidney stones can be decreased by adjusting the foods you eat. The most important thing you can do is drink enough fluid. You should drink enough fluid to keep your urine clear or pale yellow. The following guidelines provide specific information for the type of kidney stone you have had. GUIDELINES ACCORDING TO TYPE OF KIDNEY STONE Calcium Oxalate Kidney Stones  Reduce the amount of salt you eat. Foods that have a lot of salt cause your body to release excess calcium into your urine. The excess calcium can combine with a substance called oxalate to form kidney stones.  Reduce the amount of animal protein you eat if the amount you eat is excessive. Animal protein causes your body to release excess calcium into your urine. Ask your dietitian how much protein from animal sources you should be eating.  Avoid foods that are high in oxalates. If you take vitamins, they should have less than 500 mg of vitamin C. Your body turns vitamin C into oxalates. You do not need to avoid fruits and vegetables high in vitamin C. Calcium Phosphate Kidney Stones  Reduce the amount of salt you eat to help prevent the release of excess calcium into your urine.  Reduce the amount of animal protein you eat if the amount you eat is excessive. Animal protein causes your body to release excess calcium into your urine. Ask your dietitian how much protein from animal sources you should be eating.  Get enough calcium from food or take a calcium supplement (ask your dietitian for recommendations). Food sources of calcium that do not increase your risk of kidney stones include:  Broccoli.  Dairy products, such as cheese and yogurt.  Pudding. Uric Acid Kidney Stones  Do not have more than 6 oz of animal protein per day. FOOD SOURCES Animal Protein Sources  Meat (all types).  Poultry.  Eggs.  Fish, seafood. Foods High in Salt  Salt seasonings.  Soy  sauce.  Teriyaki sauce.  Cured and processed meats.  Salted crackers and snack foods.  Fast food.  Canned soups and most canned foods. Foods High in Oxalates  Grains:  Amaranth.  Barley.  Grits.  Wheat germ.  Bran.  Buckwheat flour.  All bran cereals.  Pretzels.  Whole wheat bread.  Vegetables:  Beans (wax).  Beets and beet greens.  Collard greens.  Eggplant.  Escarole.  Leeks.  Okra.  Parsley.  Rutabagas.  Spinach.  Swiss chard.  Tomato paste.  Fried potatoes.  Sweet potatoes.  Fruits:  Red currants.  Figs.  Kiwi.  Rhubarb.  Meat and Other Protein Sources:  Beans (dried).  Soy burgers and other soybean products.  Miso.  Nuts (peanuts, almonds, pecans, cashews, hazelnuts).  Nut butters.  Sesame seeds and tahini (paste made of sesame seeds).  Poppy seeds.  Beverages:  Chocolate drink mixes.  Soy milk.  Instant iced tea.  Juices made from high-oxalate fruits or vegetables.  Other:  Carob.  Chocolate.  Fruitcake.  Marmalades.   This information is not intended to replace advice given to you by your health care provider. Make sure you discuss any questions you have with your health care provider.   Document Released: 07/19/2010 Document Revised: 03/29/2013 Document Reviewed: 02/18/2013 Elsevier Interactive Patient Education 2016 Elsevier Inc.  

## 2015-03-07 ENCOUNTER — Ambulatory Visit
Admission: RE | Admit: 2015-03-07 | Discharge: 2015-03-07 | Disposition: A | Discharge status: Home / Self Care | Payer: BLUE CROSS/BLUE SHIELD | Source: Ambulatory Visit | Attending: Urology | Admitting: Urology

## 2015-03-07 DIAGNOSIS — N1330 Unspecified hydronephrosis: Secondary | ICD-10-CM | POA: Diagnosis present

## 2015-03-07 DIAGNOSIS — N200 Calculus of kidney: Secondary | ICD-10-CM | POA: Insufficient documentation

## 2015-03-08 ENCOUNTER — Encounter: Payer: Self-pay | Admitting: Urology

## 2015-03-08 ENCOUNTER — Ambulatory Visit (INDEPENDENT_AMBULATORY_CARE_PROVIDER_SITE_OTHER): Payer: BLUE CROSS/BLUE SHIELD | Admitting: Urology

## 2015-03-08 VITALS — BP 125/85 | HR 101 | Ht 64.0 in | Wt 216.6 lb

## 2015-03-08 DIAGNOSIS — N201 Calculus of ureter: Secondary | ICD-10-CM

## 2015-03-08 DIAGNOSIS — I728 Aneurysm of other specified arteries: Secondary | ICD-10-CM

## 2015-03-08 DIAGNOSIS — N200 Calculus of kidney: Secondary | ICD-10-CM

## 2015-03-08 LAB — URINALYSIS, COMPLETE
Bilirubin, UA: NEGATIVE
GLUCOSE, UA: NEGATIVE
KETONES UA: NEGATIVE
LEUKOCYTES UA: NEGATIVE
Nitrite, UA: NEGATIVE
PROTEIN UA: NEGATIVE
RBC, UA: NEGATIVE
SPEC GRAV UA: 1.02 (ref 1.005–1.030)
Urobilinogen, Ur: 0.2 mg/dL (ref 0.2–1.0)
pH, UA: 7 (ref 5.0–7.5)

## 2015-03-08 LAB — MICROSCOPIC EXAMINATION: Epithelial Cells (non renal): NONE SEEN /hpf (ref 0–10)

## 2015-03-08 NOTE — Progress Notes (Signed)
10:32 AM  03/08/2015   Amy Hart [birth year] [id number]  Referring provider: Jinny Sanders, MD [address] [place], [place] [address]  Chief Complaint  Patient presents with  . Results    HPI: 54 year old female with episode of gross hematuria at which time CT urogram Monday which showed a 3 mm right UVJ stone with proximal hydroureteronephrosis. She also has small bilateral nonobstructing stones. 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 No other GU pathology identified other than the aforementioned.  She underwent a cystoscopy which was negative for any bladder malignancy.  She returns today after follow-up renal ultrasound shows markedly decrease of right hydronephrosis. She also brings with her a small stone which she passed and collected. She's not had any further flank pain or lower abdominal discomfort.  Questionable history of one episode of kidney stones in the remote past (90s) but no surgical intervention needed.    Mother and sister both have history of renal stones.  She does report that she drinks plenty of water throughout the day and has done so for at least 20 years. She and her wife eat a very healthy diet as her wife is a diabetic and voids salt and excessive fat intake.  PMH: Past Medical History  Diagnosis Date  . Allergic rhinitis due to pollen   . Unspecified hypothyroidism   . Atrial fibrillation ([place])   . Rheumatoid arthritis(714.0)   . Collagen vascular disease [facility])     Surgical History: Past Surgical History  Procedure Laterality Date  . Treatment fistula anal  1993    repair  . Fracture surgery  01/01    right elbow  . Endometrial ablation  2005  . Breast biopsy  2003    right, negative  . Rotator cuff repair  06/2008    R. Cuff tear  . Taylor bunionectomy      right    Home Medications:    Medication List       This list is accurate as of: 03/08/15 10:32 AM.  Always use your most recent med list.                cyclobenzaprine 10 MG tablet  Commonly known as:  FLEXERIL  Take 10 mg by mouth at bedtime.     EPINEPHrine 0.3 mg/0.3 mL Soaj injection  Commonly known as:  EPIPEN  Inject 0.3 mLs (0.3 mg total) into the muscle once.     fish oil-omega-3 fatty acids 1000 MG capsule  Take 2 g by mouth daily.     Glucosamine-Chondroitin 1500-1200 MG/30ML Liqd  Take as directed     HUMIRA PEN 40 MG/0.8ML injection  Generic drug:  adalimumab  Inject 40 mg into the skin every 30 (thirty) days.     levothyroxine 100 MCG tablet  Commonly known as:  SYNTHROID, LEVOTHROID  Take 1 tablet (100 mcg total) by mouth daily.     methotrexate 2.5 MG tablet  Commonly known as:  RHEUMATREX     multivitamin tablet  Take 1 tablet by mouth daily.     pantoprazole 20 MG tablet  Commonly known as:  PROTONIX  Take by mouth daily.     predniSONE 5 MG tablet  Commonly known as:  DELTASONE  Take 5 mg by mouth as needed. prior to traveling     tamsulosin 0.4 MG Caps capsule  Commonly known as:  FLOMAX  Take 1 capsule (0.4 mg total) by mouth daily.  traMADol 50 MG tablet  Commonly known as:  ULTRAM  Take 50 mg by mouth 2 (two) times daily as needed.     vitamin C 500 MG tablet  Commonly known as:  ASCORBIC ACID  Take 500 mg by mouth daily.        Allergies:  Allergies  Allergen Reactions  . Naproxen     REACTION: unspecified    Family History: Family History  Problem Relation Age of Onset  . Hypertension Mother   . Thyroid disease Mother   . Nephrolithiasis Mother   . Diabetes Father     Social History:  reports that she has never smoked. She has never used smokeless tobacco. She reports that she drinks about 1.0 oz of alcohol per week. She reports that she does not use illicit drugs.   Physical Exam: BP 125/85 mmHg  Pulse 101  Ht 5\' 4"  (1.626 m)  Wt 216 lb 9.6 oz (98.249 kg)  BMI 37.16 kg/m2  Constitutional:  Alert and oriented, No acute distress. HEENT: [place] AT,  moist mucus membranes.  Trachea midline, no masses. Cardiovascular: No clubbing, cyanosis, or edema. Respiratory: Normal respiratory effort, no increased work of breathing. GI: Abdomen is soft, nontender, nondistended, no abdominal masses. Obese. Neurologic: Grossly intact, no focal deficits, moving all 4 extremities. Psychiatric: Normal mood and affect.  Laboratory Data: CBC    Component Value Date/Time   WBC 5.4 01/29/2015 1039   RBC 4.49 01/29/2015 1039   HGB 14.8 01/29/2015 1039   HCT 43.4 01/29/2015 1039   PLT 191.0 01/29/2015 1039   MCV 96.8 01/29/2015 1039   MCHC 34.0 01/29/2015 1039   RDW 14.3 01/29/2015 1039   LYMPHSABS 1.9 01/29/2015 1039   MONOABS 0.5 01/29/2015 1039   EOSABS 0.1 01/29/2015 1039   BASOSABS 0.0 01/29/2015 1039    BMP Latest Ref Rng 01/29/2015 11/29/2014 06/10/2013  Glucose 70 - 99 mg/dL 114(H) 101(H) 100(H)  BUN 6 - 23 mg/dL 17 11 12   Creatinine 0.40 - 1.20 mg/dL 0.84 0.85 0.66  Sodium 135 - 145 mEq/L 139 141 142  Potassium 3.5 - 5.1 mEq/L 4.8 4.7 4.5  Chloride 96 - 112 mEq/L 103 105 105  CO2 19 - 32 mEq/L 29 29 26   Calcium 8.4 - 10.5 mg/dL 9.9 9.7 9.3     Pertinent Imaging: CLINICAL DATA: Gross hematuria for 1 month. Pelvic pain and cramping.  EXAM: CT ABDOMEN AND PELVIS WITHOUT AND WITH CONTRAST  TECHNIQUE: Multidetector CT imaging of the abdomen and pelvis was performed following the standard protocol before and following the bolus administration of intravenous contrast.  CONTRAST: 167mL OMNIPAQUE IOHEXOL 300 MG/ML SOLN  COMPARISON: None.  FINDINGS: Lower chest: No acute findings.  Hepatobiliary: No masses or other significant abnormality.  Pancreas: No mass, inflammatory changes, or other significant abnormality.  Spleen: Within normal limits in size and appearance.  Adrenals/Urinary Tract: No adrenal or renal masses are identified. No masses are seen involving the lower urinary tract or bladder.  Small less  than 1 cm intrarenal calculi are seen bilaterally. A 3 mm calculus is seen in the distal right ureter near the ureterovesical junction causing mild right hydroureteronephrosis.  Stomach/Bowel: No evidence of obstruction, inflammatory process, or abnormal fluid collections.  Vascular/Lymphatic: No pathologically enlarged lymph nodes. No evidence of abdominal aortic aneurysm. A peripherally calcified proximal splenic artery aneurysm is seen adjacent to the pancreatic body which measures approximately 1.8 cm on image 19/series 5.  Reproductive: No mass or other significant abnormality.  Other:  None.  Musculoskeletal: No suspicious bone lesions identified.  IMPRESSION: No radiographic evidence urinary tract carcinoma.  3 mm distal right ureteral calculus causing mild right hydroureteronephrosis. Small nonobstructive bilateral intrarenal calculi also noted.  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 Followup by abdomen CTA recommended in 12 months. This recommendation follows ACR consensus guidelines: White Paper of the ACR Incidental Findings Committee II on Vascular Findings. J Am Coll Radiol [phone].   Electronically Signed  By: Earle Gell M.D.  On: 02/19/2015 16:38           Vitals     Height Weight BMI (Calculated)    5\' 4"  (1.626 m) 215 lb 12.8 oz (97.886 kg) 37.1      Interpretation Summary      CLINICAL DATA: Recent right UVJ stone and right hydronephrosis. Patient reportedly passed 1 stone 8 days prior.  EXAM: RENAL / URINARY TRACT ULTRASOUND COMPLETE  COMPARISON: 02/19/2015 CT abdomen/pelvis.  FINDINGS: Right Kidney:  Length: 10.8 cm. Echogenicity within normal limits. Mild residual fullness of the central right renal collecting system without overt right hydronephrosis. Nonobstructing 4 mm stone in the mid to upper right kidney. Nonobstructing 5 mm stone in the lower right kidney. No right renal  mass detected.  Left Kidney:  Length: 12.0 cm . Echogenicity within normal limits. No hydronephrosis. Nonobstructing 4 mm upper left renal stone. Nonobstructing 6 mm lower left renal stone.  Bladder:  Appears normal for degree of bladder distention. Both ureteral jets are demonstrated in the bladder lumen. Pre-void bladder volume of 653 cc. Postvoid bladder volume of 83 cc.  IMPRESSION: 1. Mild residual fullness of the central right renal collecting system, without overt right hydronephrosis, and significantly decreased since 02/19/2015. Normal ureteral jets are demonstrated bilaterally in the bladder lumen. 2. Nonobstructing stones in both kidneys as described. 3. Mild bladder voiding dysfunction with 83 cc postvoid bladder residual.   Electronically Signed  By: Ilona Sorrel M.D.  On: 03/07/2015 15:35   Assessment & Plan:    1. Nonobstructing kidney stones We discussed general stone prevention techniques including drinking plenty water with goal of producing 2.5 L urine daily, increased citric acid intake, avoidance of high oxalate containing foods, and decreased salt intake.  Information about dietary recommendations given today.   Given the relatively small size of her stones and lack of symptoms, I would not recommend any intervention. She was advised to return sooner should develops any symptoms or flank pain.  Recommend follow up imaging in 1 year to ensure stable.  2. Right ureteral stone 3 mm right UVJ stone passed.  Follow up RUS improved with hydronephrosis.  Stone sent for stone analysis, will call patient if stone not CaOx as suspected.  3. Splenic artery aneurysm ([place]) Per guidelines, recommend CT scan in 1 year   Return in about 1 year (around 03/07/2016) for CT scan prior.    Hollice Espy, MD  [facility] [address] [place], [place] [address] [phone]

## 2015-03-20 ENCOUNTER — Encounter: Payer: Self-pay | Admitting: Urology

## 2015-07-12 DIAGNOSIS — H524 Presbyopia: Secondary | ICD-10-CM | POA: Diagnosis not present

## 2015-09-21 DIAGNOSIS — M0570 Rheumatoid arthritis with rheumatoid factor of unspecified site without organ or systems involvement: Secondary | ICD-10-CM | POA: Diagnosis not present

## 2015-09-21 DIAGNOSIS — Z79899 Other long term (current) drug therapy: Secondary | ICD-10-CM | POA: Diagnosis not present

## 2015-11-08 DIAGNOSIS — M25441 Effusion, right hand: Secondary | ICD-10-CM | POA: Diagnosis not present

## 2015-11-08 DIAGNOSIS — M0570 Rheumatoid arthritis with rheumatoid factor of unspecified site without organ or systems involvement: Secondary | ICD-10-CM | POA: Diagnosis not present

## 2015-11-08 DIAGNOSIS — M25442 Effusion, left hand: Secondary | ICD-10-CM | POA: Diagnosis not present

## 2015-11-14 ENCOUNTER — Telehealth: Payer: Self-pay

## 2015-11-14 NOTE — Telephone Encounter (Signed)
I called pt to find out if Dr Charlestine Night had addressed her elevated potassium levels on the faxed labs we received. She said Dr Charlestine Night advised her to have Dr Diona Browner take care of it. The pt has an appointment coming up later this month.

## 2015-11-15 NOTE — Telephone Encounter (Signed)
Spoke to pt and advised per Dr Diona Browner. Pt verbally expressed understanding

## 2015-11-15 NOTE — Telephone Encounter (Signed)
Okay I will take care of it. Should not wait until 8/25 OV. Is she taking any potassium supplement, multivitamin.. If so stop.  Decrease high potassium containing foods.  Does not look like she is on a med that increases potassium.  We can recheck at upcoming [place].

## 2015-11-29 ENCOUNTER — Telehealth: Payer: Self-pay | Admitting: Family Medicine

## 2015-11-29 DIAGNOSIS — E7800 Pure hypercholesterolemia, unspecified: Secondary | ICD-10-CM

## 2015-11-29 DIAGNOSIS — Z1159 Encounter for screening for other viral diseases: Secondary | ICD-10-CM

## 2015-11-29 DIAGNOSIS — E039 Hypothyroidism, unspecified: Secondary | ICD-10-CM

## 2015-11-29 NOTE — Telephone Encounter (Signed)
-----   Message from [name] sent at 11/23/2015  4:16 PM EDT ----- Regarding: Lab orders for 8.25.17 Patient is scheduled for CPX labs, please order future labs, Thanks , Karna Christmas

## 2015-11-29 NOTE — Telephone Encounter (Signed)
Labs ordered.

## 2015-11-30 ENCOUNTER — Other Ambulatory Visit (INDEPENDENT_AMBULATORY_CARE_PROVIDER_SITE_OTHER): Payer: BLUE CROSS/BLUE SHIELD

## 2015-11-30 DIAGNOSIS — E7800 Pure hypercholesterolemia, unspecified: Secondary | ICD-10-CM

## 2015-11-30 DIAGNOSIS — E039 Hypothyroidism, unspecified: Secondary | ICD-10-CM | POA: Diagnosis not present

## 2015-11-30 DIAGNOSIS — Z1159 Encounter for screening for other viral diseases: Secondary | ICD-10-CM | POA: Diagnosis not present

## 2015-11-30 LAB — LIPID PANEL
CHOLESTEROL: 215 mg/dL — AB (ref 0–200)
HDL: 84.6 mg/dL (ref >39.00)
LDL CALC: 105 mg/dL — AB (ref 0–99)
NonHDL: 130.16
TRIGLYCERIDES: 127 mg/dL (ref 0.0–149.0)
Total CHOL/HDL Ratio: 3
VLDL: 25.4 mg/dL (ref 0.0–40.0)

## 2015-11-30 LAB — T3, FREE: T3 FREE: 2.5 pg/mL (ref 2.3–4.2)

## 2015-11-30 LAB — TSH: TSH: 0.78 u[IU]/mL (ref 0.35–4.50)

## 2015-11-30 LAB — T4, FREE: Free T4: 1.15 ng/dL (ref 0.60–1.60)

## 2015-12-01 LAB — HEPATITIS C ANTIBODY: HCV AB: NEGATIVE

## 2015-12-07 ENCOUNTER — Encounter: Payer: Self-pay | Admitting: Family Medicine

## 2015-12-07 ENCOUNTER — Ambulatory Visit (INDEPENDENT_AMBULATORY_CARE_PROVIDER_SITE_OTHER): Payer: BLUE CROSS/BLUE SHIELD | Admitting: Family Medicine

## 2015-12-07 VITALS — BP 110/70 | HR 82 | Temp 98.5°F | Ht 64.0 in | Wt 210.5 lb

## 2015-12-07 DIAGNOSIS — E7800 Pure hypercholesterolemia, unspecified: Secondary | ICD-10-CM | POA: Diagnosis not present

## 2015-12-07 DIAGNOSIS — E875 Hyperkalemia: Secondary | ICD-10-CM | POA: Diagnosis not present

## 2015-12-07 DIAGNOSIS — E039 Hypothyroidism, unspecified: Secondary | ICD-10-CM

## 2015-12-07 DIAGNOSIS — Z23 Encounter for immunization: Secondary | ICD-10-CM

## 2015-12-07 DIAGNOSIS — M069 Rheumatoid arthritis, unspecified: Secondary | ICD-10-CM

## 2015-12-07 DIAGNOSIS — E669 Obesity, unspecified: Secondary | ICD-10-CM

## 2015-12-07 DIAGNOSIS — Z0000 Encounter for general adult medical examination without abnormal findings: Secondary | ICD-10-CM

## 2015-12-07 LAB — BASIC METABOLIC PANEL
BUN: 17 mg/dL (ref 6–23)
CALCIUM: 9.3 mg/dL (ref 8.4–10.5)
CHLORIDE: 105 meq/L (ref 96–112)
CO2: 30 meq/L (ref 19–32)
CREATININE: 0.9 mg/dL (ref 0.40–1.20)
GFR: 69.18 mL/min (ref >60.00)
GLUCOSE: 104 mg/dL — AB (ref 70–99)
Potassium: 4.5 mEq/L (ref 3.5–5.1)
Sodium: 140 mEq/L (ref 135–145)

## 2015-12-07 NOTE — Assessment & Plan Note (Signed)
Referred to nutrition counseling. Increase exercise, recs reviewed.

## 2015-12-07 NOTE — Patient Instructions (Addendum)
Stop at lab on way out to check potassium off multivitamin. Call to set up mammogram on your.

## 2015-12-07 NOTE — Assessment & Plan Note (Signed)
Diet controlled. Encouraged exercise, weight loss, healthy eating habits.  

## 2015-12-07 NOTE — Addendum Note (Signed)
Addended by: Carter Kitten on: 12/07/2015 09:31 AM   Modules accepted: Orders

## 2015-12-07 NOTE — Assessment & Plan Note (Signed)
Immunocompromised on Humira and methotrexate. Needs pneumonia vaccines. Will receive PCV 13 today, PCV23 next year and PCV 23 in 5 years.

## 2015-12-07 NOTE — Progress Notes (Signed)
The patient is here for annual wellness exam and preventative care.   Elevated Cholesterol:LDL at goal < 130 on no med. Lab Results  Component Value Date   CHOL 215 (H) 11/30/2015   HDL 84.60 11/30/2015   LDLCALC 105 (H) 11/30/2015   LDLDIRECT 131.3 11/25/2011   TRIG 127.0 11/30/2015   CHOLHDL 3 11/30/2015  Using medications without problems:on fish oil  Muscle aches: None  Diet compliance: improved. Exercise: moderate Other complaints:  Wt Readings from Last 3 Encounters:  12/07/15 210 lb 8 oz (95.5 kg)  03/08/15 216 lb 9.6 oz (98.2 kg)  02/21/15 215 lb 12.8 oz (97.9 kg)   Hypothyroid: Lab Results  Component Value Date   TSH 0.78 11/30/2015   Rheumatoid Arthritis: on methotrexate and humira (increase back to q3 weeks) with moderate control.  She has had improvement with turmeric.  Followed by Dr. Charlestine Night.  She has been having hot flashes. Hoy Register helps some.  Potassium high at 09/2015 labs with Dr. Charlestine Night.Blanchie Serve for re-eval off multivitamin and bananas.  Social History /Family History/Past Medical History reviewed and updated if needed.  Review of Systems  Constitutional: Negative for fever, fatigue and unexpected weight change.  HENT: Negative for ear pain, congestion, sore throat, sneezing, trouble swallowing and sinus pressure.  Eyes: Negative for pain and itching.  Respiratory: Negative for cough, shortness of breath and wheezing.  Cardiovascular: Negative for chest pain, palpitations and leg swelling.  Gastrointestinal: Negative for nausea, abdominal pain, diarrhea, constipation and blood in stool.  Genitourinary: Negative for dysuria, hematuria, vaginal discharge, difficulty urinating and menstrual problem.  Skin: Negative for rash.  Neurological: Negative for syncope, weakness, light-headedness, numbness and headaches.  Psychiatric/Behavioral: Negative for confusion and dysphoric mood. The patient is not nervous/anxious.  Objective:    Physical Exam  Constitutional: Vital signs are normal. She appears well-developed and well-nourished. She is cooperative. Non-toxic appearance. She does not appear ill. No distress.  HENT:  Head: Normocephalic.  Right Ear: Hearing, tympanic membrane, external ear and ear canal normal.  Left Ear: Hearing, tympanic membrane, external ear and ear canal normal.  Nose: clear Eyes: Conjunctivae, EOM and lids are normal. Pupils are equal, round, and reactive to light. No foreign bodies found.  Neck: Trachea normal and normal range of motion. Neck supple. Carotid bruit is not present. No mass and no thyromegaly present.  Cardiovascular: Normal rate, regular rhythm, S1 normal, S2 normal, normal heart sounds and intact distal pulses. Exam reveals no gallop.  No murmur heard.  Pulmonary/Chest: Effort normal and breath sounds normal. No respiratory distress. She has no wheezes. She has no rhonchi. She has no rales.  Abdominal: Soft. Normal appearance and bowel sounds are normal. She exhibits no distension, no fluid wave, no abdominal bruit and no mass. There is no hepatosplenomegaly. There is no tenderness. There is no rebound, no guarding and no CVA tenderness. No hernia.  Genitourinary: Vagina normal and uterus normal. No breast swelling, tenderness, discharge or bleeding. Pelvic exam was performed with patient prone. There is no rash, tenderness, lesion or injury on the right labia. There is no rash, tenderness, lesion or injury on the left labia. Uterus is not deviated, not enlarged, not fixed and not tender.  Right adnexum displays no mass, no tenderness and no fullness. Left adnexum displays no mass, no tenderness and no fullness. No erythema, tenderness or bleeding around the vagina. No foreign body around the vagina. No signs of injury around the vagina. No vaginal discharge found.  PAP  NOT  performed Lymphadenopathy:  She has no cervical adenopathy.  She has no axillary adenopathy.   Neurological: She is alert. She has normal strength. No cranial nerve deficit or sensory deficit.  Skin: Skin is warm, dry and intact. No rash noted.  Psychiatric: Her speech is normal and behavior is normal. Judgment normal. Her mood appears not anxious. Cognition and memory are normal. She does not exhibit a depressed mood.  Assessment & Plan:   The patient's preventative maintenance and recommended screening tests for an annual wellness exam were reviewed in full today.  Brought up to date unless services declined.  Counselled on the importance of diet, exercise, and its role in overall health and mortality.  The patient's FH and SH was reviewed, including their home life, tobacco status, and drug and alcohol status.  Body mass index is 36.13 kg/m.   Vaccines: Given flu today, given immunocompromised she will receive PCV 13 today, PCV23 next year and PCV 23 in 5 years.  Nonsmoker.  PAP/DVE: Q 5year, last done 06/2013 nml , no HPV  Mammo: nml 06/2013, no family history for breast cancer,  Due q 2 years Colon: 01/2012 Dr. Carlean Purl, nml repeat in 10 years  STD screening; refused  Hep C : done

## 2015-12-07 NOTE — Progress Notes (Signed)
Pre visit review using our clinic review tool, if applicable. No additional management support is needed unless otherwise documented below in the visit note. 

## 2015-12-07 NOTE — Assessment & Plan Note (Signed)
Stable control. 

## 2015-12-07 NOTE — Assessment & Plan Note (Signed)
Due for re-eval off multivitamin.

## 2015-12-13 ENCOUNTER — Encounter: Payer: Self-pay | Admitting: Family Medicine

## 2015-12-13 DIAGNOSIS — Z1231 Encounter for screening mammogram for malignant neoplasm of breast: Secondary | ICD-10-CM | POA: Diagnosis not present

## 2015-12-14 ENCOUNTER — Other Ambulatory Visit: Payer: Self-pay | Admitting: Family Medicine

## 2016-02-06 ENCOUNTER — Telehealth: Payer: Self-pay | Admitting: Urology

## 2016-02-06 NOTE — Telephone Encounter (Signed)
Patient called and cx her follow up appt. Per the cx message she did not give a reason or want to reschd the appt.  [name]

## 2016-03-04 DIAGNOSIS — Z79899 Other long term (current) drug therapy: Secondary | ICD-10-CM | POA: Diagnosis not present

## 2016-03-04 DIAGNOSIS — R7989 Other specified abnormal findings of blood chemistry: Secondary | ICD-10-CM | POA: Diagnosis not present

## 2016-03-04 DIAGNOSIS — M0570 Rheumatoid arthritis with rheumatoid factor of unspecified site without organ or systems involvement: Secondary | ICD-10-CM | POA: Diagnosis not present

## 2016-03-14 ENCOUNTER — Ambulatory Visit: Payer: BLUE CROSS/BLUE SHIELD | Admitting: Urology

## 2016-04-10 DIAGNOSIS — M65312 Trigger thumb, left thumb: Secondary | ICD-10-CM | POA: Diagnosis not present

## 2016-04-10 DIAGNOSIS — M0570 Rheumatoid arthritis with rheumatoid factor of unspecified site without organ or systems involvement: Secondary | ICD-10-CM | POA: Diagnosis not present

## 2016-04-10 DIAGNOSIS — M79641 Pain in right hand: Secondary | ICD-10-CM | POA: Diagnosis not present

## 2016-04-10 DIAGNOSIS — M79642 Pain in left hand: Secondary | ICD-10-CM | POA: Diagnosis not present

## 2016-06-06 DIAGNOSIS — M0570 Rheumatoid arthritis with rheumatoid factor of unspecified site without organ or systems involvement: Secondary | ICD-10-CM | POA: Diagnosis not present

## 2016-07-14 DIAGNOSIS — H524 Presbyopia: Secondary | ICD-10-CM | POA: Diagnosis not present

## 2016-07-28 DIAGNOSIS — M79642 Pain in left hand: Secondary | ICD-10-CM | POA: Diagnosis not present

## 2016-07-28 DIAGNOSIS — M0570 Rheumatoid arthritis with rheumatoid factor of unspecified site without organ or systems involvement: Secondary | ICD-10-CM | POA: Diagnosis not present

## 2016-07-28 DIAGNOSIS — Z79899 Other long term (current) drug therapy: Secondary | ICD-10-CM | POA: Diagnosis not present

## 2016-07-28 DIAGNOSIS — M79641 Pain in right hand: Secondary | ICD-10-CM | POA: Diagnosis not present

## 2016-08-17 IMAGING — CT CT ABD-PEL WO/W CM
2 of 10 series · 10 of 46 positions shown, 16 images · IV contrast (omnipaque)
Comparison: None.

CLINICAL DATA: Gross hematuria for 1 month. Pelvic pain and
cramping.

EXAM:
CT ABDOMEN AND PELVIS WITHOUT AND WITH CONTRAST
TECHNIQUE: Multidetector CT imaging of the abdomen and pelvis was performed
following the standard protocol before and following the bolus
administration of intravenous contrast.
CONTRAST:  125mL OMNIPAQUE IOHEXOL 300 MG/ML  SOLN

[Series 7: cor hematuria > 45 wo · coronal · 0.78mm/px · 2 of 161 slices shown, 3 images]
[im 54/161  soft-tissue]
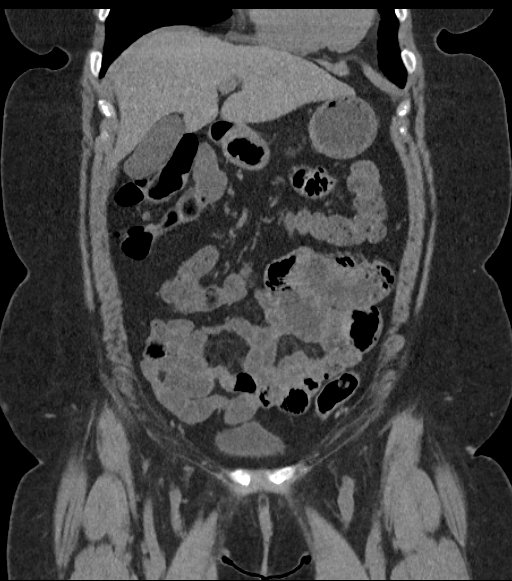
[im 54/161  bone]
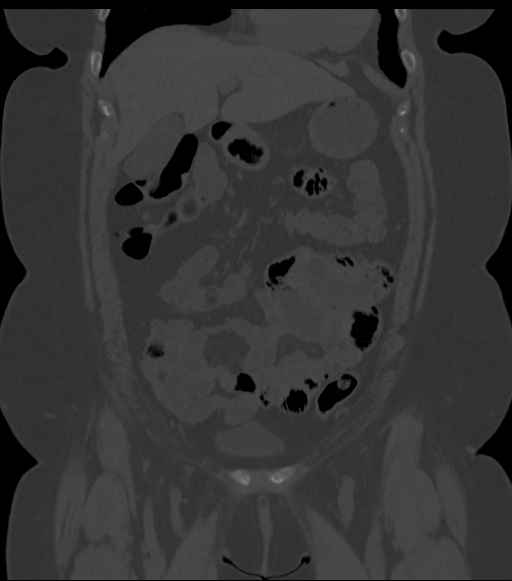
[im 107/161  soft-tissue]
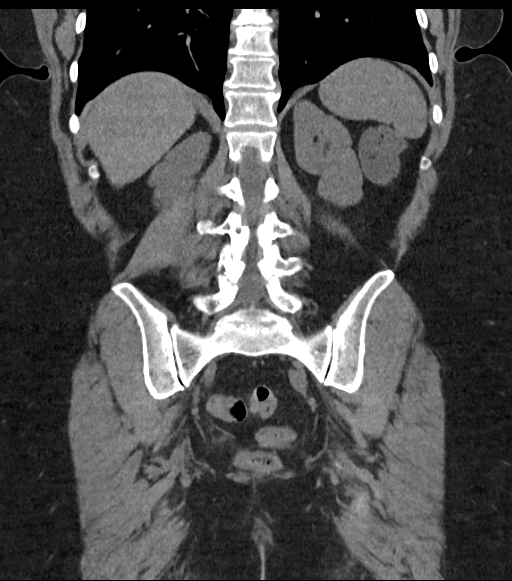

[Series 12: delay · axial · delayed · 0.80mm/px · z∈[-1092,-712]mm · 8 of 98 slices shown, 13 images]
[im 11/98  soft-tissue]
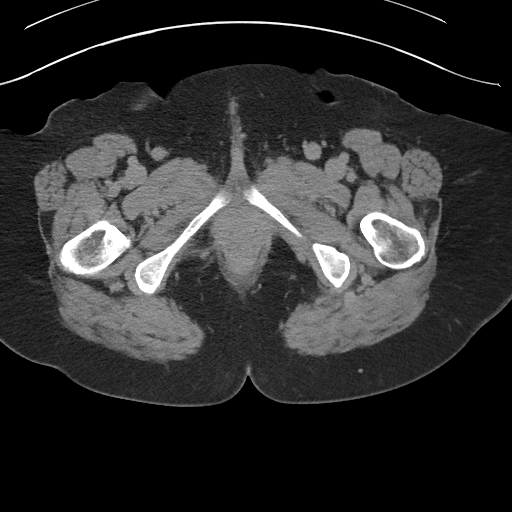
[im 11/98  bone]
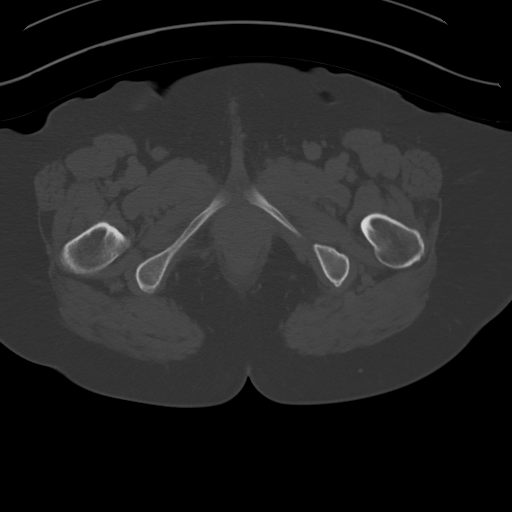
[im 22/98  soft-tissue]
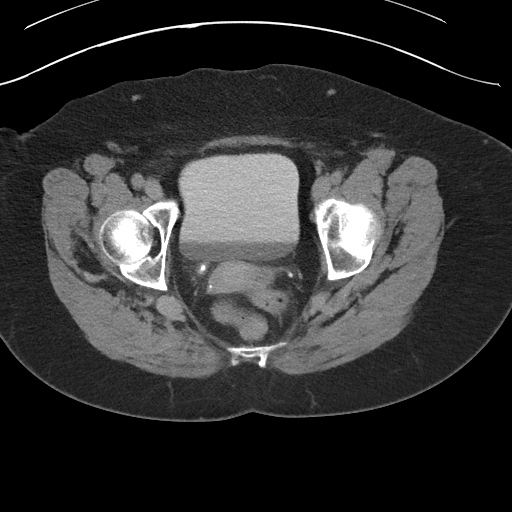
[im 33/98  soft-tissue]
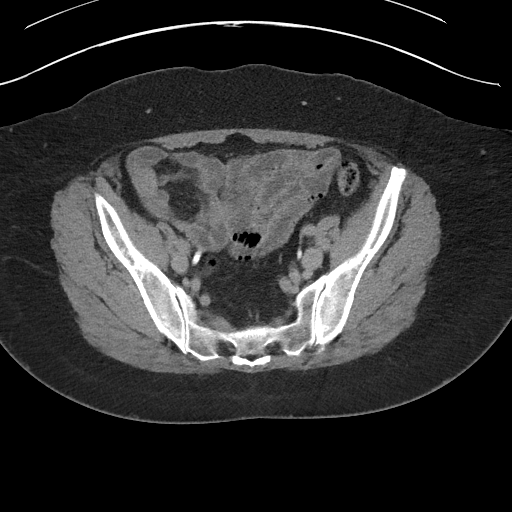
[im 44/98  soft-tissue]
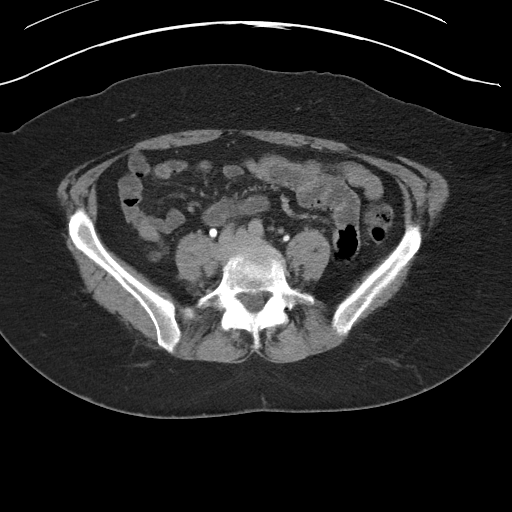
[im 54/98  soft-tissue]
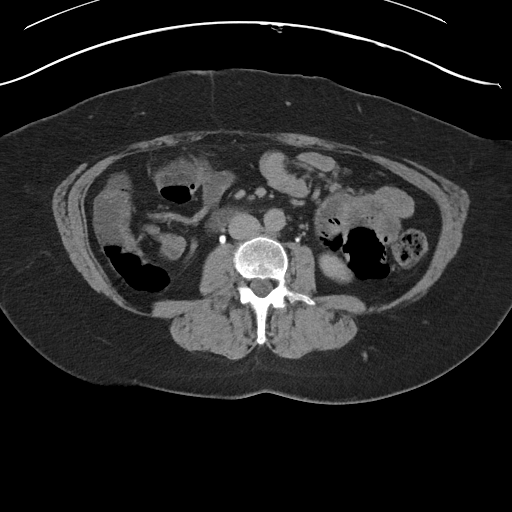
[im 54/98  lung]
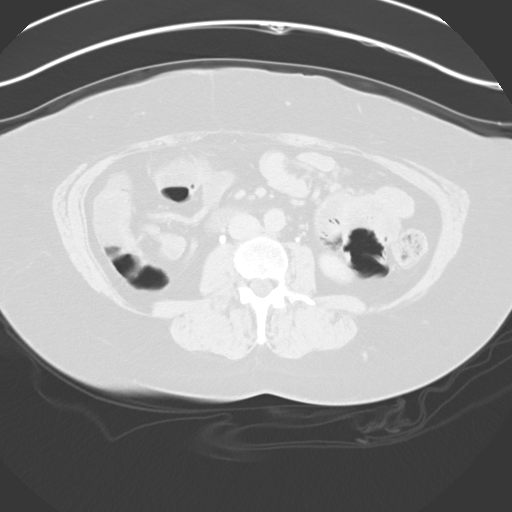
[im 65/98  soft-tissue]
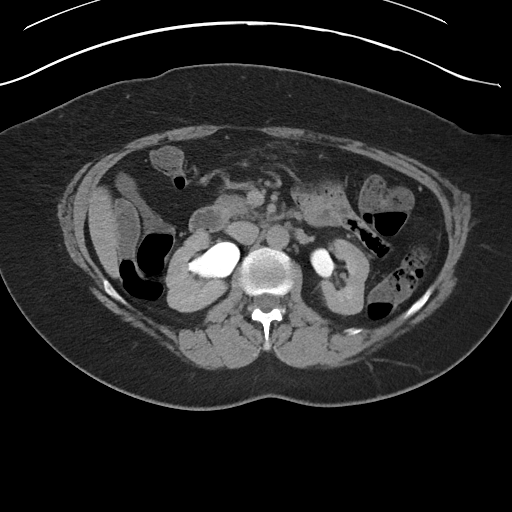
[im 65/98  lung]
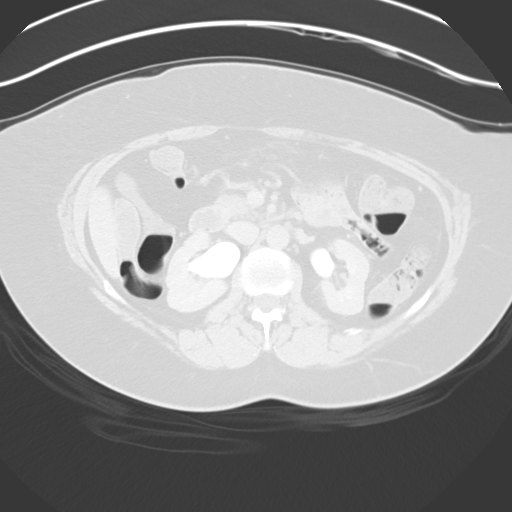
[im 76/98  soft-tissue]
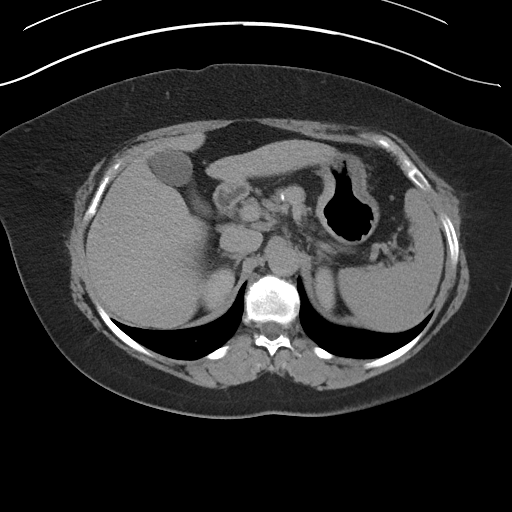
[im 76/98  lung]
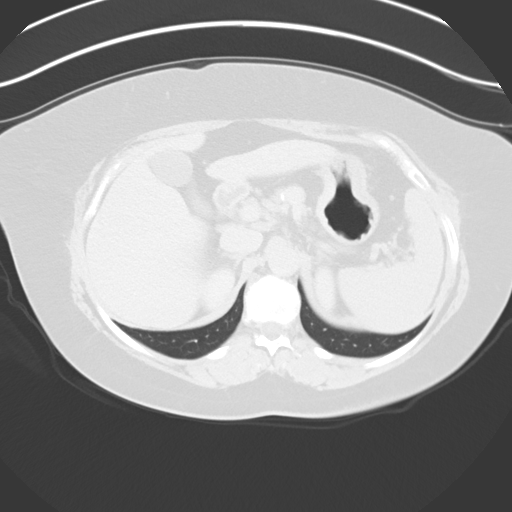
[im 87/98  soft-tissue]
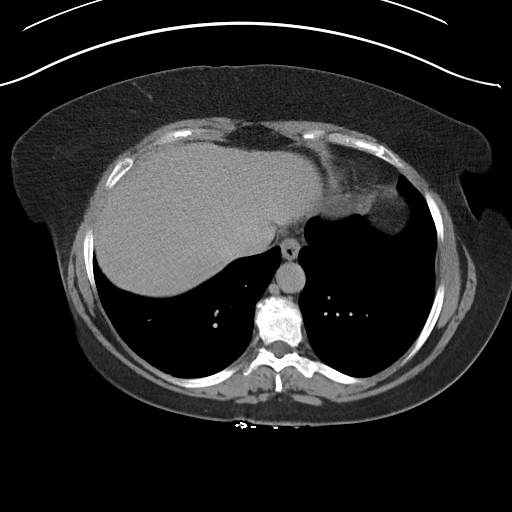
[im 87/98  lung]
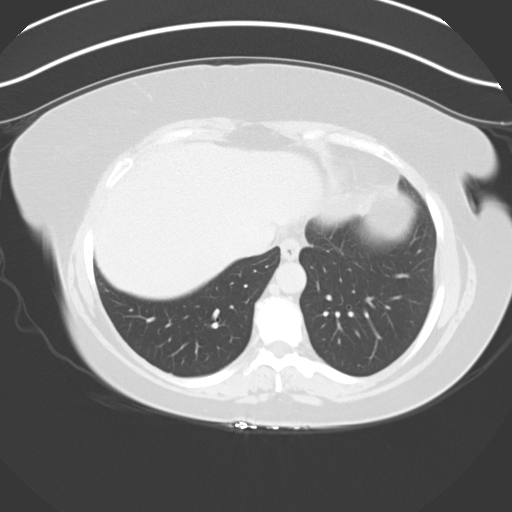

[10 of 46 positions shown; findings below may reference images not displayed]

FINDINGS: Lower chest:  No acute findings.

Hepatobiliary: No masses or other significant abnormality.

Pancreas: No mass, inflammatory changes, or other significant
abnormality.

Spleen: Within normal limits in size and appearance.

Adrenals/Urinary Tract: No adrenal or renal masses are identified.
No masses are seen involving the lower urinary tract or bladder.

Small less than 1 cm intrarenal calculi are seen bilaterally. A 3 mm
calculus is seen in the distal right ureter near the ureterovesical
junction causing mild right hydroureteronephrosis.

Stomach/Bowel: No evidence of obstruction, inflammatory process, or
abnormal fluid collections.

Vascular/Lymphatic: No pathologically enlarged lymph nodes. No
evidence of abdominal aortic aneurysm. A peripherally calcified
proximal splenic artery aneurysm is seen adjacent to the pancreatic
body which measures approximately 1.8 cm on image 19/series 5.

Reproductive: No mass or other significant abnormality.

Other: None.

Musculoskeletal:  No suspicious bone lesions identified.
IMPRESSION: No radiographic evidence urinary tract carcinoma.

3 mm distal right ureteral calculus causing mild right
hydroureteronephrosis. Small nonobstructive bilateral intrarenal
calculi also noted.

1.8 cm peripherally calcified splenic artery aneurysm. Followup by
abdomen CTA recommended in 12 months. This recommendation follows
ACR consensus guidelines: White Paper of the ACR Incidental Findings

## 2016-08-27 ENCOUNTER — Encounter: Payer: Self-pay | Admitting: Family Medicine

## 2016-09-10 ENCOUNTER — Encounter: Payer: Self-pay | Admitting: Family Medicine

## 2016-09-10 DIAGNOSIS — M069 Rheumatoid arthritis, unspecified: Secondary | ICD-10-CM

## 2016-10-05 ENCOUNTER — Encounter: Payer: Self-pay | Admitting: Family Medicine

## 2016-10-07 MED ORDER — METHOTREXATE 2.5 MG PO TABS: ORAL_TABLET | ORAL | 0 refills | Status: DC

## 2016-10-07 NOTE — Telephone Encounter (Signed)
Need frequency of dose.

## 2016-10-07 NOTE — Telephone Encounter (Signed)
Spoke with Ms. Gens.  She states she takes 3 tablets on Wed and 2 tablets on Thur.  Refill sent to [facility].

## 2016-11-17 NOTE — Progress Notes (Signed)
Office Visit Note  Patient: Amy Hart             Date of Birth: [birth year]           MRN: [id number]             PCP: Jinny Sanders, MD Referring: Jinny Sanders, MD Visit Date: 11/21/2016 Occupation: Trade show manager    Subjective:  Pain and swelling in hands.   History of Present Illness: Amy Hart is a 56 y.o. female accompanied by her wife [name], seen in consultation per request of her PCP. According to patient about 6 years ago she started having increased pain in her hands. She states at the time she was seen by her PCP and the labs were consistent with rheumatoid arthritis. She was referred to Dr. Charlestine Night who after evaluation started her on methotrexate soon after Humira was added. Patient states that she was on Humira every other week in the beginning but she was having frequent infections. Her Humira was displaced out to every 3 weeks. She has noticed decreased frequency of infections since then. She's been experiencing pain and swelling in her bilateral thumbs which she describes over the first MCP joint and bilateral fifth finger. She has some discomfort in her knees and feet but much swelling.  Activities of Daily Living:  Patient reports morning stiffness for 20 minutes.   Patient Reports nocturnal pain.  Difficulty dressing/grooming: Denies Difficulty climbing stairs: Denies Difficulty getting out of chair: Denies Difficulty using hands for taps, buttons, cutlery, and/or writing: Denies   Review of Systems  Constitutional: Positive for fatigue. Negative for night sweats, weight gain, weight loss and weakness.  HENT: Negative for mouth sores, trouble swallowing, trouble swallowing, mouth dryness and nose dryness.   Eyes: Negative for pain, redness, visual disturbance and dryness.  Respiratory: Negative for cough, shortness of breath and difficulty breathing.   Cardiovascular: Negative for chest pain, palpitations, hypertension, irregular heartbeat and  swelling in legs/feet.  Gastrointestinal: Negative for blood in stool, constipation and diarrhea.  Endocrine: Negative for increased urination.  Genitourinary: Negative for vaginal dryness.  Musculoskeletal: Positive for arthralgias, joint pain, joint swelling and morning stiffness. Negative for myalgias, muscle weakness, muscle tenderness and myalgias.  Skin: Negative for color change, rash, hair loss, skin tightness, ulcers and sensitivity to sunlight.  Allergic/Immunologic: Negative for susceptible to infections.  Neurological: Negative for dizziness, memory loss and night sweats.  Hematological: Negative for swollen glands.  Psychiatric/Behavioral: Negative for depressed mood and sleep disturbance. The patient is not nervous/anxious.     PMFS History:  Patient Active Problem List   Diagnosis Date Noted  . Hyperkalemia 12/07/2015  . Obesity (BMI 35.0-39.9 without comorbidity) 12/07/2015  . Epistaxis, recurrent 12/05/2014  . Menopausal syndrome 12/05/2014  . Rheumatoid arthritis ([place]) 01/02/2012  . PURE HYPERCHOLESTEROLEMIA 08/29/2009  . 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 ROTATOR CUFF SYNDROME 08/04/2007  . Hypothyroidism 05/19/2007  . ALLERGIC RHINITIS, SEASONAL 05/19/2007    Past Medical History:  Diagnosis Date  . Allergic rhinitis due to pollen   . Atrial fibrillation ([place])   . Collagen vascular disease ([place])   . Rheumatoid arthritis(714.0)   . Unspecified hypothyroidism     Family History  Problem Relation Age of Onset  . Hypertension Mother   . Thyroid disease Mother   . Nephrolithiasis Mother   . Diabetes Father    Past Surgical History:  Procedure Laterality Date  . BREAST BIOPSY  2003   right, negative  .  ENDOMETRIAL ABLATION  2005  . FRACTURE SURGERY  01/01   right elbow  . ROTATOR CUFF REPAIR  06/2008   R. Cuff tear  . TAYLOR BUNIONECTOMY     right  . TREATMENT FISTULA ANAL  1993   repair   Social History   Social History Narrative   No exercise    Diet: Moderate   She has a Sales promotion account executive   Lives with her partner     Objective: Vital Signs: BP 130/75 (BP Location: Left Arm, Patient Position: Sitting, Cuff Size: Normal)   Pulse 97   Resp 15   Ht 5\' 5"  (1.651 m)   Wt 214 lb (97.1 kg)   BMI 35.61 kg/m    Physical Exam  Constitutional: She is oriented to person, place, and time. She appears well-developed and well-nourished.  HENT:  Head: Normocephalic and atraumatic.  Eyes: Conjunctivae and EOM are normal.  Neck: Normal range of motion.  Cardiovascular: Normal rate, regular rhythm, normal heart sounds and intact distal pulses.   Pulmonary/Chest: Effort normal and breath sounds normal.  Abdominal: Soft. Bowel sounds are normal.  Lymphadenopathy:    She has no cervical adenopathy.  Neurological: She is alert and oriented to person, place, and time.  Skin: Skin is warm and dry. Capillary refill takes less than 2 seconds.  Psychiatric: She has a normal mood and affect. Her behavior is normal.  Nursing note and vitals reviewed.    Musculoskeletal Exam: C-spine and thoracic lumbar spine good range of motion. Shoulder joints with good range of motion. She has right elbow joint about 10 contracture from previous trauma. She has no synovitis over her wrist joints. She is some tenderness over bilateral first MCP joint but no synovitis was noted some PIP/DIP changes were noted due to underlying osteoarthritis. Hip joints knee joints ankles MTPs PIPs DIPs with good range of motion. She has some discomfort on pressure over her PIP joints.   CDAI Exam: CDAI Homunculus Exam:   Tenderness:  Right hand: 1st MCP Left hand: 1st MCP  Joint Counts:  CDAI Tender Joint count: 2 CDAI Swollen Joint count: 0  Global Assessments:  Patient Global Assessment: 3 Provider Global Assessment: 3  CDAI Calculated Score: 8    Investigation: Findings:  May 2011 anti-CCP +30, RF +140 12/07/2015 BMP normal 11/30/2015 lipid panel LDL  105, TSH normal, HCV antibody negative X-ray bilateral hands and by Dr. Charlestine Night 04/20/2016 mild right second and third MCP joint narrowing. PIP/DIP narrowing. No erosive changes were noted. No significant intercarpal joint space narrowing was noted. No erosive changes were noted.    Imaging: Xr Foot 2 Views Left  Result Date: 11/21/2016 First MTP narrowing and minimal PIP/DIP narrowing was noted. No  erosive changes were noted. Impression these findings are consistent with mild osteoarthritis.  Xr Foot 2 Views Right  Result Date: 11/21/2016 First MTP narrowing and minimal PIP/DIP narrowing was noted. No  erosive changes were noted. Impression these findings are consistent with mild osteoarthritis.   Speciality Comments: No specialty comments available.    Procedures:  No procedures performed Allergies: Naproxen   Assessment / Plan:     Visit Diagnoses: Rheumatoid arthritis involving multiple sites with positive rheumatoid factor (HCC) - Positive RF, positive anti-CCP. Previous patient of Dr. Charlestine Night. - Patient complains of some discomfort in her hands especially her thumb and bilateral fifth PIP joint I do not see any synovitis on examination although she's does have tenderness. She also has some osteoarthritic changes in  her hands. She's been taking Humira every third week due to frequent infections in the past. We discussed increasing Humira to every other week during the summer months and then she can go back to every third week later that may help some of her symptoms. A handout on Humira and methotrexate was given consent was taken. Plan: Urinalysis, Routine w reflex microscopic, Sedimentation rate, ANA.  High risk medication use - Humira 40 mg subcutaneous every 3 weeks, methotrexate 5 tablets by mouth every week, folate acid 1 mg by mouth daily - Plan: CBC with Differential/Platelet, COMPLETE METABOLIC PANEL WITH GFR, Quantiferon tb gold assay (blood), IgG, IgA, IgM, Serum protein  electrophoresis with reflex, Hepatitis B core antibody, IgM, Hepatitis B surface antigen, Hepatitis C antibody, HIV antibody. I will refill her medications after her labs.  Pain in both hands: I will schedule ultrasound of bilateral hands to look for synovitis. Her x-ray seems to be stable which were done at Dr. Elmon Else office. She is some tenderness but not synovitis today.  Pain in both feet - Plan: XR Foot 2 Views Right, XR Foot 2 Views Left. X-ray showed only mild osteoarthritis.  Primary osteoarthritis of both hands  Kidney stone - History of hematuria  Other fatigue - Plan: TSH    Orders: Orders Placed This Encounter  Procedures  . XR Foot 2 Views Right  . XR Foot 2 Views Left  . DG Chest 2 View  . CBC with Differential/Platelet  . COMPLETE METABOLIC PANEL WITH GFR  . Urinalysis, Routine w reflex microscopic  . Sedimentation rate  . TSH  . ANA  . Quantiferon tb gold assay (blood)  . IgG, IgA, IgM  . Serum protein electrophoresis with reflex  . Hepatitis B core antibody, IgM  . Hepatitis B surface antigen  . Hepatitis C antibody  . HIV antibody  . CBC with Differential/Platelet  . COMPLETE METABOLIC PANEL WITH GFR   Meds ordered this encounter  Medications  . folic acid (FOLVITE) 1 MG tablet    Sig: Take 1 tablet (1 mg total) by mouth daily.    Dispense:  90 tablet    Refill:  3    Face-to-face time spent with patient was 50 minutes. Greater than 50% of time was spent in counseling and coordination of care.  Follow-Up Instructions: Return in about 3 months (around 02/21/2017) for Rheumatoid arthritis.   Bo Merino, MD  Note - This record has been created using Editor, commissioning.  Chart creation errors have been sought, but may not always  have been located. Such creation errors do not reflect on  the standard of medical care.

## 2016-11-21 ENCOUNTER — Encounter: Payer: Self-pay | Admitting: Rheumatology

## 2016-11-21 ENCOUNTER — Telehealth: Payer: Self-pay | Admitting: Radiology

## 2016-11-21 ENCOUNTER — Ambulatory Visit (INDEPENDENT_AMBULATORY_CARE_PROVIDER_SITE_OTHER): Payer: Self-pay

## 2016-11-21 ENCOUNTER — Ambulatory Visit (HOSPITAL_COMMUNITY)
Admission: RE | Admit: 2016-11-21 | Discharge: 2016-11-21 | Disposition: A | Discharge status: Home / Self Care | Payer: BLUE CROSS/BLUE SHIELD | Source: Ambulatory Visit | Attending: Rheumatology | Admitting: Rheumatology

## 2016-11-21 ENCOUNTER — Ambulatory Visit (INDEPENDENT_AMBULATORY_CARE_PROVIDER_SITE_OTHER): Payer: BLUE CROSS/BLUE SHIELD | Admitting: Rheumatology

## 2016-11-21 VITALS — BP 130/75 | HR 97 | Resp 15 | Ht 65.0 in | Wt 214.0 lb

## 2016-11-21 DIAGNOSIS — M79671 Pain in right foot: Secondary | ICD-10-CM | POA: Diagnosis not present

## 2016-11-21 DIAGNOSIS — M79641 Pain in right hand: Secondary | ICD-10-CM | POA: Diagnosis not present

## 2016-11-21 DIAGNOSIS — Z111 Encounter for screening for respiratory tuberculosis: Secondary | ICD-10-CM | POA: Insufficient documentation

## 2016-11-21 DIAGNOSIS — N200 Calculus of kidney: Secondary | ICD-10-CM

## 2016-11-21 DIAGNOSIS — M79642 Pain in left hand: Secondary | ICD-10-CM | POA: Diagnosis not present

## 2016-11-21 DIAGNOSIS — M19042 Primary osteoarthritis, left hand: Secondary | ICD-10-CM

## 2016-11-21 DIAGNOSIS — M0579 Rheumatoid arthritis with rheumatoid factor of multiple sites without organ or systems involvement: Secondary | ICD-10-CM | POA: Diagnosis not present

## 2016-11-21 DIAGNOSIS — R5383 Other fatigue: Secondary | ICD-10-CM

## 2016-11-21 DIAGNOSIS — Z79899 Other long term (current) drug therapy: Secondary | ICD-10-CM | POA: Diagnosis not present

## 2016-11-21 DIAGNOSIS — M19041 Primary osteoarthritis, right hand: Secondary | ICD-10-CM

## 2016-11-21 DIAGNOSIS — M79672 Pain in left foot: Secondary | ICD-10-CM

## 2016-11-21 LAB — CBC WITH DIFFERENTIAL/PLATELET
Basophils Absolute: 42 cells/uL (ref 0–200)
Basophils Relative: 1 %
EOS PCT: 3 %
Eosinophils Absolute: 126 cells/uL (ref 15–500)
HCT: 45.4 % — ABNORMAL HIGH (ref 35.0–45.0)
Hemoglobin: 15.3 g/dL (ref 11.7–15.5)
LYMPHS PCT: 33 %
Lymphs Abs: 1386 cells/uL (ref 850–3900)
MCH: 33.9 pg — AB (ref 27.0–33.0)
MCHC: 33.7 g/dL (ref 32.0–36.0)
MCV: 100.7 fL — AB (ref 80.0–100.0)
MONOS PCT: 7 %
MPV: 11.6 fL (ref 7.5–12.5)
Monocytes Absolute: 294 cells/uL (ref 200–950)
NEUTROS PCT: 56 %
Neutro Abs: 2352 cells/uL (ref 1500–7800)
PLATELETS: 226 10*3/uL (ref 140–400)
RBC: 4.51 MIL/uL (ref 3.80–5.10)
RDW: 14.4 % (ref 11.0–15.0)
WBC: 4.2 10*3/uL (ref 3.8–10.8)

## 2016-11-21 LAB — TSH: TSH: 0.38 m[IU]/L — AB

## 2016-11-21 MED ORDER — FOLIC ACID 1 MG PO TABS: 1.0000 mg | ORAL_TABLET | Freq: Every day | ORAL | 3 refills | Status: DC

## 2016-11-21 NOTE — Patient Instructions (Addendum)
Please go to [facility] Radiology Department to get a chest x-ray  Standing Labs We placed an order today for your standing lab work.    Please come back and get your standing labs in November 2018 and every 3 months  We have open lab Monday through Friday from 8:30-11:30 AM and 1:30-4 PM at the office of Dr. Bo Merino.   The office is located at [address], [place], [place],  [address] No appointment is necessary.   Labs are drawn by [facility].  You may receive a bill from [facility] for your lab work. If you have any questions regarding directions or hours of operation,  please call [phone].    Adalimumab Injection What is this medicine? ADALIMUMAB (a dal AYE mu mab) is used to treat rheumatoid and psoriatic arthritis. It is also used to treat ankylosing spondylitis, Crohn's disease, ulcerative colitis, plaque psoriasis, hidradenitis suppurativa, and uveitis. This medicine may be used for other purposes; ask your health care provider or pharmacist if you have questions. COMMON BRAND NAME(S): CYLTEZO, Humira What should I tell my health care provider before I take this medicine? They need to know if you have any of these conditions: -diabetes -heart disease -hepatitis B or history of hepatitis B infection -immune system problems -infection or history of infections -multiple sclerosis -recently received or scheduled to receive a vaccine -scheduled to have surgery -tuberculosis, a positive skin test for tuberculosis or have recently been in close contact with someone who has tuberculosis -an unusual reaction to adalimumab, other medicines, mannitol, latex, rubber, foods, dyes, or preservatives -pregnant or trying to get pregnant -breast-feeding How should I use this medicine? This medicine is for injection under the skin. You will be taught how to prepare and give this medicine. Use exactly as directed. Take your medicine at regular intervals. Do not take your  medicine more often than directed. A special MedGuide will be given to you by the pharmacist with each prescription and refill. Be sure to read this information carefully each time. It is important that you put your used needles and syringes in a special sharps container. Do not put them in a trash can. If you do not have a sharps container, call your pharmacist or healthcare provider to get one. Talk to your pediatrician regarding the use of this medicine in children. While this drug may be prescribed for children as young as 2 years for selected conditions, precautions do apply. The manufacturer of the medicine offers free information to patients and their health care partners. Call 573-284-2544 for more information. Overdosage: If you think you have taken too much of this medicine contact a poison control center or emergency room at once. NOTE: This medicine is only for you. Do not share this medicine with others. What if I miss a dose? If you miss a dose, take it as soon as you can. If it is almost time for your next dose, take only that dose. Do not take double or extra doses. Give the next dose when your next scheduled dose is due. Call your doctor or health care professional if you are not sure how to handle a missed dose. What may interact with this medicine? Do not take this medicine with any of the following medications: -abatacept -anakinra -etanercept -infliximab -live virus vaccines -rilonacept This medicine may also interact with the following medications: -vaccines This list may not describe all possible interactions. Give your health care provider a list of all the medicines, herbs, non-prescription  drugs, or dietary supplements you use. Also tell them if you smoke, drink alcohol, or use illegal drugs. Some items may interact with your medicine. What should I watch for while using this medicine? Visit your doctor or health care professional for regular checks on your progress.  Tell your doctor or healthcare professional if your symptoms do not start to get better or if they get worse. You will be tested for tuberculosis (TB) before you start this medicine. If your doctor prescribes any medicine for TB, you should start taking the TB medicine before starting this medicine. Make sure to finish the full course of TB medicine. Call your doctor or health care professional if you get a cold or other infection while receiving this medicine. Do not treat yourself. This medicine may decrease your body's ability to fight infection. Talk to your doctor about your risk of cancer. You may be more at risk for certain types of cancers if you take this medicine. What side effects may I notice from receiving this medicine? Side effects that you should report to your doctor or health care professional as soon as possible: -allergic reactions like skin rash, itching or hives, swelling of the face, lips, or tongue -breathing problems -changes in vision -chest pain -fever, chills, or any other sign of infection -numbness or tingling -red, scaly patches or raised bumps on the skin -swelling of the ankles -swollen lymph nodes in the neck, underarm, or groin areas -unexplained weight loss -unusual bleeding or bruising -unusually weak or tired Side effects that usually do not require medical attention (report to your doctor or health care professional if they continue or are bothersome): -headache -nausea -redness, itching, swelling, or bruising at site where injected This list may not describe all possible side effects. Call your doctor for medical advice about side effects. You may report side effects to FDA at 1-800-FDA-1088. Where should I keep my medicine? Keep out of the reach of children. Store in the original container and in the refrigerator between 2 and 8 degrees C (36 and 46 degrees F). Do not freeze. The product may be stored in a cool carrier with an ice pack, if needed.  Protect from light. Throw away any unused medicine after the expiration date. NOTE: This sheet is a summary. It may not cover all possible information. If you have questions about this medicine, talk to your doctor, pharmacist, or health care provider.  2018 Elsevier/Gold Standard (2014-10-11 11:11:43)  Methotrexate tablets What is this medicine? METHOTREXATE (METH oh TREX ate) is a chemotherapy drug used to treat cancer including breast cancer, leukemia, and lymphoma. This medicine can also be used to treat psoriasis and certain kinds of arthritis. This medicine may be used for other purposes; ask your health care provider or pharmacist if you have questions. COMMON BRAND NAME(S): Rheumatrex, Trexall What should I tell my health care provider before I take this medicine? They need to know if you have any of these conditions: -fluid in the stomach area or lungs -if you often drink alcohol -infection or immune system problems -kidney disease or on hemodialysis -liver disease -low blood counts, like low white cell, platelet, or red cell counts -lung disease -radiation therapy -stomach ulcers -ulcerative colitis -an unusual or allergic reaction to methotrexate, other medicines, foods, dyes, or preservatives -pregnant or trying to get pregnant -breast-feeding How should I use this medicine? Take this medicine by mouth with a glass of water. Follow the directions on the prescription label. Take your medicine at  regular intervals. Do not take it more often than directed. Do not stop taking except on your doctor's advice. Make sure you know why you are taking this medicine and how often you should take it. If this medicine is used for a condition that is not cancer, like arthritis or psoriasis, it should be taken weekly, NOT daily. Taking this medicine more often than directed can cause serious side effects, even death. Talk to your healthcare provider about safe handling and disposal of this  medicine. You may need to take special precautions. Talk to your pediatrician regarding the use of this medicine in children. While this drug may be prescribed for selected conditions, precautions do apply. Overdosage: If you think you have taken too much of this medicine contact a poison control center or emergency room at once. NOTE: This medicine is only for you. Do not share this medicine with others. What if I miss a dose? If you miss a dose, talk with your doctor or health care professional. Do not take double or extra doses. What may interact with this medicine? This medicine may interact with the following medication: -acitretin -aspirin and aspirin-like medicines including salicylates -azathioprine -certain antibiotics like penicillins, tetracycline, and chloramphenicol -cyclosporine -gold -hydroxychloroquine -live virus vaccines -NSAIDs, medicines for pain and inflammation, like ibuprofen or naproxen -other cytotoxic agents -penicillamine -phenylbutazone -phenytoin -probenecid -retinoids such as isotretinoin and tretinoin -steroid medicines like prednisone or cortisone -sulfonamides like sulfasalazine and trimethoprim/sulfamethoxazole -theophylline This list may not describe all possible interactions. Give your health care provider a list of all the medicines, herbs, non-prescription drugs, or dietary supplements you use. Also tell them if you smoke, drink alcohol, or use illegal drugs. Some items may interact with your medicine. What should I watch for while using this medicine? Avoid alcoholic drinks. This medicine can make you more sensitive to the sun. Keep out of the sun. If you cannot avoid being in the sun, wear protective clothing and use sunscreen. Do not use sun lamps or tanning beds/booths. You may need blood work done while you are taking this medicine. Call your doctor or health care professional for advice if you get a fever, chills or sore throat, or other  symptoms of a cold or flu. Do not treat yourself. This drug decreases your body's ability to fight infections. Try to avoid being around people who are sick. This medicine may increase your risk to bruise or bleed. Call your doctor or health care professional if you notice any unusual bleeding. Check with your doctor or health care professional if you get an attack of severe diarrhea, nausea and vomiting, or if you sweat a lot. The loss of too much body fluid can make it dangerous for you to take this medicine. Talk to your doctor about your risk of cancer. You may be more at risk for certain types of cancers if you take this medicine. Both men and women must use effective birth control with this medicine. Do not become pregnant while taking this medicine or until at least 1 normal menstrual cycle has occurred after stopping it. Women should inform their doctor if they wish to become pregnant or think they might be pregnant. Men should not father a child while taking this medicine and for 3 months after stopping it. There is a potential for serious side effects to an unborn child. Talk to your health care professional or pharmacist for more information. Do not breast-feed an infant while taking this medicine. What side effects may I  notice from receiving this medicine? Side effects that you should report to your doctor or health care professional as soon as possible: -allergic reactions like skin rash, itching or hives, swelling of the face, lips, or tongue -breathing problems or shortness of breath -diarrhea -dry, nonproductive cough -low blood counts - this medicine may decrease the number of white blood cells, red blood cells and platelets. You may be at increased risk for infections and bleeding. -mouth sores -redness, blistering, peeling or loosening of the skin, including inside the mouth -signs of infection - fever or chills, cough, sore throat, pain or trouble passing urine -signs and symptoms  of bleeding such as bloody or black, tarry stools; red or dark-brown urine; spitting up blood or brown material that looks like coffee grounds; red spots on the skin; unusual bruising or bleeding from the eye, gums, or nose -signs and symptoms of kidney injury like trouble passing urine or change in the amount of urine -signs and symptoms of liver injury like dark yellow or brown urine; general ill feeling or flu-like symptoms; light-colored stools; loss of appetite; nausea; right upper belly pain; unusually weak or tired; yellowing of the eyes or skin Side effects that usually do not require medical attention (report to your doctor or health care professional if they continue or are bothersome): -dizziness -hair loss -tiredness -upset stomach -vomiting This list may not describe all possible side effects. Call your doctor for medical advice about side effects. You may report side effects to FDA at 1-800-FDA-1088. Where should I keep my medicine? Keep out of the reach of children. Store at room temperature between 20 and 25 degrees C (68 and 77 degrees F). Protect from light. Throw away any unused medicine after the expiration date. NOTE: This sheet is a summary. It may not cover all possible information. If you have questions about this medicine, talk to your doctor, pharmacist, or health care provider.  2018 Elsevier/Gold Standard (2014-11-27 05:39:22)

## 2016-11-21 NOTE — Progress Notes (Signed)
WNLs

## 2016-11-21 NOTE — Telephone Encounter (Signed)
I checked out patient today and she needs a follow up in November and an [place] scheduled   Please call her

## 2016-11-21 NOTE — Progress Notes (Signed)
Pharmacy Note Subjective: Patient presents today to the [facility] to see Dr. Estanislado Pandy.   Patient is currently prescribed Humira 40 mg every 3 weeks, methotrexate 5 tablets weekly, and folic acid.  Patient seen by the pharmacist for counseling on Humira and methotrexate.    Objective: CBC, CMP ordered today  TB Test: ordered today Hepatitis panel: ordered today HIV: ordered today SPEP: ordered today Immunoglobulins: ordered today  Chest x-ray: ordered today  Contraception: discussed  Alcohol use: 2 drink per week  Does patient have diagnosis of heart failure?  No  Assessment/Plan:  Counseled patient that Humira is a TNF blocking agent.  Counseled patient on purpose, proper use, and adverse effects of Humira.  Reviewed the most common adverse effects including infections, headache, and injection site reactions. Discussed that there is the possibility of an increased risk of malignancy but it is not well understood if this increased risk is due to the medication or the disease state.  Advised patient to get yearly dermatology exams due to risk of skin cancer.  Counseled patient that Humira should be held prior to scheduled surgery.  Counseled patient to avoid live vaccines while on Humira.  Advised patient to get annual influenza vaccine and the pneumococcal vaccine as needed.  Provided patient with medication education material and answered all questions.  Patient voiced understanding.  Patient consented to Humira.  Will upload consent into the media tab.  Reviewed storage instructions of Humira.  Patient voiced understanding.  Patient was counseled on the purpose, proper use, and adverse effects of methotrexate including nausea, infection, and signs and symptoms of pneumonitis.  Reviewed instructions with patient to take methotrexate weekly along with folic acid daily.  Patient is currently taking folic acid 161 mcg daily.  Advised patient to increase folic acid dose to 1 mg  daily.  Counseled patient to avoid NSAIDs and alcohol while on methotrexate.  Provided patient with educational materials on methotrexate and answered all questions.  Patient voiced understanding.  Patient consented to methotrexate use.  Will upload into chart.    Reviewed the importance of regular labs while on Humira and methotrexate therapy.  Provided patient with standing lab orders for November then every 3 months.    Elisabeth Most, Pharm.D., BCPS, CPP Clinical Pharmacist Pager: [phone] Phone: [phone] 11/21/2016 9:41 AM

## 2016-11-22 LAB — COMPLETE METABOLIC PANEL WITH GFR
ALBUMIN: 4.3 g/dL (ref 3.6–5.1)
ALK PHOS: 78 U/L (ref 33–130)
ALT: 59 U/L — ABNORMAL HIGH (ref 6–29)
AST: 39 U/L — ABNORMAL HIGH (ref 10–35)
BILIRUBIN TOTAL: 0.6 mg/dL (ref 0.2–1.2)
BUN: 16 mg/dL (ref 7–25)
CO2: 20 mmol/L (ref 20–32)
CREATININE: 0.92 mg/dL (ref 0.50–1.05)
Calcium: 9.4 mg/dL (ref 8.6–10.4)
Chloride: 106 mmol/L (ref 98–110)
GFR, EST NON AFRICAN AMERICAN: 70 mL/min (ref >=60)
GFR, Est African American: 81 mL/min (ref >=60)
Glucose, Bld: 101 mg/dL — ABNORMAL HIGH (ref 65–99)
Potassium: 4.4 mmol/L (ref 3.5–5.3)
Sodium: 140 mmol/L (ref 135–146)
TOTAL PROTEIN: 6.9 g/dL (ref 6.1–8.1)

## 2016-11-22 LAB — URINALYSIS, ROUTINE W REFLEX MICROSCOPIC
BILIRUBIN URINE: NEGATIVE
Glucose, UA: NEGATIVE
KETONES UR: NEGATIVE
NITRITE: NEGATIVE
PH: 7 (ref 5.0–8.0)
Protein, ur: NEGATIVE
Specific Gravity, Urine: 1.016 (ref 1.001–1.035)

## 2016-11-22 LAB — HEPATITIS C ANTIBODY: HCV Ab: NONREACTIVE

## 2016-11-22 LAB — URINALYSIS, MICROSCOPIC ONLY
Bacteria, UA: NONE SEEN [HPF]
CASTS: NONE SEEN [LPF]
Crystals: NONE SEEN [HPF]
YEAST: NONE SEEN [HPF]

## 2016-11-22 LAB — HEPATITIS B CORE ANTIBODY, IGM: HEP B C IGM: NONREACTIVE

## 2016-11-22 LAB — HIV ANTIBODY (ROUTINE TESTING W REFLEX): HIV 1&2 Ab, 4th Generation: NONREACTIVE

## 2016-11-22 LAB — HEPATITIS B SURFACE ANTIGEN: Hepatitis B Surface Ag: NONREACTIVE

## 2016-11-22 LAB — SEDIMENTATION RATE: Sed Rate: 4 mm/hr (ref 0–30)

## 2016-11-23 LAB — QUANTIFERON TB GOLD ASSAY (BLOOD)
Interferon Gamma Release Assay: NEGATIVE
Mitogen-Nil: >10 IU/mL
QUANTIFERON NIL VALUE: 0.05 [IU]/mL
QUANTIFERON TB AG MINUS NIL: 0.01 [IU]/mL

## 2016-11-24 LAB — IGG, IGA, IGM
IGA: 178 mg/dL (ref 81–463)
IGG (IMMUNOGLOBIN G), SERUM: 1012 mg/dL (ref 694–1618)
IgM, Serum: 184 mg/dL (ref 48–271)

## 2016-11-25 LAB — ANTI-NUCLEAR AB-TITER (ANA TITER)

## 2016-11-25 LAB — ANA: Anti Nuclear Antibody(ANA): POSITIVE — AB

## 2016-11-26 ENCOUNTER — Other Ambulatory Visit: Payer: Self-pay | Admitting: *Deleted

## 2016-11-26 ENCOUNTER — Encounter: Payer: Self-pay | Admitting: Rheumatology

## 2016-11-26 DIAGNOSIS — M2550 Pain in unspecified joint: Secondary | ICD-10-CM

## 2016-11-26 LAB — PROTEIN ELECTROPHORESIS, SERUM, WITH REFLEX
Albumin ELP: 4.3 g/dL (ref 3.8–4.8)
Alpha-1-Globulin: 0.2 g/dL (ref 0.2–0.3)
Alpha-2-Globulin: 0.6 g/dL (ref 0.5–0.9)
BETA 2: 0.3 g/dL (ref 0.2–0.5)
Beta Globulin: 0.4 g/dL (ref 0.4–0.6)
GAMMA GLOBULIN: 1 g/dL (ref 0.8–1.7)
Total Protein, Serum Electrophoresis: 6.8 g/dL (ref 6.1–8.1)

## 2016-11-26 MED ORDER — CYCLOBENZAPRINE HCL 10 MG PO TABS: 10.0000 mg | ORAL_TABLET | Freq: Every day | ORAL | 2 refills | Status: DC

## 2016-11-26 MED ORDER — METHOTREXATE 2.5 MG PO TABS: 12.5000 mg | ORAL_TABLET | ORAL | 0 refills | Status: DC

## 2016-11-26 MED ORDER — ADALIMUMAB 40 MG/0.8ML ~~LOC~~ AJKT: 40.0000 mg | AUTO-INJECTOR | SUBCUTANEOUS | 0 refills | Status: DC

## 2016-11-26 NOTE — Progress Notes (Signed)
Please add ENA, C3 and C4.

## 2016-11-26 NOTE — Telephone Encounter (Signed)
Last Visit: 11/21/16 Next Visit: 02/23/17 Labs: 11/21/16 AST 39 ALT 59 TB Gold 11/21/16 Neg  Okay to refill MTX, Humira and Cyclobenzaprine per Dr. Estanislado Pandy

## 2016-11-27 LAB — CP5000020 ENA PANEL
DS DNA AB: 1 [IU]/mL
ENA SM AB SER-ACNC: NEGATIVE
RIBONUCLEIC PROTEIN(ENA) ANTIBODY, IGG: NEGATIVE
SCLERODERMA (SCL-70) (ENA) ANTIBODY, IGG: NEGATIVE
SSA (RO) (ENA) ANTIBODY, IGG: NEGATIVE
SSB (La) (ENA) Antibody, IgG: <1

## 2016-11-27 LAB — C3 AND C4
C3 COMPLEMENT: 116 mg/dL (ref 83–193)
C4 COMPLEMENT: 18 mg/dL (ref 15–57)

## 2016-11-27 NOTE — Progress Notes (Signed)
Okay to call in prescription for methotrexate and Humira as mentioned in the note.

## 2016-12-11 ENCOUNTER — Other Ambulatory Visit: Payer: Self-pay | Admitting: Family Medicine

## 2016-12-25 ENCOUNTER — Ambulatory Visit: Payer: Self-pay | Admitting: Rheumatology

## 2017-01-04 ENCOUNTER — Telehealth: Payer: Self-pay | Admitting: Family Medicine

## 2017-01-04 DIAGNOSIS — E7800 Pure hypercholesterolemia, unspecified: Secondary | ICD-10-CM

## 2017-01-04 DIAGNOSIS — E039 Hypothyroidism, unspecified: Secondary | ICD-10-CM

## 2017-01-04 NOTE — Telephone Encounter (Signed)
-----   Message from [name] sent at 12/29/2016 10:51 AM EDT ----- Regarding: Lab orders for Monday, 10.1.18 Patient is scheduled for CPX labs, please order future labs, Thanks , Karna Christmas

## 2017-01-05 ENCOUNTER — Other Ambulatory Visit (INDEPENDENT_AMBULATORY_CARE_PROVIDER_SITE_OTHER): Payer: BLUE CROSS/BLUE SHIELD

## 2017-01-05 DIAGNOSIS — E039 Hypothyroidism, unspecified: Secondary | ICD-10-CM | POA: Diagnosis not present

## 2017-01-05 DIAGNOSIS — E7800 Pure hypercholesterolemia, unspecified: Secondary | ICD-10-CM

## 2017-01-05 LAB — COMPREHENSIVE METABOLIC PANEL
ALT: 55 U/L — ABNORMAL HIGH (ref 0–35)
AST: 34 U/L (ref 0–37)
Albumin: 4.3 g/dL (ref 3.5–5.2)
Alkaline Phosphatase: 68 U/L (ref 39–117)
BUN: 16 mg/dL (ref 6–23)
CHLORIDE: 105 meq/L (ref 96–112)
CO2: 26 mEq/L (ref 19–32)
Calcium: 9.9 mg/dL (ref 8.4–10.5)
Creatinine, Ser: 0.79 mg/dL (ref 0.40–1.20)
GFR: 80.09 mL/min (ref >60.00)
GLUCOSE: 108 mg/dL — AB (ref 70–99)
POTASSIUM: 4.9 meq/L (ref 3.5–5.1)
Sodium: 141 mEq/L (ref 135–145)
TOTAL PROTEIN: 6.9 g/dL (ref 6.0–8.3)
Total Bilirubin: 0.8 mg/dL (ref 0.2–1.2)

## 2017-01-05 LAB — LIPID PANEL
CHOL/HDL RATIO: 3
Cholesterol: 217 mg/dL — ABNORMAL HIGH (ref 0–200)
HDL: 71.5 mg/dL (ref >39.00)
LDL CALC: 127 mg/dL — AB (ref 0–99)
NONHDL: 145.02
TRIGLYCERIDES: 88 mg/dL (ref 0.0–149.0)
VLDL: 17.6 mg/dL (ref 0.0–40.0)

## 2017-01-05 LAB — T3, FREE: T3 FREE: 3.1 pg/mL (ref 2.3–4.2)

## 2017-01-05 LAB — TSH: TSH: 0.2 u[IU]/mL — AB (ref 0.35–4.50)

## 2017-01-05 LAB — T4, FREE: Free T4: 0.99 ng/dL (ref 0.60–1.60)

## 2017-01-06 ENCOUNTER — Other Ambulatory Visit: Payer: Self-pay | Admitting: Rheumatology

## 2017-01-07 ENCOUNTER — Other Ambulatory Visit: Payer: Self-pay | Admitting: *Deleted

## 2017-01-08 ENCOUNTER — Encounter: Payer: Self-pay | Admitting: Family Medicine

## 2017-01-08 ENCOUNTER — Ambulatory Visit (INDEPENDENT_AMBULATORY_CARE_PROVIDER_SITE_OTHER): Payer: BLUE CROSS/BLUE SHIELD | Admitting: Family Medicine

## 2017-01-08 VITALS — BP 120/78 | HR 77 | Temp 98.6°F | Ht 63.5 in | Wt 214.5 lb

## 2017-01-08 DIAGNOSIS — E7800 Pure hypercholesterolemia, unspecified: Secondary | ICD-10-CM

## 2017-01-08 DIAGNOSIS — R7303 Prediabetes: Secondary | ICD-10-CM | POA: Diagnosis not present

## 2017-01-08 DIAGNOSIS — Z0000 Encounter for general adult medical examination without abnormal findings: Secondary | ICD-10-CM

## 2017-01-08 DIAGNOSIS — M069 Rheumatoid arthritis, unspecified: Secondary | ICD-10-CM | POA: Diagnosis not present

## 2017-01-08 DIAGNOSIS — Z23 Encounter for immunization: Secondary | ICD-10-CM | POA: Diagnosis not present

## 2017-01-08 DIAGNOSIS — E669 Obesity, unspecified: Secondary | ICD-10-CM | POA: Diagnosis not present

## 2017-01-08 DIAGNOSIS — E039 Hypothyroidism, unspecified: Secondary | ICD-10-CM | POA: Diagnosis not present

## 2017-01-08 MED ORDER — LEVOTHYROXINE SODIUM 100 MCG PO TABS: ORAL_TABLET | ORAL | 3 refills | Status: DC

## 2017-01-08 NOTE — Assessment & Plan Note (Signed)
Encouraged exercise, weight loss, healthy eating habits. ? ?

## 2017-01-08 NOTE — Assessment & Plan Note (Signed)
Well controlled. Continue current medication.  

## 2017-01-08 NOTE — Assessment & Plan Note (Signed)
Work on lifestyle change. Check A1C next year.

## 2017-01-08 NOTE — Assessment & Plan Note (Signed)
Moderate control.. No indication for statin, AHA risk cal 1.4% risk of CVD in 10 years.

## 2017-01-08 NOTE — Progress Notes (Signed)
Subjective:    Patient ID: Amy Hart, female    DOB: 1960/07/30, 56 y.o.   MRN: [id number]  HPI   The patient is here for annual wellness exam and preventative care.    Elevated Cholesterol:  Moderate control.. No indication for statin, AHA risk cal 1.4% risk of CVD in 10 years. Lab Results  Component Value Date   CHOL 217 (H) 01/05/2017   HDL 71.50 01/05/2017   LDLCALC 127 (H) 01/05/2017   LDLDIRECT 131.3 11/25/2011   TRIG 88.0 01/05/2017   CHOLHDL 3 01/05/2017  Diet compliance: fruits and veggies, does not eat out. Exercise: treadmill off and on Other complaints:  Prediabetes:  Moderate diet  Hypothyroid  Stable free t3 and t4.  Lab Results  Component Value Date   TSH 0.20 (L) 01/05/2017   Rheumatoid Arthritis: on methotrexate and humira (increase back to q3 weeks) with moderate control.   She has had progression of RA.She has had improvement with turmeric.  Followed by Estanislado Pandy now.  Social History /Family History/Past Medical History reviewed in detail and updated in EMR if needed. Blood pressure 120/78, pulse 77, temperature 98.6 F (37 C), temperature source Oral, height 5' 3.5" (1.613 m), weight 214 lb 8 oz (97.3 kg).  Review of Systems  Constitutional: Negative for fatigue and fever.  HENT: Negative for congestion and ear pain.   Eyes: Negative for pain.  Respiratory: Negative for cough, chest tightness and shortness of breath.   Cardiovascular: Negative for chest pain, palpitations and leg swelling.  Gastrointestinal: Negative for abdominal pain.  Genitourinary: Negative for dysuria and vaginal bleeding.  Musculoskeletal: Negative for back pain.  Neurological: Negative for syncope, light-headedness and headaches.  Psychiatric/Behavioral: Negative for dysphoric mood.       Objective:   Physical Exam  Constitutional: Vital signs are normal. She appears well-developed and well-nourished. She is cooperative.  Non-toxic appearance. She does not appear  ill. No distress.  Central obesity  HENT:  Head: Normocephalic.  Right Ear: Hearing, tympanic membrane, external ear and ear canal normal.  Left Ear: Hearing, tympanic membrane, external ear and ear canal normal.  Nose: Nose normal.  Eyes: Pupils are equal, round, and reactive to light. Conjunctivae, EOM and lids are normal. Lids are everted and swept, no foreign bodies found.  Neck: Trachea normal and normal range of motion. Neck supple. Carotid bruit is not present. No thyroid mass and no thyromegaly present.  Cardiovascular: Normal rate, regular rhythm, S1 normal, S2 normal, normal heart sounds and intact distal pulses.  Exam reveals no gallop.   No murmur heard. Pulmonary/Chest: Effort normal and breath sounds normal. No respiratory distress. She has no wheezes. She has no rhonchi. She has no rales.  Abdominal: Soft. Normal appearance and bowel sounds are normal. She exhibits no distension, no fluid wave, no abdominal bruit and no mass. There is no hepatosplenomegaly. There is no tenderness. There is no rebound, no guarding and no CVA tenderness. No hernia.  Lymphadenopathy:    She has no cervical adenopathy.    She has no axillary adenopathy.  Neurological: She is alert. She has normal strength. No cranial nerve deficit or sensory deficit.  Skin: Skin is warm, dry and intact. No rash noted.  Psychiatric: Her speech is normal and behavior is normal. Judgment normal. Her mood appears not anxious. Cognition and memory are normal. She does not exhibit a depressed mood.          Assessment & Plan:  The patient's preventative maintenance and  recommended screening tests for an annual wellness exam were reviewed in full today. Brought up to date unless services declined.  Counselled on the importance of diet, exercise, and its role in overall health and mortality. The patient's FH and SH was reviewed, including their home life, tobacco status, and drug and alcohol status.   Vaccines: Got  flu at work, PCV 13 uptodate, PCV23 due and PCV 23 in 5 years.  Nonsmoker.  PAP/DVE: Q 5year, last done 06/2013 nml , no HPV , DVE not indicated.. Asymptomatic. Mammo: 12/2015, q2 years no family history for breast cancer. Colon: 01/2012 Dr. Carlean Purl, nml repeat in 10 years  STD screening: refused  Hep C : done

## 2017-01-08 NOTE — Addendum Note (Signed)
Addended by: Carter Kitten on: 01/08/2017 02:50 PM   Modules accepted: Orders

## 2017-01-08 NOTE — Assessment & Plan Note (Signed)
Progressive symptoms but tolerable on humira and methotrexate.

## 2017-01-08 NOTE — Patient Instructions (Addendum)
Work on low carb, increase exercise and weight loss.

## 2017-01-09 MED ORDER — METHOTREXATE 2.5 MG PO TABS: 12.5000 mg | ORAL_TABLET | ORAL | 0 refills | Status: DC

## 2017-01-09 MED ORDER — ADALIMUMAB 40 MG/0.8ML ~~LOC~~ AJKT: 40.0000 mg | AUTO-INJECTOR | SUBCUTANEOUS | 0 refills | Status: DC

## 2017-01-09 NOTE — Telephone Encounter (Signed)
ok 

## 2017-01-09 NOTE — Telephone Encounter (Signed)
Last Visit: 11/21/16 Next Visit: 02/13/17 Labs: 11/21/16 AST- 39, ALT 59 previously normal TB Gold: 11/21/16 Neg  Patient is on MTX 5 tabs, Humira  Okay to refill Humira and MTX?

## 2017-02-02 NOTE — Progress Notes (Signed)
Office Visit Note  Patient: Amy Hart             Date of Birth: Jun 24, 1960           MRN: [id number]             PCP: Jinny Sanders, MD Referring: Jinny Sanders, MD Visit Date: 02/13/2017 Occupation: @GUAROCC @    Subjective:   BIL knee and foot pain. BIL hand stiffness/pain    History of Present Illness: Amy Hart is a 56 y.o. female with history of sero positive rheumatoid arthritis. She states she's been having increased discomfort with the weather change. Follow her work she has to stand for prolonged time it causes increase discomfort in her feet and lower extremities. She's also noticed intermittent swelling in her hands.She decided to go back to Humira every other week because of increase in discomfort. She is still on methotrexate 5 tablets by mouth every week.  Activities of Daily Living:  Patient reports morning stiffness for 15 minutes.   Patient Reports nocturnal pain.  Difficulty dressing/grooming: Denies Difficulty climbing stairs: Reports Difficulty getting out of chair: Reports Difficulty using hands for taps, buttons, cutlery, and/or writing: Reports   Review of Systems  Constitutional: Positive for fatigue. Negative for night sweats, weight gain, weight loss and weakness.  HENT: Negative for mouth sores, trouble swallowing, trouble swallowing, mouth dryness and nose dryness.   Eyes: Negative for pain, redness, visual disturbance and dryness.  Respiratory: Negative for cough, shortness of breath and difficulty breathing.   Cardiovascular: Negative for chest pain, palpitations, hypertension, irregular heartbeat and swelling in legs/feet.  Gastrointestinal: Negative for blood in stool, constipation and diarrhea.  Endocrine: Negative for increased urination.  Genitourinary: Negative for vaginal dryness.  Musculoskeletal: Positive for arthralgias, joint pain, joint swelling and morning stiffness. Negative for myalgias, muscle weakness, muscle tenderness  and myalgias.  Skin: Negative for color change, rash, hair loss, skin tightness, ulcers and sensitivity to sunlight.  Allergic/Immunologic: Negative for susceptible to infections.  Neurological: Negative for dizziness, memory loss and night sweats.  Hematological: Negative for swollen glands.  Psychiatric/Behavioral: Positive for sleep disturbance. Negative for depressed mood. The patient is not nervous/anxious.     PMFS History:  Patient Active Problem List   Diagnosis Date Noted  . Prediabetes 01/08/2017  . Hyperkalemia 12/07/2015  . Obesity (BMI 35.0-39.9 without comorbidity) 12/07/2015  . Epistaxis, recurrent 12/05/2014  . Menopausal syndrome 12/05/2014  . Rheumatoid arthritis ([place]) 01/02/2012  . Pure hypercholesterolemia 08/29/2009  . 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 ROTATOR CUFF SYNDROME 08/04/2007  . Hypothyroidism 05/19/2007  . ALLERGIC RHINITIS, SEASONAL 05/19/2007    Past Medical History:  Diagnosis Date  . Allergic rhinitis due to pollen   . Collagen vascular disease ([place])   . Rheumatoid arthritis(714.0)   . Unspecified hypothyroidism     Family History  Problem Relation Age of Onset  . Hypertension Mother   . Thyroid disease Mother   . Nephrolithiasis Mother   . Diabetes Father   . Kidney Stones Sister    Past Surgical History:  Procedure Laterality Date  . BREAST BIOPSY  2003   right, negative  . ENDOMETRIAL ABLATION  2005  . FRACTURE SURGERY  01/01   right elbow  . ROTATOR CUFF REPAIR  06/2008   R. Cuff tear  . TAYLOR BUNIONECTOMY     right  . TREATMENT FISTULA ANAL  1993   repair   Social History   Social History Narrative  No exercise   Diet: Moderate   She has a Sales promotion account executive   Lives with her partner     Objective: Vital Signs: BP 137/84 (BP Location: Left Arm, Patient Position: Sitting, Cuff Size: Normal)   Pulse 67   Resp 16   Ht 5\' 4"  (1.626 m)   Wt 218 lb (98.9 kg)   BMI 37.42 kg/m    Physical Exam  Constitutional:  She is oriented to person, place, and time. She appears well-developed and well-nourished.  HENT:  Head: Normocephalic and atraumatic.  Eyes: Conjunctivae and EOM are normal.  Neck: Normal range of motion.  Cardiovascular: Normal rate, regular rhythm, normal heart sounds and intact distal pulses.  Pulmonary/Chest: Effort normal and breath sounds normal.  Abdominal: Soft. Bowel sounds are normal.  Lymphadenopathy:    She has no cervical adenopathy.  Neurological: She is alert and oriented to person, place, and time.  Skin: Skin is warm and dry. Capillary refill takes less than 2 seconds.  Psychiatric: She has a normal mood and affect. Her behavior is normal.  Nursing note and vitals reviewed.    Musculoskeletal Exam: C-spine and thoracic lumbar spine good range of motion. Shoulder joints are good range of motion. She has contracture in her right elbow which is unchanged due to previous trauma. She had no synovitis or MCP joints. She has synovial thickening over bilateral second MCP joint. She has DIP PIP thickening in her hands due to osteoarthritis. Hip joints knee joints ankles with good range of motion with no synovitis.  CDAI Exam: CDAI Homunculus Exam:   Joint Counts:  CDAI Tender Joint count: 0 CDAI Swollen Joint count: 0  Global Assessments:  Patient Global Assessment: 6 Provider Global Assessment: 2  CDAI Calculated Score: 8    Investigation: No additional findings.TB Gold: 11/2016 Negative  CBC Latest Ref Rng & Units 11/21/2016 01/29/2015 09/27/2008  WBC 3.8 - 10.8 K/uL 4.2 5.4 -  Hemoglobin 11.7 - 15.5 g/dL 15.3 14.8 13.6  Hematocrit 35.0 - 45.0 % 45.4(H) 43.4 -  Platelets 140 - 400 K/uL 226 191.0 -   CMP Latest Ref Rng & Units 01/05/2017 11/21/2016 12/07/2015  Glucose 70 - 99 mg/dL 108(H) 101(H) 104(H)  BUN 6 - 23 mg/dL 16 16 17   Creatinine 0.40 - 1.20 mg/dL 0.79 0.92 0.90  Sodium 135 - 145 mEq/L 141 140 140  Potassium 3.5 - 5.1 mEq/L 4.9 4.4 4.5  Chloride 96 - 112  mEq/L 105 106 105  CO2 19 - 32 mEq/L 26 20 30   Calcium 8.4 - 10.5 mg/dL 9.9 9.4 9.3  Total Protein 6.0 - 8.3 g/dL 6.9 6.9 -  Total Bilirubin 0.2 - 1.2 mg/dL 0.8 0.6 -  Alkaline Phos 39 - 117 U/L 68 78 -  AST 0 - 37 U/L 34 39(H) -  ALT 0 - 35 U/L 55(H) 59(H) -    Imaging: No results found.  Speciality Comments: No specialty comments available.    Procedures:  No procedures performed Allergies: Naproxen   Assessment / Plan:     Visit Diagnoses: Rheumatoid arthritis involving multiple sites with positive rheumatoid factor (HCC) - Positive RF, positive anti-CCP. Previous patient of Dr. Charlestine Night. Patient has no synovitis on examination today. She only has synovial thickening. Her discomfort mainly is coming from osteoarthritis.i plan to do ultrasound of her bilateral hands in January to look for any underlying synovitis.  High risk medication use - Humira 40 mg subcutaneous every 2 weeks, methotrexate 5 tablets by mouth every  week, folate acid 1 mg by mouth daily The frequency of Humira was increased to every 2 weeks due to increased pain. Her labs are stable except for elevated LFTs. We will continue to monitor those.  Primary osteoarthritis of both hands: Joint protection and muscle strengthening discussed.  History of kidney stones  History of hematuria  Other fatigue; Persist she  has very busy schedule.  Prediabetes  History of hypothyroidism  History of obesity : Weight loss diet and exercise was discussed.   Orders: No orders of the defined types were placed in this encounter.  Meds ordered this encounter  Medications  . Adalimumab (HUMIRA) 40 MG/0.4ML PSKT    Sig: Inject 40 mg every 14 (fourteen) days into the skin.    Dispense:  3 each    Refill:  0      Follow-Up Instructions: Return in about 5 months (around 07/14/2017) for Rheumatoid arthritis, Osteoarthritis.   Bo Merino, MD  Note - This record has been created using Editor, commissioning.  Chart  creation errors have been sought, but may not always  have been located. Such creation errors do not reflect on  the standard of medical care.

## 2017-02-13 ENCOUNTER — Ambulatory Visit (INDEPENDENT_AMBULATORY_CARE_PROVIDER_SITE_OTHER): Payer: BLUE CROSS/BLUE SHIELD | Admitting: Rheumatology

## 2017-02-13 ENCOUNTER — Encounter: Payer: Self-pay | Admitting: Rheumatology

## 2017-02-13 VITALS — BP 137/84 | HR 67 | Resp 16 | Ht 64.0 in | Wt 218.0 lb

## 2017-02-13 DIAGNOSIS — M19042 Primary osteoarthritis, left hand: Secondary | ICD-10-CM | POA: Diagnosis not present

## 2017-02-13 DIAGNOSIS — R7303 Prediabetes: Secondary | ICD-10-CM | POA: Diagnosis not present

## 2017-02-13 DIAGNOSIS — Z87448 Personal history of other diseases of urinary system: Secondary | ICD-10-CM | POA: Diagnosis not present

## 2017-02-13 DIAGNOSIS — Z79899 Other long term (current) drug therapy: Secondary | ICD-10-CM

## 2017-02-13 DIAGNOSIS — Z8639 Personal history of other endocrine, nutritional and metabolic disease: Secondary | ICD-10-CM

## 2017-02-13 DIAGNOSIS — Z87442 Personal history of urinary calculi: Secondary | ICD-10-CM | POA: Diagnosis not present

## 2017-02-13 DIAGNOSIS — M0579 Rheumatoid arthritis with rheumatoid factor of multiple sites without organ or systems involvement: Secondary | ICD-10-CM

## 2017-02-13 DIAGNOSIS — M19041 Primary osteoarthritis, right hand: Secondary | ICD-10-CM

## 2017-02-13 DIAGNOSIS — R5383 Other fatigue: Secondary | ICD-10-CM

## 2017-02-13 MED ORDER — ADALIMUMAB 40 MG/0.4ML ~~LOC~~ PSKT: 40.0000 mg | PREFILLED_SYRINGE | SUBCUTANEOUS | 0 refills | Status: DC

## 2017-02-13 NOTE — Patient Instructions (Signed)
Standing Labs We placed an order today for your standing lab work.    Please come back and get your standing labs in January and every 3 months  We have open lab Monday through Friday from 8:30-11:30 AM and 1:30-4 PM at the office of Dr. Legacy Lacivita.   The office is located at [address], [address], [place], [place] [address] No appointment is necessary.   Labs are drawn by [facility].  You may receive a bill from [facility] for your lab work. If you have any questions regarding directions or hours of operation,  please call [phone].    

## 2017-03-09 ENCOUNTER — Other Ambulatory Visit: Payer: Self-pay | Admitting: Rheumatology

## 2017-03-09 MED ORDER — ADALIMUMAB 40 MG/0.4ML ~~LOC~~ PSKT: 40.0000 mg | PREFILLED_SYRINGE | SUBCUTANEOUS | 0 refills | Status: DC

## 2017-03-09 NOTE — Telephone Encounter (Signed)
Last Visit: 02/13/17 Next Visit: 07/23/17 Labs: 11/21/16 Liver functions mildly elevated TB Gold: 11/21/16 Neg  Left message to advise patient she is due to update labs.   Okay to refill 30 day supply per Dr. Estanislado Pandy

## 2017-03-10 ENCOUNTER — Telehealth: Payer: Self-pay | Admitting: Rheumatology

## 2017-03-10 NOTE — Telephone Encounter (Signed)
Patient left a message, returning your call re lab results.

## 2017-03-11 NOTE — Telephone Encounter (Signed)
Patient states at her November appointment Dr. Estanislado Pandy advised her she could wait until January and have her labs to at the time of her ultrasound. Patient advised 30 day supply was sent in and should be enough until labs are done.

## 2017-03-11 NOTE — Telephone Encounter (Signed)
Attempted to contact the patient and left message for patient to call the office.  

## 2017-03-26 NOTE — Progress Notes (Deleted)
Rheumatoid arthritis involving multiple sites with positive rheumatoid factor (HCC) - Positive RF, positive anti-CCP. Previous patient of Dr. Charlestine Night. Patient has no synovitis on examination today. She only has synovial thickening. Her discomfort mainly is coming from osteoarthritis.i plan to do ultrasound of her bilateral hands in January to look for any underlying synovitis.

## 2017-04-01 ENCOUNTER — Inpatient Hospital Stay (INDEPENDENT_AMBULATORY_CARE_PROVIDER_SITE_OTHER): Payer: Self-pay

## 2017-04-01 ENCOUNTER — Ambulatory Visit (INDEPENDENT_AMBULATORY_CARE_PROVIDER_SITE_OTHER): Payer: BLUE CROSS/BLUE SHIELD | Admitting: Rheumatology

## 2017-04-01 DIAGNOSIS — M79641 Pain in right hand: Secondary | ICD-10-CM | POA: Diagnosis not present

## 2017-04-01 DIAGNOSIS — M79642 Pain in left hand: Secondary | ICD-10-CM | POA: Diagnosis not present

## 2017-04-01 DIAGNOSIS — Z79899 Other long term (current) drug therapy: Secondary | ICD-10-CM | POA: Diagnosis not present

## 2017-04-01 LAB — CBC WITH DIFFERENTIAL/PLATELET
BASOS ABS: 41 {cells}/uL (ref 0–200)
Basophils Relative: 0.8 %
EOS ABS: 112 {cells}/uL (ref 15–500)
Eosinophils Relative: 2.2 %
HCT: 43.1 % (ref 35.0–45.0)
HEMOGLOBIN: 14.7 g/dL (ref 11.7–15.5)
Lymphs Abs: 2203 cells/uL (ref 850–3900)
MCH: 33.7 pg — AB (ref 27.0–33.0)
MCHC: 34.1 g/dL (ref 32.0–36.0)
MCV: 98.9 fL (ref 80.0–100.0)
MONOS PCT: 10.4 %
MPV: 12.2 fL (ref 7.5–12.5)
Neutro Abs: 2213 cells/uL (ref 1500–7800)
Neutrophils Relative %: 43.4 %
PLATELETS: 196 10*3/uL (ref 140–400)
RBC: 4.36 10*6/uL (ref 3.80–5.10)
RDW: 13.7 % (ref 11.0–15.0)
TOTAL LYMPHOCYTE: 43.2 %
WBC mixed population: 530 cells/uL (ref 200–950)
WBC: 5.1 10*3/uL (ref 3.8–10.8)

## 2017-04-01 LAB — COMPLETE METABOLIC PANEL WITH GFR
AG RATIO: 2 (calc) (ref 1.0–2.5)
ALT: 36 U/L — ABNORMAL HIGH (ref 6–29)
AST: 21 U/L (ref 10–35)
Albumin: 4.5 g/dL (ref 3.6–5.1)
Alkaline phosphatase (APISO): 71 U/L (ref 33–130)
BUN: 17 mg/dL (ref 7–25)
CALCIUM: 9.4 mg/dL (ref 8.6–10.4)
CO2: 31 mmol/L (ref 20–32)
Chloride: 104 mmol/L (ref 98–110)
Creat: 0.78 mg/dL (ref 0.50–1.05)
GFR, EST AFRICAN AMERICAN: 99 mL/min/{1.73_m2} (ref >=60)
GFR, EST NON AFRICAN AMERICAN: 86 mL/min/{1.73_m2} (ref >=60)
GLOBULIN: 2.3 g/dL (ref 1.9–3.7)
Glucose, Bld: 106 mg/dL — ABNORMAL HIGH (ref 65–99)
POTASSIUM: 4.6 mmol/L (ref 3.5–5.3)
SODIUM: 142 mmol/L (ref 135–146)
TOTAL PROTEIN: 6.8 g/dL (ref 6.1–8.1)
Total Bilirubin: 0.5 mg/dL (ref 0.2–1.2)

## 2017-04-02 NOTE — Progress Notes (Signed)
LFTs are better 

## 2017-04-08 ENCOUNTER — Other Ambulatory Visit: Payer: Self-pay | Admitting: Rheumatology

## 2017-04-08 ENCOUNTER — Other Ambulatory Visit: Payer: BLUE CROSS/BLUE SHIELD | Admitting: Rheumatology

## 2017-04-08 NOTE — Telephone Encounter (Signed)
Last Visit: 02/13/17 Next Visit: 07/23/17 Labs: 04/01/17 LFTs improved TB Gold:  11/21/16 Neg   Okay to refill per Dr. Estanislado Pandy

## 2017-04-15 ENCOUNTER — Other Ambulatory Visit: Payer: Self-pay | Admitting: Rheumatology

## 2017-04-16 NOTE — Telephone Encounter (Signed)
Last Visit: 02/13/17 Next Visit: 07/23/17   Okay to refill per Dr. Estanislado Pandy

## 2017-04-21 DIAGNOSIS — H00031 Abscess of right upper eyelid: Secondary | ICD-10-CM | POA: Diagnosis not present

## 2017-04-23 DIAGNOSIS — H00031 Abscess of right upper eyelid: Secondary | ICD-10-CM | POA: Diagnosis not present

## 2017-05-05 ENCOUNTER — Other Ambulatory Visit: Payer: Self-pay | Admitting: Rheumatology

## 2017-05-05 MED ORDER — ADALIMUMAB 40 MG/0.8ML ~~LOC~~ AJKT: 40.0000 mg | AUTO-INJECTOR | SUBCUTANEOUS | 0 refills | Status: DC

## 2017-05-05 NOTE — Telephone Encounter (Signed)
Last Visit: 02/13/17 Next Visit: 07/23/17 Labs: 04/01/17 LFTs improved TB Gold:  11/21/16 Neg   Okay to refill per Dr. Estanislado Pandy

## 2017-05-20 ENCOUNTER — Other Ambulatory Visit: Payer: Self-pay | Admitting: Rheumatology

## 2017-05-20 MED ORDER — METHOTREXATE 2.5 MG PO TABS: 12.5000 mg | ORAL_TABLET | ORAL | 0 refills | Status: DC

## 2017-05-20 NOTE — Telephone Encounter (Signed)
Last Visit: 02/13/17 Next Visit: 07/23/17 Labs: 04/01/17 LFTs improved  Okay to refill per Dr. Estanislado Pandy

## 2017-05-22 ENCOUNTER — Telehealth: Payer: Self-pay | Admitting: Rheumatology

## 2017-05-22 MED ORDER — ADALIMUMAB 40 MG/0.8ML ~~LOC~~ AJKT: 40.0000 mg | AUTO-INJECTOR | SUBCUTANEOUS | 0 refills | Status: DC

## 2017-05-22 NOTE — Telephone Encounter (Signed)
Patient calling in reference to medication denial of Humira. Patient is out of medication, and would like to know why refill was denied. Please call to inform.

## 2017-05-22 NOTE — Telephone Encounter (Signed)
Last Visit: 02/13/17 Next Visit: 07/23/17 Labs: 04/01/17 LFTs improved TB Gold: 11/21/16 Neg   Patient states her pharmacy did not have the prescription on file that was sent in January 2019. Prescription was resent.

## 2017-07-09 NOTE — Progress Notes (Deleted)
Office Visit Note  Patient: Amy Hart             Date of Birth: 09-23-1960           MRN: [id number]             PCP: Jinny Sanders, MD Referring: Jinny Sanders, MD Visit Date: 07/23/2017 Occupation: @GUAROCC @    Subjective:  No chief complaint on file.   History of Present Illness: Amy Hart is a 57 y.o. female ***   Activities of Daily Living:  Patient reports morning stiffness for *** {minute/hour:19697}.   Patient {ACTIONS;DENIES/REPORTS:21021675::"Denies"} nocturnal pain.  Difficulty dressing/grooming: {ACTIONS;DENIES/REPORTS:21021675::"Denies"} Difficulty climbing stairs: {ACTIONS;DENIES/REPORTS:21021675::"Denies"} Difficulty getting out of chair: {ACTIONS;DENIES/REPORTS:21021675::"Denies"} Difficulty using hands for taps, buttons, cutlery, and/or writing: {ACTIONS;DENIES/REPORTS:21021675::"Denies"}   No Rheumatology ROS completed.   PMFS History:  Patient Active Problem List   Diagnosis Date Noted  . Prediabetes 01/08/2017  . Hyperkalemia 12/07/2015  . Obesity (BMI 35.0-39.9 without comorbidity) 12/07/2015  . Epistaxis, recurrent 12/05/2014  . Menopausal syndrome 12/05/2014  . Rheumatoid arthritis ([place]) 01/02/2012  . Pure hypercholesterolemia 08/29/2009  . 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 ROTATOR CUFF SYNDROME 08/04/2007  . Hypothyroidism 05/19/2007  . ALLERGIC RHINITIS, SEASONAL 05/19/2007    Past Medical History:  Diagnosis Date  . Allergic rhinitis due to pollen   . Collagen vascular disease ([place])   . Rheumatoid arthritis(714.0)   . Unspecified hypothyroidism     Family History  Problem Relation Age of Onset  . Hypertension Mother   . Thyroid disease Mother   . Nephrolithiasis Mother   . Diabetes Father   . Kidney Stones Sister    Past Surgical History:  Procedure Laterality Date  . BREAST BIOPSY  2003   right, negative  . ENDOMETRIAL ABLATION  2005  . FRACTURE SURGERY  01/01   right elbow  . ROTATOR CUFF REPAIR  06/2008   R. Cuff tear  . TAYLOR BUNIONECTOMY     right  . TREATMENT FISTULA ANAL  1993   repair   Social History   Social History Narrative   No exercise   Diet: Moderate   She has a Sales promotion account executive   Lives with her partner     Objective: Vital Signs: There were no vitals taken for this visit.   Physical Exam   Musculoskeletal Exam: ***  CDAI Exam: No CDAI exam completed.    Investigation: No additional findings.TB Gold: 11/21/2016 Negative  CBC Latest Ref Rng & Units 04/01/2017 11/21/2016 01/29/2015  WBC 3.8 - 10.8 Thousand/uL 5.1 4.2 5.4  Hemoglobin 11.7 - 15.5 g/dL 14.7 15.3 14.8  Hematocrit 35.0 - 45.0 % 43.1 45.4(H) 43.4  Platelets 140 - 400 Thousand/uL 196 226 191.0   CMP Latest Ref Rng & Units 04/01/2017 01/05/2017 11/21/2016  Glucose 65 - 99 mg/dL 106(H) 108(H) 101(H)  BUN 7 - 25 mg/dL 17 16 16   Creatinine 0.50 - 1.05 mg/dL 0.78 0.79 0.92  Sodium 135 - 146 mmol/L 142 141 140  Potassium 3.5 - 5.3 mmol/L 4.6 4.9 4.4  Chloride 98 - 110 mmol/L 104 105 106  CO2 20 - 32 mmol/L 31 26 20   Calcium 8.6 - 10.4 mg/dL 9.4 9.9 9.4  Total Protein 6.1 - 8.1 g/dL 6.8 6.9 6.9  Total Bilirubin 0.2 - 1.2 mg/dL 0.5 0.8 0.6  Alkaline Phos 39 - 117 U/L - 68 78  AST 10 - 35 U/L 21 34 39(H)  ALT 6 - 29 U/L 36(H) 55(H) 59(H)  Imaging: No results found.  Speciality Comments: No specialty comments available.    Procedures:  No procedures performed Allergies: Naproxen   Assessment / Plan:     Visit Diagnoses: No diagnosis found.    Orders: No orders of the defined types were placed in this encounter.  No orders of the defined types were placed in this encounter.   Face-to-face time spent with patient was *** minutes. 50% of time was spent in counseling and coordination of care.  Follow-Up Instructions: No follow-ups on file.   Earnestine Mealing, CMA  Note - This record has been created using Editor, commissioning.  Chart creation errors have been sought, but may not always   have been located. Such creation errors do not reflect on  the standard of medical care.

## 2017-07-20 NOTE — Progress Notes (Signed)
Office Visit Note  Patient: Amy Hart             Date of Birth: 1960/09/06           MRN: [id number]             PCP: Jinny Sanders, MD Referring: Jinny Sanders, MD Visit Date: 08/03/2017 Occupation: @GUAROCC @    Subjective:  Medication monitoring    History of Present Illness: Amy Hart is a 57 y.o. female with history of seropositive rheumatoid arthritis and osteoarthritis.  Patient continues to take methotrexate 5 tablets weekly, folic acid 1 mg daily, and injects Humira every 2 weeks subcutaneously.  She denies any recent flares.  She states that she continues to have discomfort in her bilateral fifth digits.  She states she also has stiffness in her bilateral hands.  She reports decreased grip strength bilaterally.  She denies any joint swelling at this time.  She denies any pain in her feet at this time.  She states that she has chronic pain in her lower back which is worse if she is lying flat or standing from prolonged periods of time.  She states that her bilateral knees ache at times.    Activities of Daily Living:  Patient reports morning stiffness for 15 minutes.   Patient Reports nocturnal pain.  Difficulty dressing/grooming: Denies Difficulty climbing stairs: Denies Difficulty getting out of chair: Denies Difficulty using hands for taps, buttons, cutlery, and/or writing: Denies   Review of Systems  Constitutional: Negative for fatigue.  HENT: Negative for mouth sores, trouble swallowing, trouble swallowing, mouth dryness and nose dryness.   Eyes: Positive for dryness. Negative for pain and visual disturbance.  Respiratory: Negative for cough, hemoptysis, shortness of breath and difficulty breathing.   Cardiovascular: Negative for chest pain, palpitations, hypertension and swelling in legs/feet.  Gastrointestinal: Negative for blood in stool, constipation and diarrhea.  Endocrine: Negative for increased urination.  Genitourinary: Negative for painful  urination.  Musculoskeletal: Positive for arthralgias, joint pain, joint swelling and morning stiffness. Negative for myalgias, muscle weakness, muscle tenderness and myalgias.  Skin: Negative for color change, pallor, rash, hair loss, nodules/bumps, skin tightness, ulcers and sensitivity to sunlight.  Allergic/Immunologic: Negative for susceptible to infections.  Neurological: Negative for dizziness, numbness, headaches and weakness.  Hematological: Negative for swollen glands.  Psychiatric/Behavioral: Negative for depressed mood and sleep disturbance. The patient is not nervous/anxious.     PMFS History:  Patient Active Problem List   Diagnosis Date Noted  . Prediabetes 01/08/2017  . Hyperkalemia 12/07/2015  . Obesity (BMI 35.0-39.9 without comorbidity) 12/07/2015  . Epistaxis, recurrent 12/05/2014  . Menopausal syndrome 12/05/2014  . Rheumatoid arthritis ([place]) 01/02/2012  . Pure hypercholesterolemia 08/29/2009  . 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 ROTATOR CUFF SYNDROME 08/04/2007  . Hypothyroidism 05/19/2007  . ALLERGIC RHINITIS, SEASONAL 05/19/2007    Past Medical History:  Diagnosis Date  . Allergic rhinitis due to pollen   . Collagen vascular disease ([place])   . Rheumatoid arthritis(714.0)   . Unspecified hypothyroidism     Family History  Problem Relation Age of Onset  . Hypertension Mother   . Thyroid disease Mother   . Nephrolithiasis Mother   . Diabetes Father   . Kidney Stones Sister    Past Surgical History:  Procedure Laterality Date  . BREAST BIOPSY  2003   right, negative  . ENDOMETRIAL ABLATION  2005  . FRACTURE SURGERY  01/01   right elbow  . ROTATOR CUFF  REPAIR  06/2008   R. Cuff tear  . TAYLOR BUNIONECTOMY     right  . TREATMENT FISTULA ANAL  1993   repair   Social History   Social History Narrative   No exercise   Diet: Moderate   She has a Sales promotion account executive   Lives with her partner     Objective: Vital Signs: BP 125/80 (BP Location:  Left Arm, Patient Position: Sitting, Cuff Size: Normal)   Pulse 63   Ht 5\' 4"  (1.626 m)   Wt 223 lb (101.2 kg)   BMI 38.28 kg/m    Physical Exam  Constitutional: She is oriented to person, place, and time. She appears well-developed and well-nourished.  HENT:  Head: Normocephalic and atraumatic.  Eyes: Conjunctivae and EOM are normal.  Neck: Normal range of motion.  Cardiovascular: Normal rate, regular rhythm, normal heart sounds and intact distal pulses.  Pulmonary/Chest: Effort normal and breath sounds normal.  Abdominal: Soft. Bowel sounds are normal.  Lymphadenopathy:    She has no cervical adenopathy.  Neurological: She is alert and oriented to person, place, and time.  Skin: Skin is warm and dry. Capillary refill takes less than 2 seconds.  Psychiatric: She has a normal mood and affect. Her behavior is normal.  Nursing note and vitals reviewed.    Musculoskeletal Exam: C-spine, thoracic spine, lumbar spine good range of motion.  Shoulder joints, left elbow joint, wrist joints, MCPs, PIPs, DIPs good range of motion with no synovitis.  Right elbow contracture due to previous trauma.  She has flexion contractures of her fifth PIP joints bilaterally.  She has synovial thickening of her second MCP joints bilaterally.  She has no synovitis of MCP joints.  She has PIP and DIP synovial thickening consistent with osteoarthritis.  Hip joints, knee joints, ankle joints, MTPs, PIPs, DIPs good range of motion no synovitis.  No warmth or effusion of bilateral knees.  No tenderness of trochanteric bursa.  CDAI Exam: CDAI Homunculus Exam:   Joint Counts:  CDAI Tender Joint count: 0 CDAI Swollen Joint count: 0  Global Assessments:  Patient Global Assessment: 5 Provider Global Assessment: 5  CDAI Calculated Score: 10    Investigation: No additional findings. CBC Latest Ref Rng & Units 04/01/2017 11/21/2016 01/29/2015  WBC 3.8 - 10.8 Thousand/uL 5.1 4.2 5.4  Hemoglobin 11.7 - 15.5 g/dL  14.7 15.3 14.8  Hematocrit 35.0 - 45.0 % 43.1 45.4(H) 43.4  Platelets 140 - 400 Thousand/uL 196 226 191.0   CMP Latest Ref Rng & Units 04/01/2017 01/05/2017 11/21/2016  Glucose 65 - 99 mg/dL 106(H) 108(H) 101(H)  BUN 7 - 25 mg/dL 17 16 16   Creatinine 0.50 - 1.05 mg/dL 0.78 0.79 0.92  Sodium 135 - 146 mmol/L 142 141 140  Potassium 3.5 - 5.3 mmol/L 4.6 4.9 4.4  Chloride 98 - 110 mmol/L 104 105 106  CO2 20 - 32 mmol/L 31 26 20   Calcium 8.6 - 10.4 mg/dL 9.4 9.9 9.4  Total Protein 6.1 - 8.1 g/dL 6.8 6.9 6.9  Total Bilirubin 0.2 - 1.2 mg/dL 0.5 0.8 0.6  Alkaline Phos 39 - 117 U/L - 68 78  AST 10 - 35 U/L 21 34 39(H)  ALT 6 - 29 U/L 36(H) 55(H) 59(H)    Imaging: No results found.  Speciality Comments: No specialty comments available.    Procedures:  No procedures performed Allergies: Naproxen   Assessment / Plan:     Visit Diagnoses: Rheumatoid arthritis involving multiple sites with positive rheumatoid  factor ([place]): She has no synovitis on exam.  She has not had any recent flares.  She does not have any joint pain or joint swelling at this time.  She will continue on Humira subcutaneous injections every 2 weeks and methotrexate 5 tablets weekly.  Refills of methotrexate and Humira were sent to the pharmacy.  High risk medication use -  Humira 40 mg subcutaneous every 2 weeks, methotrexate 5 tablets by mouth every week, folate acid 1 mg by mouth dailyTB gold 11/21/16.  CBC and CMP will be drawn today to monitor for drug toxicity.  Sending orders are in place.  She will return in July and every 3 months for lab work.- Plan: CBC with Differential/Platelet, COMPLETE METABOLIC PANEL WITH GFR, COMPLETE METABOLIC PANEL WITH GFR, CBC with Differential/Platelet  Primary osteoarthritis of both hands: She has PIP and DIP synovial thickening consistent with osteoarthritis of bilateral hands.  Joint protection muscle strengthening were discussed.  Other medical conditions are listed as  follows:  History of kidney stones  History of hematuria  Other fatigue  Prediabetes  History of hypothyroidism  History of obesity    Orders: Orders Placed This Encounter  Procedures  . CBC with Differential/Platelet  . COMPLETE METABOLIC PANEL WITH GFR   Meds ordered this encounter  Medications  . Adalimumab (HUMIRA PEN) 40 MG/0.4ML PNKT    Sig: Inject 40 mg into the skin every 14 (fourteen) days.    Dispense:  6 each    Refill:  0  . methotrexate (RHEUMATREX) 2.5 MG tablet    Sig: Take 5 tablets (12.5 mg total) by mouth once a week. Caution:Chemotherapy. Protect from light.    Dispense:  60 tablet    Refill:  0    Follow-Up Instructions: Return in about 5 months (around 01/03/2018) for Rheumatoid arthritis, Osteoarthritis.   Ofilia Neas, PA-C  Note - This record has been created using Dragon software.  Chart creation errors have been sought, but may not always  have been located. Such creation errors do not reflect on  the standard of medical care.

## 2017-07-23 ENCOUNTER — Ambulatory Visit: Payer: BLUE CROSS/BLUE SHIELD | Admitting: Rheumatology

## 2017-07-27 DIAGNOSIS — H524 Presbyopia: Secondary | ICD-10-CM | POA: Diagnosis not present

## 2017-08-03 ENCOUNTER — Ambulatory Visit (INDEPENDENT_AMBULATORY_CARE_PROVIDER_SITE_OTHER): Payer: BLUE CROSS/BLUE SHIELD | Admitting: Physician Assistant

## 2017-08-03 ENCOUNTER — Encounter: Payer: Self-pay | Admitting: Physician Assistant

## 2017-08-03 ENCOUNTER — Other Ambulatory Visit: Payer: Self-pay | Admitting: Rheumatology

## 2017-08-03 VITALS — BP 125/80 | HR 63 | Ht 64.0 in | Wt 223.0 lb

## 2017-08-03 DIAGNOSIS — Z79899 Other long term (current) drug therapy: Secondary | ICD-10-CM

## 2017-08-03 DIAGNOSIS — M0579 Rheumatoid arthritis with rheumatoid factor of multiple sites without organ or systems involvement: Secondary | ICD-10-CM

## 2017-08-03 DIAGNOSIS — Z87442 Personal history of urinary calculi: Secondary | ICD-10-CM | POA: Diagnosis not present

## 2017-08-03 DIAGNOSIS — Z8639 Personal history of other endocrine, nutritional and metabolic disease: Secondary | ICD-10-CM

## 2017-08-03 DIAGNOSIS — M19041 Primary osteoarthritis, right hand: Secondary | ICD-10-CM

## 2017-08-03 DIAGNOSIS — Z87448 Personal history of other diseases of urinary system: Secondary | ICD-10-CM | POA: Diagnosis not present

## 2017-08-03 DIAGNOSIS — M19042 Primary osteoarthritis, left hand: Secondary | ICD-10-CM | POA: Diagnosis not present

## 2017-08-03 DIAGNOSIS — R5383 Other fatigue: Secondary | ICD-10-CM

## 2017-08-03 DIAGNOSIS — R7303 Prediabetes: Secondary | ICD-10-CM

## 2017-08-03 LAB — CBC WITH DIFFERENTIAL/PLATELET
BASOS ABS: 31 {cells}/uL (ref 0–200)
Basophils Relative: 0.6 %
Eosinophils Absolute: 120 cells/uL (ref 15–500)
Eosinophils Relative: 2.3 %
HCT: 41.3 % (ref 35.0–45.0)
Hemoglobin: 14.4 g/dL (ref 11.7–15.5)
Lymphs Abs: 2215 cells/uL (ref 850–3900)
MCH: 33.8 pg — AB (ref 27.0–33.0)
MCHC: 34.9 g/dL (ref 32.0–36.0)
MCV: 96.9 fL (ref 80.0–100.0)
MPV: 11.8 fL (ref 7.5–12.5)
Monocytes Relative: 8.4 %
NEUTROS PCT: 46.1 %
Neutro Abs: 2397 cells/uL (ref 1500–7800)
PLATELETS: 216 10*3/uL (ref 140–400)
RBC: 4.26 10*6/uL (ref 3.80–5.10)
RDW: 13.4 % (ref 11.0–15.0)
TOTAL LYMPHOCYTE: 42.6 %
WBC: 5.2 10*3/uL (ref 3.8–10.8)
WBCMIX: 437 {cells}/uL (ref 200–950)

## 2017-08-03 LAB — COMPLETE METABOLIC PANEL WITH GFR
AG Ratio: 1.8 (calc) (ref 1.0–2.5)
ALKALINE PHOSPHATASE (APISO): 68 U/L (ref 33–130)
ALT: 27 U/L (ref 6–29)
AST: 23 U/L (ref 10–35)
Albumin: 4.5 g/dL (ref 3.6–5.1)
BUN: 12 mg/dL (ref 7–25)
CO2: 30 mmol/L (ref 20–32)
CREATININE: 0.76 mg/dL (ref 0.50–1.05)
Calcium: 9.7 mg/dL (ref 8.6–10.4)
Chloride: 105 mmol/L (ref 98–110)
GFR, Est African American: 102 mL/min/{1.73_m2} (ref >=60)
GFR, Est Non African American: 88 mL/min/{1.73_m2} (ref >=60)
GLUCOSE: 96 mg/dL (ref 65–99)
Globulin: 2.5 g/dL (calc) (ref 1.9–3.7)
Potassium: 5.4 mmol/L — ABNORMAL HIGH (ref 3.5–5.3)
Sodium: 142 mmol/L (ref 135–146)
Total Bilirubin: 0.4 mg/dL (ref 0.2–1.2)
Total Protein: 7 g/dL (ref 6.1–8.1)

## 2017-08-03 MED ORDER — ADALIMUMAB 40 MG/0.4ML ~~LOC~~ AJKT: 40.0000 mg | AUTO-INJECTOR | SUBCUTANEOUS | 0 refills | Status: DC

## 2017-08-03 MED ORDER — METHOTREXATE 2.5 MG PO TABS: 12.5000 mg | ORAL_TABLET | ORAL | 0 refills | Status: DC

## 2017-08-03 NOTE — Patient Instructions (Signed)
Standing Labs We placed an order today for your standing lab work.    Please come back and get your standing labs in July and every 3 months   We have open lab Monday through Friday from 8:30-11:30 AM and 1:30-4:00 PM  at the office of Dr. Shaili Deveshwar.   You may experience shorter wait times on Monday and Friday afternoons. The office is located at [address], [address], [place], [place] [address] No appointment is necessary.   Labs are drawn by [facility].  You may receive a bill from [facility] for your lab work. If you have any questions regarding directions or hours of operation,  please call [phone].    

## 2017-08-04 NOTE — Progress Notes (Signed)
Potassium is mildly elevated. Please notify patient and fax results to PCP.  MCH stable. All other lab values are WNL.

## 2017-08-07 ENCOUNTER — Encounter: Payer: Self-pay | Admitting: Rheumatology

## 2017-10-02 ENCOUNTER — Other Ambulatory Visit: Payer: Self-pay | Admitting: Family Medicine

## 2017-10-13 ENCOUNTER — Other Ambulatory Visit: Payer: Self-pay | Admitting: Physician Assistant

## 2017-10-13 NOTE — Telephone Encounter (Signed)
Last Visit: 08/03/17 Next Visit: 01/05/18 Labs: 08/03/17 Potassium is mildly elevated. MCH stable. All other lab values are WNL.  TB Gold: 11/21/16 Neg   Okay to refill per Dr. Estanislado Pandy

## 2017-10-16 ENCOUNTER — Other Ambulatory Visit: Payer: Self-pay

## 2017-10-16 DIAGNOSIS — Z79899 Other long term (current) drug therapy: Secondary | ICD-10-CM

## 2017-10-16 LAB — CBC WITH DIFFERENTIAL/PLATELET
BASOS PCT: 0.7 %
Basophils Absolute: 32 cells/uL (ref 0–200)
EOS ABS: 78 {cells}/uL (ref 15–500)
Eosinophils Relative: 1.7 %
HEMATOCRIT: 42.3 % (ref 35.0–45.0)
HEMOGLOBIN: 14.4 g/dL (ref 11.7–15.5)
LYMPHS ABS: 1904 {cells}/uL (ref 850–3900)
MCH: 33.6 pg — AB (ref 27.0–33.0)
MCHC: 34 g/dL (ref 32.0–36.0)
MCV: 98.8 fL (ref 80.0–100.0)
MPV: 12.1 fL (ref 7.5–12.5)
Monocytes Relative: 7.8 %
NEUTROS ABS: 2226 {cells}/uL (ref 1500–7800)
Neutrophils Relative %: 48.4 %
Platelets: 180 10*3/uL (ref 140–400)
RBC: 4.28 10*6/uL (ref 3.80–5.10)
RDW: 13.4 % (ref 11.0–15.0)
Total Lymphocyte: 41.4 %
WBC: 4.6 10*3/uL (ref 3.8–10.8)
WBCMIX: 359 {cells}/uL (ref 200–950)

## 2017-10-16 LAB — COMPLETE METABOLIC PANEL WITH GFR
AG Ratio: 1.8 (calc) (ref 1.0–2.5)
ALBUMIN MSPROF: 4.4 g/dL (ref 3.6–5.1)
ALT: 37 U/L — ABNORMAL HIGH (ref 6–29)
AST: 32 U/L (ref 10–35)
Alkaline phosphatase (APISO): 72 U/L (ref 33–130)
BUN: 13 mg/dL (ref 7–25)
CALCIUM: 9.5 mg/dL (ref 8.6–10.4)
CO2: 29 mmol/L (ref 20–32)
CREATININE: 0.77 mg/dL (ref 0.50–1.05)
Chloride: 105 mmol/L (ref 98–110)
GFR, EST AFRICAN AMERICAN: 100 mL/min/{1.73_m2} (ref >=60)
GFR, EST NON AFRICAN AMERICAN: 86 mL/min/{1.73_m2} (ref >=60)
GLOBULIN: 2.4 g/dL (ref 1.9–3.7)
Glucose, Bld: 94 mg/dL (ref 65–99)
Potassium: 4.6 mmol/L (ref 3.5–5.3)
SODIUM: 140 mmol/L (ref 135–146)
Total Bilirubin: 0.6 mg/dL (ref 0.2–1.2)
Total Protein: 6.8 g/dL (ref 6.1–8.1)

## 2017-10-19 NOTE — Progress Notes (Signed)
ALT mildly elevated.  Please advise patient to avoid tylenol, NSAIDs, and alcohol.  She is currently taking MTX 5 tablets by mouth once weekly.  She was doing well at her last visit, so Dr. Estanislado Pandy suggested to decrease MTX to 4 tablets by mouth once weekly.  All other labs are WNL.

## 2017-10-27 ENCOUNTER — Other Ambulatory Visit: Payer: Self-pay | Admitting: Physician Assistant

## 2017-10-27 NOTE — Telephone Encounter (Signed)
Last Visit: 08/03/17 Next Visit: 01/05/18 Labs: 10/16/17 ALT mildly elevated. Please advise patient to avoid tylenol, NSAIDs, and alcohol. decrease MTX to 4 tablets by mouth once weekly. All other labs are WNL.   Okay to refill per Dr. Estanislado Pandy

## 2017-12-21 ENCOUNTER — Other Ambulatory Visit: Payer: Self-pay | Admitting: Rheumatology

## 2017-12-21 NOTE — Telephone Encounter (Signed)
Last Visit: 08/03/17 Next Visit: 01/05/18 Labs: 10/16/17 ALT mildly elevated. Please advise patient to avoid tylenol, NSAIDs, and alcohol. decrease MTX to 4 tablets by mouth once weekly. All other labs are WNL.  TB Gold: 11/21/16 Neg   Okay to refill per Dr. Estanislado Pandy

## 2017-12-22 NOTE — Progress Notes (Signed)
Office Visit Note  Patient: Amy Hart             Date of Birth: [birth year]           MRN: [id number]             PCP: Jinny Sanders, MD Referring: Jinny Sanders, MD Visit Date: 01/05/2018 Occupation: @GUAROCC @  Subjective:  Pain in multiple joints   History of Present Illness: Amy Hart is a 57 y.o. female with history of seropositive rheumatoid arthritis and osteoarthritis.  Patient is on Humira 40 mg subcutaneous injections every 2 weeks, methotrexate 4 tablets by mouth once weekly and folic acid 1 mg by mouth daily.  She denies missing any doses of her medications recently.  She continues to take turmeric daily.  She is previously taking methotrexate 5 tablets by mouth once weekly but decreased to 4 tablets by mouth once weekly due to elevated ALT.  She has been having increased discomfort in multiple joints including bilateral hands, bilateral shoulders, bilateral knee joints, bilateral ankle joints, bilateral feet.  She states that she has noticed swelling in her hands especially in bilateral fifth PIP joints.  She states that she occasionally wakes up at night due to pain in both hands.  She describes the pain as a throbbing pain.  She states that her right fourth and fifth digits become numb at night.  She states in the past she has previously injured her right elbow joint and sustained ulnar nerve damage at that time. Increased joint pain in both hands shoulders knees ankles and feet    Activities of Daily Living:  Patient reports morning stiffness for 10 minutes.   Patient Reports nocturnal pain.  Difficulty dressing/grooming: Denies Difficulty climbing stairs: Reports Difficulty getting out of chair: Reports Difficulty using hands for taps, buttons, cutlery, and/or writing: Reports  Review of Systems  Constitutional: Positive for fatigue.  HENT: Negative for mouth sores, mouth dryness and nose dryness.   Eyes: Negative for pain, visual disturbance and dryness.    Respiratory: Negative for cough, hemoptysis, shortness of breath and difficulty breathing.   Cardiovascular: Negative for chest pain, palpitations, hypertension and swelling in legs/feet.  Gastrointestinal: Negative for blood in stool, constipation and diarrhea.  Endocrine: Negative for increased urination.  Genitourinary: Negative for painful urination.  Musculoskeletal: Positive for arthralgias, joint pain, joint swelling and morning stiffness. Negative for myalgias, muscle weakness, muscle tenderness and myalgias.  Skin: Negative for color change, pallor, rash, hair loss, nodules/bumps, skin tightness, ulcers and sensitivity to sunlight.  Allergic/Immunologic: Negative for susceptible to infections.  Neurological: Negative for dizziness, numbness, headaches and weakness.  Hematological: Negative for swollen glands.  Psychiatric/Behavioral: Negative for depressed mood and sleep disturbance. The patient is not nervous/anxious.     PMFS History:  Patient Active Problem List   Diagnosis Date Noted  . Prediabetes 01/08/2017  . Hyperkalemia 12/07/2015  . Obesity (BMI 35.0-39.9 without comorbidity) 12/07/2015  . Epistaxis, recurrent 12/05/2014  . Menopausal syndrome 12/05/2014  . Rheumatoid arthritis ([place]) 01/02/2012  . Pure hypercholesterolemia 08/29/2009  . 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 ROTATOR CUFF SYNDROME 08/04/2007  . Hypothyroidism 05/19/2007  . ALLERGIC RHINITIS, SEASONAL 05/19/2007    Past Medical History:  Diagnosis Date  . Allergic rhinitis due to pollen   . Collagen vascular disease ([place])   . Rheumatoid arthritis(714.0)   . Unspecified hypothyroidism     Family History  Problem Relation Age of Onset  . Hypertension Mother   . Thyroid  disease Mother   . Nephrolithiasis Mother   . Diabetes Father   . Kidney Stones Sister    Past Surgical History:  Procedure Laterality Date  . BREAST BIOPSY  2003   right, negative  . ENDOMETRIAL ABLATION  2005  . FRACTURE  SURGERY  01/01   right elbow  . ROTATOR CUFF REPAIR  06/2008   R. Cuff tear  . Amiley Shishido BUNIONECTOMY     right  . TREATMENT FISTULA ANAL  1993   repair   Social History   Social History Narrative   No exercise   Diet: Moderate   She has a Sales promotion account executive   Lives with her partner    Objective: Vital Signs: BP 121/76 (BP Location: Left Arm, Patient Position: Sitting, Cuff Size: Large)   Pulse 64   Resp 15   Ht 5\' 4"  (1.626 m)   Wt 226 lb 9.6 oz (102.8 kg)   BMI 38.90 kg/m    Physical Exam  Constitutional: She is oriented to person, place, and time. She appears well-developed and well-nourished.  HENT:  Head: Normocephalic and atraumatic.  Eyes: Conjunctivae and EOM are normal.  Neck: Normal range of motion.  Cardiovascular: Normal rate, regular rhythm, normal heart sounds and intact distal pulses.  Pulmonary/Chest: Effort normal and breath sounds normal.  Abdominal: Soft. Bowel sounds are normal.  Lymphadenopathy:    She has no cervical adenopathy.  Neurological: She is alert and oriented to person, place, and time.  Skin: Skin is warm and dry. Capillary refill takes less than 2 seconds.  Psychiatric: She has a normal mood and affect. Her behavior is normal.  Nursing note and vitals reviewed.    Musculoskeletal Exam: C-spine, thoracic spine, and lumbar spine good ROM.  No midline spinal tenderness. Shoulder joints full ROM with discomfort.  Wrist joints good ROM with no synovitis. Bilateral 5th PIP joint flexion contractures.  Synovitis as described below.  Right elbow chronic contracture from previous injured.  Hip joints, knee joints, ankle joints, MTPs, PIPs, and DIPs good ROM with no synovitis.  No warmth or effusion of knee joints.  No tenderness or swelling of ankle joints.   CDAI Exam: CDAI Score: 13  Patient Global Assessment: 5 (mm); Provider Global Assessment: 5 (mm) Swollen: 2 ; Tender: 10  Joint Exam      Right  Left  MCP 1   Tender  Swollen Tender    PIP 2   Tender   Tender  PIP 3   Tender   Tender  PIP 4   Tender   Tender  PIP 5   Tender  Swollen Tender     Investigation: No additional findings.  Imaging: No results found.  Recent Labs: Lab Results  Component Value Date   WBC 4.6 10/16/2017   HGB 14.4 10/16/2017   PLT 180 10/16/2017   NA 140 10/16/2017   K 4.6 10/16/2017   CL 105 10/16/2017   CO2 29 10/16/2017   GLUCOSE 94 10/16/2017   BUN 13 10/16/2017   CREATININE 0.77 10/16/2017   BILITOT 0.6 10/16/2017   ALKPHOS 68 01/05/2017   AST 32 10/16/2017   ALT 37 (H) 10/16/2017   PROT 6.8 10/16/2017   ALBUMIN 4.3 01/05/2017   CALCIUM 9.5 10/16/2017   GFRAA 100 10/16/2017    Speciality Comments: No specialty comments available.  Procedures:  No procedures performed Allergies: Naproxen   Assessment / Plan:     Visit Diagnoses: Rheumatoid arthritis involving multiple sites with positive rheumatoid  factor ([place]): She has synovitis of the left first MCP joint and left fifth PIP joint.  She has tenderness of all PIP joints.  She was previously taking methotrexate 5 tablets by mouth once weekly but decreased to 4 tablets weekly due to elevated ALT.  Her LFTs have been chronically elevated but stable.  She developed increased discomfort in multiple joints after lowering the dose of methotrexate.  We discussed increasing her dose of methotrexate back up to 5 tablets by mouth once weekly as well as giving her a prednisone taper starting at 20 mg and tapering by 5 mg every 2 days.  She will continue taking folic acid 1 mg by mouth daily.  We will continue to monitor lab work every 3 months.  Her LFTs have been stable in the past.  She was advised to notify us if she develops any increased joint pain or joint swelling.  She will follow-up in the office in 5 months.  A prednisone taper was sent to the pharmacy today.  High risk medication use - Humira 40 mg subcutaneous every 2 weeks, methotrexate 5 tablets by mouth every week, folic  acid 1 mg po daily.  CBC and CMP were drawn today to monitor for drug toxicity.  She will return in January every 3 months for lab work.  TB gold was also drawn today.- Plan: CBC with Differential/Platelet, COMPLETE METABOLIC PANEL WITH GFR, QuantiFERON-TB Gold Plus  Primary osteoarthritis of both hands: She has PIP and DIP synovial thickening consistent with osteoarthritis of bilateral hands.  She is complete fist formation bilaterally.  She has tenderness of all PIP joints.  Joint protection and muscle strengthening were discussed.   Other fatigue: Chronic  Other medical conditions are listed as follows:  History of hypothyroidism  Prediabetes  History of kidney stones  History of hematuria   Orders: Orders Placed This Encounter  Procedures  . CBC with Differential/Platelet  . COMPLETE METABOLIC PANEL WITH GFR  . QuantiFERON-TB Gold Plus   Meds ordered this encounter  Medications  . folic acid (FOLVITE) 1 MG tablet    Sig: Take 1 tablet (1 mg total) by mouth daily.    Dispense:  90 tablet    Refill:  3  . predniSONE (DELTASONE) 5 MG tablet    Sig: Take 4 tablets by mouth daily for 2 days, 3 tablets po x2 days, 2 tablets po x2 days, 1 tablet po x2 days.    Dispense:  20 tablet    Refill:  0      Follow-Up Instructions: Return in about 5 months (around 06/06/2018) for Rheumatoid arthritis, Osteoarthritis.   Ofilia Neas, PA-C   I examined and evaluated the patient with Amy Sams PA.  Patient was experiencing a flare on decreased dose of methotrexate.  No synovitis on my examination as described above.  I detailed discussion with the patient regarding methotrexate.  She has been running mildly elevated LFTs on methotrexate.  She has tolerated the combination of methotrexate 5 tablets along with Humira every other week quite well in the past.  We will increase her methotrexate back to 5 tablets p.o. weekly.  We have also given her a prescription for prednisone with tapering  schedule as described above.  The plan of care was discussed as noted above.  Bo Merino, MD  Note - This record has been created using Editor, commissioning.  Chart creation errors have been sought, but may not always  have been located. Such creation errors do  not reflect on  the standard of medical care.

## 2017-12-23 ENCOUNTER — Other Ambulatory Visit: Payer: Self-pay | Admitting: Rheumatology

## 2018-01-05 ENCOUNTER — Ambulatory Visit (INDEPENDENT_AMBULATORY_CARE_PROVIDER_SITE_OTHER): Payer: BLUE CROSS/BLUE SHIELD | Admitting: Rheumatology

## 2018-01-05 ENCOUNTER — Encounter: Payer: Self-pay | Admitting: Physician Assistant

## 2018-01-05 VITALS — BP 121/76 | HR 64 | Resp 15 | Ht 64.0 in | Wt 226.6 lb

## 2018-01-05 DIAGNOSIS — M0579 Rheumatoid arthritis with rheumatoid factor of multiple sites without organ or systems involvement: Secondary | ICD-10-CM

## 2018-01-05 DIAGNOSIS — Z79899 Other long term (current) drug therapy: Secondary | ICD-10-CM | POA: Diagnosis not present

## 2018-01-05 DIAGNOSIS — Z87442 Personal history of urinary calculi: Secondary | ICD-10-CM

## 2018-01-05 DIAGNOSIS — R5383 Other fatigue: Secondary | ICD-10-CM | POA: Diagnosis not present

## 2018-01-05 DIAGNOSIS — Z8639 Personal history of other endocrine, nutritional and metabolic disease: Secondary | ICD-10-CM

## 2018-01-05 DIAGNOSIS — Z87448 Personal history of other diseases of urinary system: Secondary | ICD-10-CM

## 2018-01-05 DIAGNOSIS — Z9225 Personal history of immunosupression therapy: Secondary | ICD-10-CM | POA: Diagnosis not present

## 2018-01-05 DIAGNOSIS — R7303 Prediabetes: Secondary | ICD-10-CM

## 2018-01-05 DIAGNOSIS — M19041 Primary osteoarthritis, right hand: Secondary | ICD-10-CM | POA: Diagnosis not present

## 2018-01-05 DIAGNOSIS — M19042 Primary osteoarthritis, left hand: Secondary | ICD-10-CM

## 2018-01-05 MED ORDER — PREDNISONE 5 MG PO TABS: ORAL_TABLET | ORAL | 0 refills | Status: DC

## 2018-01-05 MED ORDER — FOLIC ACID 1 MG PO TABS: 1.0000 mg | ORAL_TABLET | Freq: Every day | ORAL | 3 refills | Status: DC

## 2018-01-05 NOTE — Patient Instructions (Addendum)
Standing Labs We placed an order today for your standing lab work.    Please come back and get your standing labs in January and every 3 months   We have open lab Monday through Friday from 8:30-11:30 AM and 1:30-4:00 PM  at the office of Dr. Shaili Deveshwar.   You may experience shorter wait times on Monday and Friday afternoons. The office is located at [address], [address], [place], [place] [address] No appointment is necessary.   Labs are drawn by [facility].  You may receive a bill from [facility] for your lab work. If you have any questions regarding directions or hours of operation,  please call [phone].   Just as a reminder please drink plenty of water prior to coming for your lab work. Thanks!   

## 2018-01-06 ENCOUNTER — Encounter: Payer: Self-pay | Admitting: Rheumatology

## 2018-01-06 NOTE — Progress Notes (Signed)
Labs are WNL.

## 2018-01-07 LAB — COMPLETE METABOLIC PANEL WITH GFR
AG RATIO: 1.9 (calc) (ref 1.0–2.5)
ALT: 19 U/L (ref 6–29)
AST: 19 U/L (ref 10–35)
Albumin: 4.4 g/dL (ref 3.6–5.1)
Alkaline phosphatase (APISO): 76 U/L (ref 33–130)
BILIRUBIN TOTAL: 0.6 mg/dL (ref 0.2–1.2)
BUN: 16 mg/dL (ref 7–25)
CALCIUM: 9.5 mg/dL (ref 8.6–10.4)
CHLORIDE: 105 mmol/L (ref 98–110)
CO2: 26 mmol/L (ref 20–32)
Creat: 0.75 mg/dL (ref 0.50–1.05)
GFR, EST AFRICAN AMERICAN: 103 mL/min/{1.73_m2} (ref >=60)
GFR, EST NON AFRICAN AMERICAN: 89 mL/min/{1.73_m2} (ref >=60)
GLOBULIN: 2.3 g/dL (ref 1.9–3.7)
Glucose, Bld: 100 mg/dL — ABNORMAL HIGH (ref 65–99)
POTASSIUM: 4.8 mmol/L (ref 3.5–5.3)
SODIUM: 139 mmol/L (ref 135–146)
Total Protein: 6.7 g/dL (ref 6.1–8.1)

## 2018-01-07 LAB — CBC WITH DIFFERENTIAL/PLATELET
Basophils Absolute: 41 cells/uL (ref 0–200)
Basophils Relative: 0.8 %
EOS ABS: 61 {cells}/uL (ref 15–500)
Eosinophils Relative: 1.2 %
HCT: 43.4 % (ref 35.0–45.0)
Hemoglobin: 14.8 g/dL (ref 11.7–15.5)
Lymphs Abs: 2122 cells/uL (ref 850–3900)
MCH: 32.6 pg (ref 27.0–33.0)
MCHC: 34.1 g/dL (ref 32.0–36.0)
MCV: 95.6 fL (ref 80.0–100.0)
MONOS PCT: 9.1 %
MPV: 12.3 fL (ref 7.5–12.5)
NEUTROS ABS: 2412 {cells}/uL (ref 1500–7800)
Neutrophils Relative %: 47.3 %
PLATELETS: 197 10*3/uL (ref 140–400)
RBC: 4.54 10*6/uL (ref 3.80–5.10)
RDW: 12.9 % (ref 11.0–15.0)
TOTAL LYMPHOCYTE: 41.6 %
WBC mixed population: 464 cells/uL (ref 200–950)
WBC: 5.1 10*3/uL (ref 3.8–10.8)

## 2018-01-07 LAB — QUANTIFERON-TB GOLD PLUS
Mitogen-NIL: >10 IU/mL
NIL: 0.02 IU/mL
QuantiFERON-TB Gold Plus: NEGATIVE
TB1-NIL: 0 [IU]/mL
TB2-NIL: 0 [IU]/mL

## 2018-01-07 NOTE — Progress Notes (Signed)
TB gold negative

## 2018-01-21 ENCOUNTER — Other Ambulatory Visit: Payer: Self-pay | Admitting: Rheumatology

## 2018-01-21 NOTE — Telephone Encounter (Signed)
Last visit: 01/05/18 Next Visit: 06/10/18 Labs: 01/05/18 WNL  Okay to refill per Dr. Estanislado Pandy

## 2018-02-15 ENCOUNTER — Telehealth: Payer: Self-pay | Admitting: Family Medicine

## 2018-02-15 ENCOUNTER — Other Ambulatory Visit (INDEPENDENT_AMBULATORY_CARE_PROVIDER_SITE_OTHER): Payer: BLUE CROSS/BLUE SHIELD

## 2018-02-15 DIAGNOSIS — E7800 Pure hypercholesterolemia, unspecified: Secondary | ICD-10-CM | POA: Diagnosis not present

## 2018-02-15 DIAGNOSIS — E039 Hypothyroidism, unspecified: Secondary | ICD-10-CM

## 2018-02-15 DIAGNOSIS — R7303 Prediabetes: Secondary | ICD-10-CM

## 2018-02-15 LAB — HEMOGLOBIN A1C: HEMOGLOBIN A1C: 5.6 % (ref 4.6–6.5)

## 2018-02-15 LAB — COMPREHENSIVE METABOLIC PANEL
ALT: 24 U/L (ref 0–35)
AST: 22 U/L (ref 0–37)
Albumin: 4.3 g/dL (ref 3.5–5.2)
Alkaline Phosphatase: 70 U/L (ref 39–117)
BUN: 13 mg/dL (ref 6–23)
CHLORIDE: 105 meq/L (ref 96–112)
CO2: 29 mEq/L (ref 19–32)
Calcium: 9.6 mg/dL (ref 8.4–10.5)
Creatinine, Ser: 0.88 mg/dL (ref 0.40–1.20)
GFR: 70.43 mL/min (ref >60.00)
GLUCOSE: 110 mg/dL — AB (ref 70–99)
POTASSIUM: 5.1 meq/L (ref 3.5–5.1)
SODIUM: 140 meq/L (ref 135–145)
Total Bilirubin: 0.8 mg/dL (ref 0.2–1.2)
Total Protein: 6.8 g/dL (ref 6.0–8.3)

## 2018-02-15 LAB — TSH: TSH: 1.47 u[IU]/mL (ref 0.35–4.50)

## 2018-02-15 LAB — LIPID PANEL
Cholesterol: 200 mg/dL (ref 0–200)
HDL: 72.3 mg/dL (ref >39.00)
LDL Cholesterol: 111 mg/dL — ABNORMAL HIGH (ref 0–99)
NONHDL: 127.53
Total CHOL/HDL Ratio: 3
Triglycerides: 85 mg/dL (ref 0.0–149.0)
VLDL: 17 mg/dL (ref 0.0–40.0)

## 2018-02-15 LAB — T4, FREE: FREE T4: 0.79 ng/dL (ref 0.60–1.60)

## 2018-02-15 LAB — T3, FREE: T3, Free: 3.1 pg/mL (ref 2.3–4.2)

## 2018-02-15 NOTE — Telephone Encounter (Signed)
-----   Message from Lendon Collar, RT sent at 02/09/2018  9:51 AM EST ----- Regarding: Lab orders for Monday 02/15/18 Please enter CPE lab orders for 02/15/18. Thanks!

## 2018-02-23 ENCOUNTER — Encounter: Payer: Self-pay | Admitting: Family Medicine

## 2018-02-23 ENCOUNTER — Ambulatory Visit (INDEPENDENT_AMBULATORY_CARE_PROVIDER_SITE_OTHER): Payer: BLUE CROSS/BLUE SHIELD | Admitting: Family Medicine

## 2018-02-23 VITALS — BP 110/70 | HR 61 | Temp 98.5°F | Ht 63.5 in | Wt 221.5 lb

## 2018-02-23 DIAGNOSIS — Z0000 Encounter for general adult medical examination without abnormal findings: Secondary | ICD-10-CM | POA: Diagnosis not present

## 2018-02-23 DIAGNOSIS — E669 Obesity, unspecified: Secondary | ICD-10-CM

## 2018-02-23 DIAGNOSIS — E039 Hypothyroidism, unspecified: Secondary | ICD-10-CM

## 2018-02-23 DIAGNOSIS — M0579 Rheumatoid arthritis with rheumatoid factor of multiple sites without organ or systems involvement: Secondary | ICD-10-CM

## 2018-02-23 DIAGNOSIS — R7303 Prediabetes: Secondary | ICD-10-CM

## 2018-02-23 DIAGNOSIS — E7800 Pure hypercholesterolemia, unspecified: Secondary | ICD-10-CM

## 2018-02-23 MED ORDER — TRAMADOL HCL 50 MG PO TABS: 50.0000 mg | ORAL_TABLET | Freq: Every day | ORAL | 0 refills | Status: DC | PRN

## 2018-02-23 NOTE — Assessment & Plan Note (Signed)
Well controlled. Continue current medication.  

## 2018-02-23 NOTE — Assessment & Plan Note (Signed)
Improved control. No indication for statin.

## 2018-02-23 NOTE — Assessment & Plan Note (Addendum)
Followed by Rheum.  Tolerable control on methotrexate and Humira.  Refilled tramadol   To use prn pain. No red flags or concern for abuse/overuse.

## 2018-02-23 NOTE — Assessment & Plan Note (Signed)
Low carb diet 

## 2018-02-23 NOTE — Patient Instructions (Addendum)
Work on starting exercise regimen as tolerated. Goal 150 min per weeks.  Call  to set up mammogram on your own.

## 2018-02-23 NOTE — Assessment & Plan Note (Signed)
Encouraged exercise, weight loss, healthy eating habits. ? ?

## 2018-02-23 NOTE — Progress Notes (Signed)
Subjective:    Patient ID: Amy Hart, female    DOB: 10-12-1960, 57 y.o.   MRN: [id number]  HPI  The patient is here for annual wellness exam and preventative care.     Mother passed away from  Afib, Cdiff etc. She is healing from this well.  Elevated Cholesterol:  Improved control compared to last year.. NO indication for statin per AHA risk calculator. Lab Results  Component Value Date   CHOL 200 02/15/2018   HDL 72.30 02/15/2018   LDLCALC 111 (H) 02/15/2018   LDLDIRECT 131.3 11/25/2011   TRIG 85.0 02/15/2018   CHOLHDL 3 02/15/2018  Using medications without problems: Muscle aches:  Diet compliance: poor eating habits given stress over the summer.. Mother passed away. Exercise: Planning on starting exercise regimen. Other complaints:  Prediabetes  Lab Results  Component Value Date   HGBA1C 5.6 02/15/2018    Hypothyroid : Continue current dose of levothyroxine. Lab Results  Component Value Date   TSH 1.47 02/15/2018   Rheumatoid Arthritis: on methotrexate and humira (increase back to q3 weeks) with moderate control.  She has had progression of RA.She has had improvement with turmeric. Followed by Estanislado Pandy.  Recent flare.. Treated with prednisone. Requests refill of tramadol to use for chronic pain... Use 1 a day to have available as needed.    Office Visit from 02/23/2018 in [place] at [facility] Total Score  0      Social History /Family History/Past Medical History reviewed in detail and updated in EMR if needed. Blood pressure 110/70, pulse 61, temperature 98.5 F (36.9 C), temperature source Oral, height 5' 3.5" (1.613 m), weight 221 lb 8 oz (100.5 kg).   Body mass index is 38.62 kg/m.    Review of Systems  Constitutional: Negative for fatigue and fever.  HENT: Negative for congestion.   Eyes: Negative for pain.  Respiratory: Negative for cough and shortness of breath.   Cardiovascular: Negative for chest pain,  palpitations and leg swelling.  Gastrointestinal: Negative for abdominal pain.  Genitourinary: Negative for dysuria and vaginal bleeding.  Musculoskeletal: Negative for back pain.  Neurological: Negative for syncope, light-headedness and headaches.  Psychiatric/Behavioral: Negative for dysphoric mood.       Objective:   Physical Exam  Constitutional: Vital signs are normal. She appears well-developed and well-nourished. She is cooperative.  Non-toxic appearance. She does not appear ill. No distress.   Central obesity.  HENT:  Head: Normocephalic.  Right Ear: Hearing, tympanic membrane, external ear and ear canal normal.  Left Ear: Hearing, tympanic membrane, external ear and ear canal normal.  Nose: Nose normal.  Eyes: Pupils are equal, round, and reactive to light. Conjunctivae, EOM and lids are normal. Lids are everted and swept, no foreign bodies found.  Neck: Trachea normal and normal range of motion. Neck supple. Carotid bruit is not present. No thyroid mass and no thyromegaly present.  Cardiovascular: Normal rate, regular rhythm, S1 normal, S2 normal, normal heart sounds and intact distal pulses. Exam reveals no gallop.  No murmur heard. Pulmonary/Chest: Effort normal and breath sounds normal. No respiratory distress. She has no wheezes. She has no rhonchi. She has no rales.  Abdominal: Soft. Normal appearance and bowel sounds are normal. She exhibits no distension, no fluid wave, no abdominal bruit and no mass. There is no hepatosplenomegaly. There is no tenderness. There is no rebound, no guarding and no CVA tenderness. No hernia.  Lymphadenopathy:    She has no cervical adenopathy.  She has no axillary adenopathy.  Neurological: She is alert. She has normal strength. No cranial nerve deficit or sensory deficit.  Skin: Skin is warm, dry and intact. No rash noted.  Psychiatric: Her speech is normal and behavior is normal. Judgment normal. Her mood appears not anxious. Cognition  and memory are normal. She does not exhibit a depressed mood.          Assessment & Plan:  The patient's preventative maintenance and recommended screening tests for an annual wellness exam were reviewed in full today. Brought up to date unless services declined.  Counselled on the importance of diet, exercise, and its role in overall health and mortality. The patient's FH and SH was reviewed, including their home life, tobacco status, and drug and alcohol status.   Vaccines: [facility] flu [facility], PCV 13 uptodate, PCV23 2018 and PCV 23 in 5 years. Nonsmoker.  PAP/DVE: Q 5year, last done 06/2013 nml , no HPV , DVE not indicated.. Asymptomatic. Mammo: 12/2015, q2 years no family history for breast cancer. Due. Colon: 01/2012 Dr. Carlean Purl, nml repeat in 10 years STD screening: refused Hep C : done

## 2018-03-10 ENCOUNTER — Other Ambulatory Visit: Payer: Self-pay | Admitting: Family Medicine

## 2018-04-01 DIAGNOSIS — Z1231 Encounter for screening mammogram for malignant neoplasm of breast: Secondary | ICD-10-CM | POA: Diagnosis not present

## 2018-04-01 LAB — HM MAMMOGRAPHY

## 2018-04-06 ENCOUNTER — Encounter: Payer: Self-pay | Admitting: Family Medicine

## 2018-04-15 ENCOUNTER — Other Ambulatory Visit: Payer: Self-pay | Admitting: Rheumatology

## 2018-04-15 NOTE — Telephone Encounter (Signed)
Last visit: 01/05/18 Next Visit: 06/10/18 Labs: 01/05/18 WNL  Patient advised she is due to update labs. Patient will update 04/16/18  Okay to refill 30 day supply per Dr. Estanislado Pandy

## 2018-04-16 ENCOUNTER — Other Ambulatory Visit: Payer: Self-pay

## 2018-04-16 DIAGNOSIS — Z79899 Other long term (current) drug therapy: Secondary | ICD-10-CM | POA: Diagnosis not present

## 2018-04-17 LAB — CBC WITH DIFFERENTIAL/PLATELET
Absolute Monocytes: 413 cells/uL (ref 200–950)
BASOS ABS: 29 {cells}/uL (ref 0–200)
Basophils Relative: 0.6 %
EOS ABS: 110 {cells}/uL (ref 15–500)
Eosinophils Relative: 2.3 %
HEMATOCRIT: 42.4 % (ref 35.0–45.0)
HEMOGLOBIN: 14.4 g/dL (ref 11.7–15.5)
LYMPHS ABS: 1728 {cells}/uL (ref 850–3900)
MCH: 33.3 pg — AB (ref 27.0–33.0)
MCHC: 34 g/dL (ref 32.0–36.0)
MCV: 98.1 fL (ref 80.0–100.0)
MPV: 12.2 fL (ref 7.5–12.5)
Monocytes Relative: 8.6 %
NEUTROS ABS: 2520 {cells}/uL (ref 1500–7800)
Neutrophils Relative %: 52.5 %
Platelets: 209 10*3/uL (ref 140–400)
RBC: 4.32 10*6/uL (ref 3.80–5.10)
RDW: 13.6 % (ref 11.0–15.0)
Total Lymphocyte: 36 %
WBC: 4.8 10*3/uL (ref 3.8–10.8)

## 2018-04-17 LAB — COMPLETE METABOLIC PANEL WITH GFR
AG RATIO: 1.6 (calc) (ref 1.0–2.5)
ALBUMIN MSPROF: 4.1 g/dL (ref 3.6–5.1)
ALKALINE PHOSPHATASE (APISO): 68 U/L (ref 33–130)
ALT: 24 U/L (ref 6–29)
AST: 25 U/L (ref 10–35)
BILIRUBIN TOTAL: 0.7 mg/dL (ref 0.2–1.2)
BUN: 18 mg/dL (ref 7–25)
CO2: 27 mmol/L (ref 20–32)
CREATININE: 0.79 mg/dL (ref 0.50–1.05)
Calcium: 9.4 mg/dL (ref 8.6–10.4)
Chloride: 107 mmol/L (ref 98–110)
GFR, Est African American: 96 mL/min/{1.73_m2} (ref >=60)
GFR, Est Non African American: 83 mL/min/{1.73_m2} (ref >=60)
GLOBULIN: 2.6 g/dL (ref 1.9–3.7)
Glucose, Bld: 94 mg/dL (ref 65–99)
POTASSIUM: 5.2 mmol/L (ref 3.5–5.3)
SODIUM: 143 mmol/L (ref 135–146)
Total Protein: 6.7 g/dL (ref 6.1–8.1)

## 2018-04-19 ENCOUNTER — Other Ambulatory Visit: Payer: Self-pay | Admitting: *Deleted

## 2018-04-19 MED ORDER — ADALIMUMAB 40 MG/0.4ML ~~LOC~~ PSKT: 40.0000 mg | PREFILLED_SYRINGE | SUBCUTANEOUS | 0 refills | Status: DC

## 2018-04-19 NOTE — Progress Notes (Signed)
Labs are WNL.

## 2018-04-19 NOTE — Telephone Encounter (Signed)
Refill request received via fax   Last visit: 01/05/18 Next Visit: 06/10/18 Labs: 04/16/18 WNL TB Gold: 01/05/18  Okay to refill per Dr. Estanislado Pandy

## 2018-05-03 ENCOUNTER — Encounter: Payer: Self-pay | Admitting: Rheumatology

## 2018-05-06 ENCOUNTER — Other Ambulatory Visit: Payer: Self-pay | Admitting: Rheumatology

## 2018-05-06 NOTE — Telephone Encounter (Signed)
Last visit: 01/05/18 Next Visit: 06/10/18 Labs: 04/16/18 WNL  Okay to refill per Dr. Estanislado Pandy

## 2018-05-27 NOTE — Progress Notes (Signed)
Office Visit Note  Patient: Amy Hart             Date of Birth: [birth year]           MRN: [id number]             PCP: Jinny Sanders, MD Referring: Jinny Sanders, MD Visit Date: 06/10/2018 Occupation: @GUAROCC @  Subjective:  Bilateral 5th PIP joint pain    History of Present Illness: Amy Hart is a 58 y.o. female with history of seropositive rheumatoid arthritis and osteoarthritis.  Patient is on Humira 40 mg IV injections every 2 weeks, methotrexate 5 tablets by mouth once weekly, folic acid 1 mg by mouth daily.  She reports that 3 weeks ago she had an upper respiratory tract infection and held her dose of Humira.  She reports she continues have some congestion and has been taking Mucinex daily.  She denies any recent fevers.  She continues to have pain and swelling in bilateral fifth PIP joints.  She states that this winter she has had increased stiffness and joint aching.  She has occasional pain in bilateral knee joints but denies any joint swelling.    Activities of Daily Living:  Patient reports morning stiffness for 30 minutes.   Patient Reports nocturnal pain.  Difficulty dressing/grooming: Denies Difficulty climbing stairs: Denies Difficulty getting out of chair: Denies Difficulty using hands for taps, buttons, cutlery, and/or writing: Reports  Review of Systems  Constitutional: Positive for fatigue.  HENT: Negative for mouth sores, mouth dryness and nose dryness.   Eyes: Negative for pain, visual disturbance and dryness.  Respiratory: Positive for cough. Negative for hemoptysis, shortness of breath and difficulty breathing.   Cardiovascular: Negative for chest pain, palpitations, hypertension and swelling in legs/feet.  Gastrointestinal: Negative for blood in stool, constipation and diarrhea.  Endocrine: Negative for increased urination.  Genitourinary: Negative for painful urination.  Musculoskeletal: Positive for arthralgias, joint pain and morning  stiffness. Negative for joint swelling, myalgias, muscle weakness, muscle tenderness and myalgias.  Skin: Negative for color change, pallor, rash, hair loss, nodules/bumps, skin tightness, ulcers and sensitivity to sunlight.  Allergic/Immunologic: Negative for susceptible to infections.  Neurological: Negative for dizziness, numbness, headaches and weakness.  Hematological: Negative for swollen glands.  Psychiatric/Behavioral: Negative for depressed mood and sleep disturbance. The patient is not nervous/anxious.     PMFS History:  Patient Active Problem List   Diagnosis Date Noted  . Prediabetes 01/08/2017  . Hyperkalemia 12/07/2015  . Obesity (BMI 35.0-39.9 without comorbidity) 12/07/2015  . Epistaxis, recurrent 12/05/2014  . Menopausal syndrome 12/05/2014  . Rheumatoid arthritis ([place]) 01/02/2012  . Pure hypercholesterolemia 08/29/2009  . 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 ROTATOR CUFF SYNDROME 08/04/2007  . Hypothyroidism 05/19/2007  . ALLERGIC RHINITIS, SEASONAL 05/19/2007    Past Medical History:  Diagnosis Date  . Allergic rhinitis due to pollen   . Collagen vascular disease ([place])   . Rheumatoid arthritis(714.0)   . Unspecified hypothyroidism     Family History  Problem Relation Age of Onset  . Hypertension Mother   . Thyroid disease Mother   . Nephrolithiasis Mother   . Diabetes Father   . Kidney Stones Sister    Past Surgical History:  Procedure Laterality Date  . BREAST BIOPSY  2003   right, negative  . ENDOMETRIAL ABLATION  2005  . FRACTURE SURGERY  01/01   right elbow  . ROTATOR CUFF REPAIR  06/2008   R. Cuff tear  . TAYLOR BUNIONECTOMY  right  . TREATMENT FISTULA ANAL  1993   repair   Social History   Social History Narrative   No exercise   Diet: Moderate   She has a Sales promotion account executive   Lives with her partner   Immunization History  Administered Date(s) Administered  . Influenza,inj,Quad PF,6+ Mos 12/07/2015, 01/09/2018  .  Influenza-Unspecified 01/10/2015, 01/08/2017  . Pneumococcal Conjugate-13 12/07/2015  . Pneumococcal Polysaccharide-23 01/08/2017  . Td 02/05/1998, 07/13/2008  . Zoster 09/09/2013     Objective: Vital Signs: BP 125/80 (BP Location: Left Arm, Patient Position: Sitting, Cuff Size: Large)   Pulse 78   Resp 15   Ht 5\' 4"  (1.626 m)   Wt 226 lb (102.5 kg)   BMI 38.79 kg/m    Physical Exam Vitals signs and nursing note reviewed.  Constitutional:      Appearance: She is well-developed.  HENT:     Head: Normocephalic and atraumatic.  Eyes:     Conjunctiva/sclera: Conjunctivae normal.  Neck:     Musculoskeletal: Normal range of motion.  Cardiovascular:     Rate and Rhythm: Normal rate and regular rhythm.     Heart sounds: Normal heart sounds.  Pulmonary:     Effort: Pulmonary effort is normal.     Breath sounds: Normal breath sounds.  Abdominal:     General: Bowel sounds are normal.     Palpations: Abdomen is soft.  Lymphadenopathy:     Cervical: No cervical adenopathy.  Skin:    General: Skin is warm and dry.     Capillary Refill: Capillary refill takes less than 2 seconds.  Neurological:     Mental Status: She is alert and oriented to person, place, and time.  Psychiatric:        Behavior: Behavior normal.      Musculoskeletal Exam: C-spine, thoracic spine, lumbar spine good range of motion.  No midline spinal tenderness.  No SI joint tenderness.  Shoulder joints, elbow joints, wrist joints, MCPs, PIPs, DIPs good range of motion with no synovitis.  She has tenderness of bilateral fifth PIP joints.  She has inflammation of the left fifth PIP joint.  Hip joints, knee joints, ankle joints, MCPs, PIPs, DIPs good range of motion with no synovitis.  No warmth or effusion bilateral knee joints.  No tender swelling of ankle joints.  No tenderness over trochanter bursa bilaterally.  CDAI Exam: CDAI Score: Not documented Patient Global Assessment: Not documented; Provider Global  Assessment: Not documented Swollen: Not documented; Tender: Not documented Joint Exam   Not documented   There is currently no information documented on the homunculus. Go to the Rheumatology activity and complete the homunculus joint exam.  Investigation: No additional findings.  Imaging: No results found.  Recent Labs: Lab Results  Component Value Date   WBC 4.8 04/16/2018   HGB 14.4 04/16/2018   PLT 209 04/16/2018   NA 143 04/16/2018   K 5.2 04/16/2018   CL 107 04/16/2018   CO2 27 04/16/2018   GLUCOSE 94 04/16/2018   BUN 18 04/16/2018   CREATININE 0.79 04/16/2018   BILITOT 0.7 04/16/2018   ALKPHOS 70 02/15/2018   AST 25 04/16/2018   ALT 24 04/16/2018   PROT 6.7 04/16/2018   ALBUMIN 4.3 02/15/2018   CALCIUM 9.4 04/16/2018   GFRAA 96 04/16/2018   QFTBGOLDPLUS NEGATIVE 01/05/2018    Speciality Comments: No specialty comments available.  Procedures:  Small Joint Inj: R ring PIP on 06/10/2018 2:57 PM Details: 27 G needle, ultrasound-guided  dorsal approach  Spinal Needle: No  Medications: 0.3 mL lidocaine 1 %; 10 mg triamcinolone acetonide 40 MG/ML Aspirate: 0 mL Procedure, treatment alternatives, risks and benefits explained, specific risks discussed. Immediately prior to procedure a time out was called to verify the correct patient, procedure, equipment, support staff and site/side marked as required. Patient was prepped and draped in the usual sterile fashion.   Small Joint Inj: L small PIP on 06/10/2018 2:57 PM Indications: pain and joint swelling Details: 27 G needle, ultrasound-guided dorsal approach  Spinal Needle: No  Medications: 0.3 mL lidocaine 1 %; 10 mg triamcinolone acetonide 40 MG/ML Procedure, treatment alternatives, risks and benefits explained, specific risks discussed. Immediately prior to procedure a time out was called to verify the correct patient, procedure, equipment, support staff and site/side marked as required. Patient was prepped and  draped in the usual sterile fashion.     Allergies: Naproxen   Assessment / Plan:     Visit Diagnoses: Rheumatoid arthritis involving multiple sites with positive rheumatoid factor ([place]): She has tenderness of bilateral fifth PIP joints.  She has inflammation of the left fifth PIP joint.  Bilateral ultrasound-guided cortisone junctions were performed today.  She tolerated the procedure well.  The procedure note was completed above.  She has no other joint pain or joint swelling at this time.  She will continue on Humira 40 mg subcutaneous injections every 14 days, methotrexate 5 tablets by mouth once weekly, and folic acid 1 mg by mouth daily.  She does not need any refills at this time.  She was advised to notify us that shows increased joint pain or joint swelling.  She will follow-up in the office in 5 months.  High risk medication use - Humira 40 mg every 14 days, methotrexate 5 tablets weekly, folic acid 1 mg daily.  Last TB gold negative on 01/05/2018 and will monitor yearly.  Most recent CBC/CMP within normal limits on 04/16/2018 and will monitor every 3 months.  Standing orders are in place.   Primary osteoarthritis of both hands: She has PIP and DIP synovial thickening consistent with osteoarthritis of bilateral hands.  She has tenderness of bilateral fifth PIP joints.  She has inflammation of the left fifth PIP joint.  She has complete fist formation bilaterally.  Joint protection and muscle strengthening were discussed.  Other fatigue: Chronic but stable.  Other medical conditions are listed as follows:  History of hypothyroidism  Prediabetes  History of kidney stones  History of hematuria   Orders: No orders of the defined types were placed in this encounter.  No orders of the defined types were placed in this encounter.   Face-to-face time spent with patient was 30 minutes.  Greater than 50% of time was spent in counseling and coordination of care.  Follow-Up Instructions:  Return in about 5 months (around 11/10/2018) for Rheumatoid arthritis, Osteoarthritis.   Ofilia Neas, PA-C   I examined and evaluated the patient with Hazel Sams PA.  Patient's arthritis is quite well controlled except for bilateral fifth PIP swelling.  Today after different treatment options were discussed bilateral fifth PIP joints were injected with cortisone as described above.  She tolerated the procedure well.  Finger splints were applied.The plan of care was discussed as noted above.  Bo Merino, MD  Note - This record has been created using Editor, commissioning.  Chart creation errors have been sought, but may not always  have been located. Such creation errors do not reflect on  the standard of medical care. 

## 2018-06-10 ENCOUNTER — Encounter: Payer: Self-pay | Admitting: Physician Assistant

## 2018-06-10 ENCOUNTER — Ambulatory Visit: Payer: BLUE CROSS/BLUE SHIELD | Admitting: Physician Assistant

## 2018-06-10 VITALS — BP 125/80 | HR 78 | Resp 15 | Ht 64.0 in | Wt 226.0 lb

## 2018-06-10 DIAGNOSIS — M79641 Pain in right hand: Secondary | ICD-10-CM | POA: Diagnosis not present

## 2018-06-10 DIAGNOSIS — M19041 Primary osteoarthritis, right hand: Secondary | ICD-10-CM

## 2018-06-10 DIAGNOSIS — R7303 Prediabetes: Secondary | ICD-10-CM

## 2018-06-10 DIAGNOSIS — M79642 Pain in left hand: Secondary | ICD-10-CM

## 2018-06-10 DIAGNOSIS — R5383 Other fatigue: Secondary | ICD-10-CM

## 2018-06-10 DIAGNOSIS — M19042 Primary osteoarthritis, left hand: Secondary | ICD-10-CM

## 2018-06-10 DIAGNOSIS — Z79899 Other long term (current) drug therapy: Secondary | ICD-10-CM | POA: Diagnosis not present

## 2018-06-10 DIAGNOSIS — Z87442 Personal history of urinary calculi: Secondary | ICD-10-CM

## 2018-06-10 DIAGNOSIS — M0579 Rheumatoid arthritis with rheumatoid factor of multiple sites without organ or systems involvement: Secondary | ICD-10-CM | POA: Diagnosis not present

## 2018-06-10 DIAGNOSIS — Z8639 Personal history of other endocrine, nutritional and metabolic disease: Secondary | ICD-10-CM

## 2018-06-10 DIAGNOSIS — Z87448 Personal history of other diseases of urinary system: Secondary | ICD-10-CM

## 2018-06-10 MED ORDER — TRIAMCINOLONE ACETONIDE 40 MG/ML IJ SUSP
10.0000 mg | INTRAMUSCULAR | Status: AC | PRN
  Administered 2018-06-10: 10 mg via INTRA_ARTICULAR

## 2018-06-10 MED ORDER — LIDOCAINE HCL 1 % IJ SOLN
0.3000 mL | INTRAMUSCULAR | Status: AC | PRN
  Administered 2018-06-10: .3 mL

## 2018-06-10 NOTE — Patient Instructions (Signed)
Standing Labs We placed an order today for your standing lab work.    Please come back and get your standing labs in April and every 3 months  We have open lab Monday through Friday from 8:30-11:30 AM and 1:30-4:00 PM  at the office of Dr. Shaili Deveshwar.   You may experience shorter wait times on Monday and Friday afternoons. The office is located at [address], [address], [place], [place] [address] No appointment is necessary.   Labs are drawn by [facility].  You may receive a bill from [facility] for your lab work.  If you wish to have your labs drawn at another location, please call the office 24 hours in advance to send orders.  If you have any questions regarding directions or hours of operation,  please call [phone].   Just as a reminder please drink plenty of water prior to coming for your lab work. Thanks!  

## 2018-07-21 ENCOUNTER — Other Ambulatory Visit: Payer: Self-pay | Admitting: Rheumatology

## 2018-07-22 NOTE — Telephone Encounter (Signed)
Last visit: 06/10/18 Next visit: 11/11/18 Labs: 04/16/18 WNL TB Gold: 01/05/18 Neg   Okay to refill per Dr. Estanislado Pandy

## 2018-07-28 ENCOUNTER — Other Ambulatory Visit: Payer: Self-pay | Admitting: Rheumatology

## 2018-07-28 NOTE — Telephone Encounter (Signed)
Last Visit: 06/10/2018 Next Visit: 11/11/2018 Labs: 04/16/2018 WNL   Okay to refill per Dr. Estanislado Pandy.

## 2018-10-20 ENCOUNTER — Other Ambulatory Visit: Payer: Self-pay | Admitting: Rheumatology

## 2018-10-20 NOTE — Telephone Encounter (Addendum)
Last visit: 06/10/18 Next visit: 11/11/18 Labs: 04/16/2018 WNL  Left message to advise patient she is due to update labs.    Okay to refill 30 day supply MTX?

## 2018-10-20 NOTE — Telephone Encounter (Signed)
Please advise patient to update lab work ASAP.  It has been 6 months since lab work was drawn.

## 2018-10-22 ENCOUNTER — Other Ambulatory Visit: Payer: Self-pay | Admitting: *Deleted

## 2018-10-22 DIAGNOSIS — Z79899 Other long term (current) drug therapy: Secondary | ICD-10-CM | POA: Diagnosis not present

## 2018-10-23 LAB — CBC WITH DIFFERENTIAL/PLATELET
Absolute Monocytes: 345 cells/uL (ref 200–950)
Basophils Absolute: 30 cells/uL (ref 0–200)
Basophils Relative: 0.6 %
Eosinophils Absolute: 100 cells/uL (ref 15–500)
Eosinophils Relative: 2 %
HCT: 41.9 % (ref 35.0–45.0)
Hemoglobin: 14.5 g/dL (ref 11.7–15.5)
Lymphs Abs: 1715 cells/uL (ref 850–3900)
MCH: 34.4 pg — ABNORMAL HIGH (ref 27.0–33.0)
MCHC: 34.6 g/dL (ref 32.0–36.0)
MCV: 99.5 fL (ref 80.0–100.0)
MPV: 12.4 fL (ref 7.5–12.5)
Monocytes Relative: 6.9 %
Neutro Abs: 2810 cells/uL (ref 1500–7800)
Neutrophils Relative %: 56.2 %
Platelets: 167 10*3/uL (ref 140–400)
RBC: 4.21 10*6/uL (ref 3.80–5.10)
RDW: 13.5 % (ref 11.0–15.0)
Total Lymphocyte: 34.3 %
WBC: 5 10*3/uL (ref 3.8–10.8)

## 2018-10-23 LAB — COMPLETE METABOLIC PANEL WITH GFR
AG Ratio: 1.7 (calc) (ref 1.0–2.5)
ALT: 17 U/L (ref 6–29)
AST: 19 U/L (ref 10–35)
Albumin: 4 g/dL (ref 3.6–5.1)
Alkaline phosphatase (APISO): 70 U/L (ref 37–153)
BUN: 16 mg/dL (ref 7–25)
CO2: 30 mmol/L (ref 20–32)
Calcium: 9.4 mg/dL (ref 8.6–10.4)
Chloride: 105 mmol/L (ref 98–110)
Creat: 0.81 mg/dL (ref 0.50–1.05)
GFR, Est African American: 93 mL/min/{1.73_m2} (ref >=60)
GFR, Est Non African American: 81 mL/min/{1.73_m2} (ref >=60)
Globulin: 2.4 g/dL (calc) (ref 1.9–3.7)
Glucose, Bld: 133 mg/dL — ABNORMAL HIGH (ref 65–99)
Potassium: 4.5 mmol/L (ref 3.5–5.3)
Sodium: 141 mmol/L (ref 135–146)
Total Bilirubin: 0.6 mg/dL (ref 0.2–1.2)
Total Protein: 6.4 g/dL (ref 6.1–8.1)

## 2018-10-25 NOTE — Progress Notes (Signed)
Glucose is elevated-133.  Rest of CMP WNL.  MCH is borderline elevated but stable. Rest of CBC WNL.

## 2018-10-28 NOTE — Progress Notes (Signed)
Office Visit Note  Patient: Amy Hart             Date of Birth: Dec 14, 1960           MRN: [id number]             PCP: Jinny Sanders, MD Referring: Jinny Sanders, MD Visit Date: 11/11/2018 Occupation: @GUAROCC @  Subjective:  Medication monitoring  History of Present Illness: Amy Hart is a 58 y.o. female with history of rheumatoid arthritis and osteoarthritis overlap.  She states she had good response to bilateral fifth  trigger finger injection.  She had intermittent locking in her right fifth finger but has improved.  She denies any joint swelling or joint pain currently.  She has been responding well to Humira and methotrexate combination.  Activities of Daily Living:  Patient reports morning stiffness for 30  minutes.   Patient Denies nocturnal pain.  Difficulty dressing/grooming: Denies Difficulty climbing stairs: Denies Difficulty getting out of chair: Denies Difficulty using hands for taps, buttons, cutlery, and/or writing: Denies  Review of Systems  Constitutional: Positive for fatigue. Negative for night sweats, weight gain and weight loss.  HENT: Negative for mouth sores, trouble swallowing, trouble swallowing, mouth dryness and nose dryness.   Eyes: Negative for pain, redness, visual disturbance and dryness.  Respiratory: Negative for cough, hemoptysis, shortness of breath and difficulty breathing.   Cardiovascular: Negative for chest pain, palpitations, hypertension, irregular heartbeat and swelling in legs/feet.  Gastrointestinal: Negative for blood in stool, constipation and diarrhea.  Endocrine: Negative for increased urination.  Genitourinary: Negative for painful urination and vaginal dryness.  Musculoskeletal: Positive for arthralgias and joint pain. Negative for joint swelling, myalgias, muscle weakness, morning stiffness, muscle tenderness and myalgias.  Skin: Negative for color change, pallor, rash, hair loss, nodules/bumps, skin tightness, ulcers  and sensitivity to sunlight.  Allergic/Immunologic: Negative for susceptible to infections.  Neurological: Negative for dizziness, numbness, headaches, memory loss, night sweats and weakness.  Hematological: Negative for swollen glands.  Psychiatric/Behavioral: Negative for depressed mood and sleep disturbance. The patient is not nervous/anxious.     PMFS History:  Patient Active Problem List   Diagnosis Date Noted  . Prediabetes 01/08/2017  . Hyperkalemia 12/07/2015  . Obesity (BMI 35.0-39.9 without comorbidity) 12/07/2015  . Epistaxis, recurrent 12/05/2014  . Menopausal syndrome 12/05/2014  . Rheumatoid arthritis ([place]) 01/02/2012  . Pure hypercholesterolemia 08/29/2009  . 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 ROTATOR CUFF SYNDROME 08/04/2007  . Hypothyroidism 05/19/2007  . ALLERGIC RHINITIS, SEASONAL 05/19/2007    Past Medical History:  Diagnosis Date  . Allergic rhinitis due to pollen   . Collagen vascular disease ([place])   . Rheumatoid arthritis(714.0)   . Unspecified hypothyroidism     Family History  Problem Relation Age of Onset  . Hypertension Mother   . Thyroid disease Mother   . Nephrolithiasis Mother   . Diabetes Father   . Kidney Stones Sister    Past Surgical History:  Procedure Laterality Date  . BREAST BIOPSY  2003   right, negative  . ENDOMETRIAL ABLATION  2005  . FRACTURE SURGERY  01/01   right elbow  . ROTATOR CUFF REPAIR  06/2008   R. Cuff tear  . TAYLOR BUNIONECTOMY     right  . TREATMENT FISTULA ANAL  1993   repair   Social History   Social History Narrative   No exercise   Diet: Moderate   She has a Sales promotion account executive   Lives with her  partner   Immunization History  Administered Date(s) Administered  . Influenza Inj Mdck Quad Pf 01/09/2018  . Influenza,inj,Quad PF,6+ Mos 12/07/2015, 01/09/2018  . Influenza-Unspecified 01/10/2015, 01/08/2017  . Pneumococcal Conjugate-13 12/07/2015  . Pneumococcal Polysaccharide-23 01/08/2017  . Td  02/05/1998, 07/13/2008  . Zoster 09/09/2013     Objective: Vital Signs: BP 126/76 (BP Location: Left Arm, Patient Position: Sitting, Cuff Size: Small)   Pulse 72   Resp 12   Ht 5\' 4"  (1.626 m)   Wt 227 lb (103 kg)   BMI 38.96 kg/m    Physical Exam Vitals signs and nursing note reviewed.  Constitutional:      Appearance: She is well-developed.  HENT:     Head: Normocephalic and atraumatic.  Eyes:     Conjunctiva/sclera: Conjunctivae normal.  Neck:     Musculoskeletal: Normal range of motion.  Cardiovascular:     Rate and Rhythm: Normal rate and regular rhythm.     Heart sounds: Normal heart sounds.  Pulmonary:     Effort: Pulmonary effort is normal.     Breath sounds: Normal breath sounds.  Abdominal:     General: Bowel sounds are normal.     Palpations: Abdomen is soft.  Lymphadenopathy:     Cervical: No cervical adenopathy.  Skin:    General: Skin is warm and dry.     Capillary Refill: Capillary refill takes less than 2 seconds.  Neurological:     Mental Status: She is alert and oriented to person, place, and time.  Psychiatric:        Behavior: Behavior normal.      Musculoskeletal Exam: C-spine thoracic and lumbar spine for good range of motion.  Shoulder joints, elbow joints, wrist joints with good range of motion.  She had DIP and PIP thickening consistent with osteoarthritis.  She has some synovial thickening over MCP joints but no synovitis was noted.  Hip joints and knee joints with good range of motion.  No swelling over MTPs was noted.  CDAI Exam: CDAI Score: 0.4  Patient Global: 2 mm; Provider Global: 2 mm Swollen: 0 ; Tender: 0  Joint Exam   No joint exam has been documented for this visit   There is currently no information documented on the homunculus. Go to the Rheumatology activity and complete the homunculus joint exam.  Investigation: No additional findings.  Imaging: No results found.  Recent Labs: Lab Results  Component Value Date    WBC 5.0 10/22/2018   HGB 14.5 10/22/2018   PLT 167 10/22/2018   NA 141 10/22/2018   K 4.5 10/22/2018   CL 105 10/22/2018   CO2 30 10/22/2018   GLUCOSE 133 (H) 10/22/2018   BUN 16 10/22/2018   CREATININE 0.81 10/22/2018   BILITOT 0.6 10/22/2018   ALKPHOS 70 02/15/2018   AST 19 10/22/2018   ALT 17 10/22/2018   PROT 6.4 10/22/2018   ALBUMIN 4.3 02/15/2018   CALCIUM 9.4 10/22/2018   GFRAA 93 10/22/2018   QFTBGOLDPLUS NEGATIVE 01/05/2018    Speciality Comments: No specialty comments available.  Procedures:  No procedures performed Allergies: Naproxen   Assessment / Plan:     Visit Diagnoses: Rheumatoid arthritis involving multiple sites with positive rheumatoid factor ([place]) -patient had no synovitis on examination today.  She is doing well on combination of Humira and methotrexate.  She has some synovial thickening.  High risk medication use - Humira 40 mg every 14 days, methotrexate 2.5 mg 5 tablets every 7 days, folic acid  1 mg 1 tablet daily.  Last TB gold negative on 01/05/2018 and will monitor yearly.  Future order placed for TB gold.  Most recent CBC/CMP within normal limits except for borderline elevated MCH but stable on 10/22/2018.  Due for CBC/CMP in October and will monitor every 3 months.  Standing orders are in place.  She received the flu vaccine in October and is up-to-date on pneumonia.  Recommend annual flu and Shingrix vaccine as indicated for immunosuppressant therapy.   Recommend yearly skin exams due to increased risk of melanoma.- Plan: QuantiFERON-TB Gold Plus, next lab in October.  Precautions during the pandemic were discussed.  Also she was advised to stop her medications in case she develops any infection.  Primary osteoarthritis of both hands -joint protection was discussed.  She also had bilateral fifth trigger finger which responded very well to the cortisone injection.  She has intermittent triggering of her right finger.  We will observe it for right now.   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  Other fatigue -improved.  History of hypothyroidism   Prediabetes  History of kidney stones   History of hematuria   Association of heart disease with rheumatoid arthritis was discussed. Need to monitor blood pressure, cholesterol, and to exercise 30-60 minutes on daily basis was discussed. Poor dental hygiene can be a predisposing factor for rheumatoid arthritis. Good dental hygiene was discussed.  Orders: Orders Placed This Encounter  Procedures  . QuantiFERON-TB Gold Plus   No orders of the defined types were placed in this encounter.    Follow-Up Instructions: Return in 5 months (on 04/13/2019) for Rheumatoid arthritis, Osteoarthritis.   Bo Merino, MD  Note - This record has been created using Editor, commissioning.  Chart creation errors have been sought, but may not always  have been located. Such creation errors do not reflect on  the standard of medical care.

## 2018-11-05 ENCOUNTER — Other Ambulatory Visit: Payer: Self-pay | Admitting: Rheumatology

## 2018-11-05 NOTE — Telephone Encounter (Signed)
Last visit: 06/10/18 Next visit: 11/11/18 Labs: 10/22/18 Glucose is elevated-133. Rest of CMP WNL. MCH is borderline elevated but stable. Rest of CBC WNL.  Okay to refill per Dr. Estanislado Pandy

## 2018-11-08 DIAGNOSIS — D224 Melanocytic nevi of scalp and neck: Secondary | ICD-10-CM | POA: Diagnosis not present

## 2018-11-08 DIAGNOSIS — D2371 Other benign neoplasm of skin of right lower limb, including hip: Secondary | ICD-10-CM | POA: Diagnosis not present

## 2018-11-11 ENCOUNTER — Other Ambulatory Visit: Payer: Self-pay

## 2018-11-11 ENCOUNTER — Ambulatory Visit (INDEPENDENT_AMBULATORY_CARE_PROVIDER_SITE_OTHER): Payer: BC Managed Care – PPO | Admitting: Rheumatology

## 2018-11-11 ENCOUNTER — Encounter: Payer: Self-pay | Admitting: Physician Assistant

## 2018-11-11 VITALS — BP 126/76 | HR 72 | Resp 12 | Ht 64.0 in | Wt 227.0 lb

## 2018-11-11 DIAGNOSIS — Z79899 Other long term (current) drug therapy: Secondary | ICD-10-CM | POA: Diagnosis not present

## 2018-11-11 DIAGNOSIS — M0579 Rheumatoid arthritis with rheumatoid factor of multiple sites without organ or systems involvement: Secondary | ICD-10-CM

## 2018-11-11 DIAGNOSIS — M24521 Contracture, right elbow: Secondary | ICD-10-CM | POA: Diagnosis not present

## 2018-11-11 DIAGNOSIS — M19041 Primary osteoarthritis, right hand: Secondary | ICD-10-CM

## 2018-11-11 DIAGNOSIS — R5383 Other fatigue: Secondary | ICD-10-CM

## 2018-11-11 DIAGNOSIS — M19042 Primary osteoarthritis, left hand: Secondary | ICD-10-CM

## 2018-11-11 DIAGNOSIS — R7303 Prediabetes: Secondary | ICD-10-CM

## 2018-11-11 DIAGNOSIS — Z8639 Personal history of other endocrine, nutritional and metabolic disease: Secondary | ICD-10-CM

## 2018-11-11 DIAGNOSIS — Z87448 Personal history of other diseases of urinary system: Secondary | ICD-10-CM

## 2018-11-11 DIAGNOSIS — Z87442 Personal history of urinary calculi: Secondary | ICD-10-CM

## 2018-11-11 NOTE — Patient Instructions (Signed)
Standing Labs We placed an order today for your standing lab work.    Please come back and get your standing labs in October and every 3 months  TB Gold with next lab  We have open lab daily Monday through Thursday from 8:30-12:30 PM and 1:30-4:30 PM and Friday from 8:30-12:30 PM and 1:30 -4:00 PM at the office of Dr. Bo Merino.   You may experience shorter wait times on Monday and Friday afternoons. The office is located at [address], [place], [place], [place] [address] No appointment is necessary.   Labs are drawn by [facility].  You may receive a bill from [facility] for your lab work.  If you wish to have your labs drawn at another location, please call the office 24 hours in advance to send orders.  If you have any questions regarding directions or hours of operation,  please call [phone].   Just as a reminder please drink plenty of water prior to coming for your lab work. Thanks!

## 2018-12-14 ENCOUNTER — Other Ambulatory Visit: Payer: Self-pay | Admitting: Rheumatology

## 2018-12-14 NOTE — Telephone Encounter (Signed)
Last Visit: 11/11/18 Next Visit: 04/14/19 Labs: 10/22/18 Glucose is elevated-133. Rest of CMP WNL. MCH is borderline elevated but stable. Rest of CBC WNL. Tb Gold: 01/05/18 Neg   Okay to refill per Dr. Estanislado Pandy

## 2019-01-12 ENCOUNTER — Other Ambulatory Visit: Payer: Self-pay | Admitting: Physician Assistant

## 2019-01-12 NOTE — Telephone Encounter (Signed)
Last Visit: 11/11/18 Next Visit: 04/14/19  Okay to refill per Dr. Estanislado Pandy

## 2019-01-19 ENCOUNTER — Telehealth: Payer: Self-pay | Admitting: Rheumatology

## 2019-01-19 ENCOUNTER — Other Ambulatory Visit: Payer: Self-pay | Admitting: Family Medicine

## 2019-01-19 NOTE — Telephone Encounter (Signed)
Patient needs refill on Folic Acid sent to [facility] on [address]

## 2019-01-19 NOTE — Telephone Encounter (Signed)
Patient advised we have sent prescription to the pharmacy on 01/12/19. Patient will contact the pharmacy.

## 2019-01-20 ENCOUNTER — Other Ambulatory Visit: Payer: Self-pay | Admitting: Family Medicine

## 2019-01-28 ENCOUNTER — Other Ambulatory Visit: Payer: Self-pay | Admitting: Rheumatology

## 2019-01-28 NOTE — Telephone Encounter (Addendum)
Last Visit: 11/11/18 Next Visit: 04/14/19 Labs: 10/22/18 Glucose is elevated-133. Rest of CMP WNL. MCH is borderline elevated but stable. Rest of CBC WNL.  Patient advised she is due to update labs. Patient will update next week.   Okay to refill 30 day supply per Dr. Estanislado Pandy.

## 2019-02-03 ENCOUNTER — Other Ambulatory Visit: Payer: Self-pay

## 2019-02-03 DIAGNOSIS — Z79899 Other long term (current) drug therapy: Secondary | ICD-10-CM | POA: Diagnosis not present

## 2019-02-04 NOTE — Progress Notes (Signed)
CMP WNL.  MCH is borderline elevated but trending down.  Rest of CBC WNL

## 2019-02-05 LAB — QUANTIFERON-TB GOLD PLUS
Mitogen-NIL: >10 IU/mL
NIL: 0.02 IU/mL
QuantiFERON-TB Gold Plus: NEGATIVE
TB1-NIL: 0 IU/mL
TB2-NIL: 0 IU/mL

## 2019-02-05 LAB — CBC WITH DIFFERENTIAL/PLATELET
Absolute Monocytes: 470 cells/uL (ref 200–950)
Basophils Absolute: 39 cells/uL (ref 0–200)
Basophils Relative: 0.8 %
Eosinophils Absolute: 69 cells/uL (ref 15–500)
Eosinophils Relative: 1.4 %
HCT: 42.9 % (ref 35.0–45.0)
Hemoglobin: 14.7 g/dL (ref 11.7–15.5)
Lymphs Abs: 1754 cells/uL (ref 850–3900)
MCH: 33.9 pg — ABNORMAL HIGH (ref 27.0–33.0)
MCHC: 34.3 g/dL (ref 32.0–36.0)
MCV: 98.8 fL (ref 80.0–100.0)
MPV: 12.1 fL (ref 7.5–12.5)
Monocytes Relative: 9.6 %
Neutro Abs: 2568 cells/uL (ref 1500–7800)
Neutrophils Relative %: 52.4 %
Platelets: 197 10*3/uL (ref 140–400)
RBC: 4.34 10*6/uL (ref 3.80–5.10)
RDW: 13.1 % (ref 11.0–15.0)
Total Lymphocyte: 35.8 %
WBC: 4.9 10*3/uL (ref 3.8–10.8)

## 2019-02-05 LAB — COMPLETE METABOLIC PANEL WITH GFR
AG Ratio: 1.7 (calc) (ref 1.0–2.5)
ALT: 20 U/L (ref 6–29)
AST: 21 U/L (ref 10–35)
Albumin: 4.2 g/dL (ref 3.6–5.1)
Alkaline phosphatase (APISO): 71 U/L (ref 37–153)
BUN: 22 mg/dL (ref 7–25)
CO2: 25 mmol/L (ref 20–32)
Calcium: 9.8 mg/dL (ref 8.6–10.4)
Chloride: 105 mmol/L (ref 98–110)
Creat: 0.61 mg/dL (ref 0.50–1.05)
GFR, Est African American: 117 mL/min/{1.73_m2} (ref >=60)
GFR, Est Non African American: 101 mL/min/{1.73_m2} (ref >=60)
Globulin: 2.5 g/dL (calc) (ref 1.9–3.7)
Glucose, Bld: 94 mg/dL (ref 65–99)
Potassium: 5.1 mmol/L (ref 3.5–5.3)
Sodium: 140 mmol/L (ref 135–146)
Total Bilirubin: 0.7 mg/dL (ref 0.2–1.2)
Total Protein: 6.7 g/dL (ref 6.1–8.1)

## 2019-02-07 NOTE — Progress Notes (Signed)
TB gold negative

## 2019-02-22 ENCOUNTER — Other Ambulatory Visit: Payer: Self-pay

## 2019-02-22 ENCOUNTER — Telehealth: Payer: Self-pay | Admitting: Family Medicine

## 2019-02-22 ENCOUNTER — Other Ambulatory Visit (INDEPENDENT_AMBULATORY_CARE_PROVIDER_SITE_OTHER): Payer: BC Managed Care – PPO

## 2019-02-22 DIAGNOSIS — E039 Hypothyroidism, unspecified: Secondary | ICD-10-CM

## 2019-02-22 DIAGNOSIS — E7800 Pure hypercholesterolemia, unspecified: Secondary | ICD-10-CM | POA: Diagnosis not present

## 2019-02-22 DIAGNOSIS — R7303 Prediabetes: Secondary | ICD-10-CM

## 2019-02-22 LAB — COMPREHENSIVE METABOLIC PANEL
ALT: 20 U/L (ref 0–35)
AST: 18 U/L (ref 0–37)
Albumin: 4.1 g/dL (ref 3.5–5.2)
Alkaline Phosphatase: 77 U/L (ref 39–117)
BUN: 13 mg/dL (ref 6–23)
CO2: 28 mEq/L (ref 19–32)
Calcium: 9.4 mg/dL (ref 8.4–10.5)
Chloride: 106 mEq/L (ref 96–112)
Creatinine, Ser: 0.79 mg/dL (ref 0.40–1.20)
GFR: 74.79 mL/min (ref >60.00)
Glucose, Bld: 105 mg/dL — ABNORMAL HIGH (ref 70–99)
Potassium: 4.4 mEq/L (ref 3.5–5.1)
Sodium: 141 mEq/L (ref 135–145)
Total Bilirubin: 0.7 mg/dL (ref 0.2–1.2)
Total Protein: 6.5 g/dL (ref 6.0–8.3)

## 2019-02-22 LAB — LIPID PANEL
Cholesterol: 207 mg/dL — ABNORMAL HIGH (ref 0–200)
HDL: 63.1 mg/dL (ref >39.00)
LDL Cholesterol: 124 mg/dL — ABNORMAL HIGH (ref 0–99)
NonHDL: 144.17
Total CHOL/HDL Ratio: 3
Triglycerides: 102 mg/dL (ref 0.0–149.0)
VLDL: 20.4 mg/dL (ref 0.0–40.0)

## 2019-02-22 LAB — HEMOGLOBIN A1C: Hgb A1c MFr Bld: 5.4 % (ref 4.6–6.5)

## 2019-02-22 LAB — TSH: TSH: 0.64 u[IU]/mL (ref 0.35–4.50)

## 2019-02-22 LAB — T3, FREE: T3, Free: 3.8 pg/mL (ref 2.3–4.2)

## 2019-02-22 LAB — T4, FREE: Free T4: 0.86 ng/dL (ref 0.60–1.60)

## 2019-02-22 NOTE — Telephone Encounter (Signed)
-----   Message from [name] sent at 02/14/2019  4:22 PM EST ----- Regarding: Lab orders for Tuesday, 11.17.20 Patient is scheduled for CPX labs, please order future labs, Thanks , Karna Christmas

## 2019-02-22 NOTE — Progress Notes (Signed)
No critical labs need to be addressed urgently. We will discuss labs in detail at upcoming office visit.   

## 2019-02-24 ENCOUNTER — Other Ambulatory Visit: Payer: Self-pay | Admitting: Rheumatology

## 2019-02-24 NOTE — Telephone Encounter (Signed)
Last Visit:11/11/18 Next Visit:04/14/19 Labs: 02/03/19 CMP WNL. MCH is borderline elevated but trending down. Rest of CBC WNL  Okay to refill per Dr. Estanislado Pandy

## 2019-03-01 ENCOUNTER — Encounter: Payer: BLUE CROSS/BLUE SHIELD | Admitting: Family Medicine

## 2019-03-08 ENCOUNTER — Encounter: Payer: Self-pay | Admitting: Family Medicine

## 2019-03-08 ENCOUNTER — Ambulatory Visit (INDEPENDENT_AMBULATORY_CARE_PROVIDER_SITE_OTHER): Payer: BC Managed Care – PPO | Admitting: Family Medicine

## 2019-03-08 ENCOUNTER — Other Ambulatory Visit: Payer: Self-pay

## 2019-03-08 ENCOUNTER — Other Ambulatory Visit (HOSPITAL_COMMUNITY)
Admission: RE | Admit: 2019-03-08 | Discharge: 2019-03-08 | Disposition: A | Discharge status: Home / Self Care | Payer: BC Managed Care – PPO | Source: Ambulatory Visit | Attending: Family Medicine | Admitting: Family Medicine

## 2019-03-08 VITALS — BP 124/78 | HR 67 | Temp 97.9°F | Ht 64.0 in | Wt 228.2 lb

## 2019-03-08 DIAGNOSIS — M0579 Rheumatoid arthritis with rheumatoid factor of multiple sites without organ or systems involvement: Secondary | ICD-10-CM

## 2019-03-08 DIAGNOSIS — Z124 Encounter for screening for malignant neoplasm of cervix: Secondary | ICD-10-CM

## 2019-03-08 DIAGNOSIS — R7303 Prediabetes: Secondary | ICD-10-CM | POA: Diagnosis not present

## 2019-03-08 DIAGNOSIS — E039 Hypothyroidism, unspecified: Secondary | ICD-10-CM | POA: Diagnosis not present

## 2019-03-08 DIAGNOSIS — Z0000 Encounter for general adult medical examination without abnormal findings: Secondary | ICD-10-CM | POA: Diagnosis not present

## 2019-03-08 DIAGNOSIS — Z23 Encounter for immunization: Secondary | ICD-10-CM | POA: Diagnosis not present

## 2019-03-08 DIAGNOSIS — E7800 Pure hypercholesterolemia, unspecified: Secondary | ICD-10-CM

## 2019-03-08 MED ORDER — TRAMADOL HCL 50 MG PO TABS: 50.0000 mg | ORAL_TABLET | Freq: Three times a day (TID) | ORAL | 0 refills | Status: DC | PRN

## 2019-03-08 NOTE — Addendum Note (Signed)
Addended by: Carter Kitten on: 03/08/2019 10:24 AM   Modules accepted: Orders

## 2019-03-08 NOTE — Patient Instructions (Addendum)
Work on low cholesterol diet, increase exercise and weight loss.

## 2019-03-08 NOTE — Assessment & Plan Note (Signed)
Encouraged exercise, weight loss, healthy eating habits. ? ?

## 2019-03-08 NOTE — Assessment & Plan Note (Signed)
Followed by Dr. Estanislado Pandy. Minimal use of tramadol for pain.

## 2019-03-08 NOTE — Progress Notes (Signed)
Chief Complaint  Patient presents with  . Annual Exam    History of Present Illness: HPI   The patient is here for annual wellness exam and preventative care.    Hypothyroid:  Stable on levothyroxine Lab Results  Component Value Date   TSH 0.64 02/22/2019    Prediabetes:  Lab Results  Component Value Date   HGBA1C 5.4 02/22/2019    Elevated Cholesterol:  Worsened control but statin not yet indicated per 10 year risk calc Lab Results  Component Value Date   CHOL 207 (H) 02/22/2019   HDL 63.10 02/22/2019   LDLCALC 124 (H) 02/22/2019   LDLDIRECT 131.3 11/25/2011   TRIG 102.0 02/22/2019   CHOLHDL 3 02/22/2019  Using medications without problems: Muscle aches:  Diet compliance: moderate Exercise:  walking Other complaints: Body mass index is 39.18 kg/m. Wt Readings from Last 3 Encounters:  03/08/19 228 lb 4 oz (103.5 kg)  11/11/18 227 lb (103 kg)  06/10/18 226 lb (102.5 kg)    Rheumatoid Arthritis: on methotrexate and humira (increase back to q2 weeks) with moderate control.She has had progression of RA.She has had improvement with turmeric. Followed by Estanislado Pandy. Requests refill of tramadol to use for chronic pain... Use  < 1 a day to have available as needed. PDMP reviewed.   This visit occurred during the SARS-CoV-2 public health emergency.  Safety protocols were in place, including screening questions prior to the visit, additional usage of staff PPE, and extensive cleaning of exam room while observing appropriate contact time as indicated for disinfecting solutions.   COVID 19 screen:  No recent travel or known exposure to COVID19 The patient denies respiratory symptoms of COVID 19 at this time. The importance of social distancing was discussed today.     ROS    Past Medical History:  Diagnosis Date  . Allergic rhinitis due to pollen   . Collagen vascular disease ([place])   . Rheumatoid arthritis(714.0)   . Unspecified hypothyroidism     reports  that she has never smoked. She has never used smokeless tobacco. She reports current alcohol use of about 2.0 standard drinks of alcohol per week. She reports that she does not use drugs.   Current Outpatient Medications:  .  Black Cohosh 40 MG CAPS, Take 1 capsule by mouth daily., Disp: , Rfl:  .  Cholecalciferol (VITAMIN D-3) 1000 units CAPS, Take 1 capsule by mouth daily., Disp: , Rfl:  .  CRANBERRY PO, Take 4,200 mg by mouth daily., Disp: , Rfl:  .  fish oil-omega-3 fatty acids 1000 MG capsule, Take 2 g by mouth daily., Disp: , Rfl:  .  folic acid (FOLVITE) 1 MG tablet, TAKE 1 TABLET BY MOUTH EVERY DAY, Disp: 90 tablet, Rfl: 3 .  HUMIRA PEN 40 MG/0.4ML PNKT, INJECT 1 PEN UNDER THE SKIN EVERY 14 DAYS., Disp: 6 each, Rfl: 0 .  levothyroxine (SYNTHROID) 100 MCG tablet, TAKE ONE TABLET BY MOUTH EVERY DAY, Disp: 90 tablet, Rfl: 0 .  methotrexate (RHEUMATREX) 2.5 MG tablet, TAKE 5 TABLETS BY MOUTH ONCE WEEKLY. CAUTION:CHEMOTHERAPY. PROTECT FROM LIGHT., Disp: 60 tablet, Rfl: 0 .  Multiple Vitamins-Minerals (MULTIVITAMIN ADULT PO), Take by mouth daily., Disp: , Rfl:  .  TURMERIC PO, Take 1,000 mg by mouth daily., Disp: , Rfl:  .  vitamin C (ASCORBIC ACID) 500 MG tablet, Take 500 mg by mouth daily.  , Disp: , Rfl:    Observations/Objective: Blood pressure 124/78, pulse 67, temperature 97.9 F (36.6 C), temperature source Temporal,  height 5\' 4"  (1.626 m), weight 228 lb 4 oz (103.5 kg), SpO2 95 %.  Physical Exam Constitutional:      General: She is not in acute distress.    Appearance: Normal appearance. She is well-developed. She is not ill-appearing or toxic-appearing.  HENT:     Head: Normocephalic.     Right Ear: Hearing, tympanic membrane, ear canal and external ear normal.     Left Ear: Hearing, tympanic membrane, ear canal and external ear normal.     Nose: Nose normal.  Eyes:     General: Lids are normal. Lids are everted, no foreign bodies appreciated.     Conjunctiva/sclera:  Conjunctivae normal.     Pupils: Pupils are equal, round, and reactive to light.  Neck:     Musculoskeletal: Normal range of motion and neck supple.     Thyroid: No thyroid mass or thyromegaly.     Vascular: No carotid bruit.     Trachea: Trachea normal.  Cardiovascular:     Rate and Rhythm: Normal rate and regular rhythm.     Heart sounds: Normal heart sounds, S1 normal and S2 normal. No murmur. No gallop.   Pulmonary:     Effort: Pulmonary effort is normal. No respiratory distress.     Breath sounds: Normal breath sounds. No wheezing, rhonchi or rales.  Abdominal:     General: Bowel sounds are normal. There is no distension or abdominal bruit.     Palpations: Abdomen is soft. There is no fluid wave or mass.     Tenderness: There is no abdominal tenderness. There is no guarding or rebound.     Hernia: No hernia is present.  Genitourinary:    Exam position: Supine.     Labia:        Right: No rash, tenderness or lesion.        Left: No rash, tenderness or lesion.      Vagina: Normal.     Cervix: No cervical motion tenderness, discharge or friability.     Uterus: Not enlarged and not tender.      Adnexa:        Right: No mass, tenderness or fullness.         Left: No mass, tenderness or fullness.    Lymphadenopathy:     Cervical: No cervical adenopathy.  Skin:    General: Skin is warm and dry.     Findings: No rash.  Neurological:     Mental Status: She is alert.     Cranial Nerves: No cranial nerve deficit.     Sensory: No sensory deficit.  Psychiatric:        Mood and Affect: Mood is not anxious or depressed.        Speech: Speech normal.        Behavior: Behavior normal. Behavior is cooperative.        Judgment: Judgment normal.      Assessment and Plan The patient's preventative maintenance and recommended screening tests for an annual wellness exam were reviewed in full today. Brought up to date unless services declined.  Counselled on the importance of diet,  exercise, and its role in overall health and mortality. The patient's FH and SH was reviewed, including their home life, tobacco status, and drug and alcohol status.   Vaccines:flu, PCV 13uptodate, [id number] and PCV 23 in 5 years. Nonsmoker.  PAP/DVE: Q 5year, last done 06/2013 nml , no HPV, DVE not indicated.. Asymptomatic. PAP due today Mammo:03/2018, q2 yearsno family  history for breast cancer.. Colon: 01/2012 Dr. Carlean Purl, nml repeat in 10 years STD screening:refused Hep C : done   Hypothyroidism Encouraged exercise, weight loss, healthy eating habits.   Prediabetes  Stable control.  Pure hypercholesterolemia Worsened control but statin not yet indicated per 10 year risk calc  Rheumatoid arthritis ([place])  Followed by Dr. Estanislado Pandy. Minimal use of tramadol for pain.    Eliezer Lofts, MD

## 2019-03-08 NOTE — Assessment & Plan Note (Signed)
Worsened control but statin not yet indicated per 10 year risk calc

## 2019-03-08 NOTE — Assessment & Plan Note (Signed)
Stable control. 

## 2019-03-09 LAB — CYTOLOGY - PAP
Comment: NEGATIVE
Diagnosis: NEGATIVE
High risk HPV: NEGATIVE

## 2019-04-11 NOTE — Progress Notes (Signed)
Virtual Visit via Video Note  I connected with Amy Hart on 04/11/19 at  2:00 PM EST by a video enabled telemedicine application and verified that I am speaking with the correct person using two identifiers.  Location: Patient: Home  Provider: Clinic  This service was conducted via virtual visit.  Both audio and visual tools were used.  The patient was located at home. I was located in my office.  Consent was obtained prior to the virtual visit and is aware of possible charges through their insurance for this visit.  The patient is an established patient.  Dr. Estanislado Pandy, MD conducted the virtual visit and Hazel Sams, PA-C acted as scribe during the service.  Office staff helped with scheduling follow up visits after the service was conducted.   I discussed the limitations of evaluation and management by telemedicine and the availability of in person appointments. The patient expressed understanding and agreed to proceed.  CC: Pain in bilateral 5th PIP joints  History of Present Illness: Amy Hart is a 59 y.o. female with history of rheumatoid arthritis and osteoarthritis overlap.  She is on Humira 40 mg sq injections every 14 days and MTX 5 tablets by mouth once weekly.  She is tolerating both medications without any side effects.  She has not missed any doses recently.  She continues to have pain in bilateral 5th PIP joints. She had cortisone injections bilaterally on 06/10/18, which provided temporary relief. She denies any other joint pain or joint swelling currently.    Review of Systems  Constitutional: Negative for fever and malaise/fatigue.  HENT: Negative for congestion.   Eyes: Negative for photophobia, pain, discharge and redness.  Respiratory: Negative for cough, shortness of breath and wheezing.   Cardiovascular: Negative for chest pain, palpitations and leg swelling.  Gastrointestinal: Negative for blood in stool, constipation and diarrhea.  Genitourinary: Negative for  dysuria and frequency.  Musculoskeletal: Positive for joint pain. Negative for back pain, myalgias and neck pain.  Skin: Negative for rash.  Neurological: Negative for dizziness, weakness and headaches.  Endo/Heme/Allergies: Does not bruise/bleed easily.  Psychiatric/Behavioral: Negative for depression and memory loss. The patient is not nervous/anxious and does not have insomnia.      Observations/Objective: Physical Exam  Constitutional: She is oriented to person, place, and time and well-developed, well-nourished, and in no distress.  HENT:  Head: Normocephalic and atraumatic.  Eyes: Conjunctivae are normal.  Pulmonary/Chest: Effort normal.  Neurological: She is alert and oriented to person, place, and time.  Psychiatric: Mood, memory, affect and judgment normal.   Patient reports morning stiffness for 20 minutes.   Patient reports nocturnal pain.  Difficulty dressing/grooming: Denies Difficulty climbing stairs: Denies Difficulty getting out of chair: Denies Difficulty using hands for taps, buttons, cutlery, and/or writing: Reports   Assessment and Plan: Visit Diagnoses: Rheumatoid arthritis involving multiple sites with positive rheumatoid factor ([place]) -She has not had any recent rheumatoid arthritis flares.  She has persistent tenderness in bilateral 5th PIP joints. She had cortisone injections in bilateral 5th PIP joints on 06/10/18, which provided temporary relief. She has no other joint pain or joint swelling currently.  She has morning stiffness lasting 20 minutes daily.  She is overall clinically doing well on Humira 40 mg sq injections every 14 days, MTX 5 tablets po once weekly, and folic acid 1 mg po daily.  She has not missed any doses and is tolerating both medications without any side effects.  She will continue on the current  treatment regimen.  She was advised to notify us if she develops increased joint pain or joint swelling.  She will follow up in 4 months.    High  risk medication use - Humira 40 mg every 14 days, methotrexate 2.5 mg 5 tablets every 7 days, folic acid 1 mg 1 tablet daily.  Last TB gold negative on 02/03/2019.  CBC and CMP were drawn on 02/03/19.   Primary osteoarthritis of both hands-She has ongoing pain in bilateral 5th PIP joints.  She had a cortisone injection on 06/10/18, which provided pain relief temporarily.    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 Unchanged.   Other fatigue: She has no fatigue at this time.   Other medical conditions are listed as follows:   History of hypothyroidism   Prediabetes  History of kidney stones   History of hematuria   Follow Up Instructions: She will follow up 4 months.    I discussed the assessment and treatment plan with the patient. The patient was provided an opportunity to ask questions and all were answered. The patient agreed with the plan and demonstrated an understanding of the instructions.   The patient was advised to call back or seek an in-person evaluation if the symptoms worsen or if the condition fails to improve as anticipated.  I provided 15 minutes of non-face-to-face time during this encounter.  Bo Merino, MD   Scribed by-  Hazel Sams, PA-C

## 2019-04-14 ENCOUNTER — Other Ambulatory Visit: Payer: Self-pay

## 2019-04-14 ENCOUNTER — Telehealth (INDEPENDENT_AMBULATORY_CARE_PROVIDER_SITE_OTHER): Payer: BLUE CROSS/BLUE SHIELD | Admitting: Rheumatology

## 2019-04-14 ENCOUNTER — Encounter: Payer: Self-pay | Admitting: Rheumatology

## 2019-04-14 DIAGNOSIS — M0579 Rheumatoid arthritis with rheumatoid factor of multiple sites without organ or systems involvement: Secondary | ICD-10-CM

## 2019-04-14 DIAGNOSIS — M24521 Contracture, right elbow: Secondary | ICD-10-CM

## 2019-04-14 DIAGNOSIS — Z79899 Other long term (current) drug therapy: Secondary | ICD-10-CM | POA: Diagnosis not present

## 2019-04-14 DIAGNOSIS — Z87442 Personal history of urinary calculi: Secondary | ICD-10-CM

## 2019-04-14 DIAGNOSIS — M19041 Primary osteoarthritis, right hand: Secondary | ICD-10-CM | POA: Diagnosis not present

## 2019-04-14 DIAGNOSIS — Z87448 Personal history of other diseases of urinary system: Secondary | ICD-10-CM

## 2019-04-14 DIAGNOSIS — R5383 Other fatigue: Secondary | ICD-10-CM

## 2019-04-14 DIAGNOSIS — R7303 Prediabetes: Secondary | ICD-10-CM

## 2019-04-14 DIAGNOSIS — M19042 Primary osteoarthritis, left hand: Secondary | ICD-10-CM

## 2019-04-14 DIAGNOSIS — Z8639 Personal history of other endocrine, nutritional and metabolic disease: Secondary | ICD-10-CM

## 2019-04-28 ENCOUNTER — Other Ambulatory Visit: Payer: Self-pay | Admitting: Family Medicine

## 2019-05-07 ENCOUNTER — Other Ambulatory Visit: Payer: Self-pay | Admitting: Rheumatology

## 2019-05-07 DIAGNOSIS — Z79899 Other long term (current) drug therapy: Secondary | ICD-10-CM

## 2019-05-09 NOTE — Telephone Encounter (Addendum)
Last Visit: 04/14/19 Next Visit: 08/18/19 Labs: 02/03/19 CMP WNL. MCH is borderline elevated but trending down. Rest of CBC WNL  Patient advised she is due to update labs. Patient will update this week.  Okay to refill 30 day supply per Dr. Estanislado Pandy

## 2019-05-10 ENCOUNTER — Ambulatory Visit: Payer: BC Managed Care – PPO | Admitting: Family Medicine

## 2019-05-10 ENCOUNTER — Other Ambulatory Visit: Payer: Self-pay

## 2019-05-10 ENCOUNTER — Ambulatory Visit (INDEPENDENT_AMBULATORY_CARE_PROVIDER_SITE_OTHER): Payer: BC Managed Care – PPO | Admitting: *Deleted

## 2019-05-10 DIAGNOSIS — Z23 Encounter for immunization: Secondary | ICD-10-CM

## 2019-05-12 ENCOUNTER — Other Ambulatory Visit: Payer: Self-pay

## 2019-05-12 DIAGNOSIS — Z79899 Other long term (current) drug therapy: Secondary | ICD-10-CM | POA: Diagnosis not present

## 2019-05-13 LAB — COMPLETE METABOLIC PANEL WITH GFR
AG Ratio: 1.5 (calc) (ref 1.0–2.5)
ALT: 16 U/L (ref 6–29)
AST: 16 U/L (ref 10–35)
Albumin: 4 g/dL (ref 3.6–5.1)
Alkaline phosphatase (APISO): 83 U/L (ref 37–153)
BUN: 13 mg/dL (ref 7–25)
CO2: 26 mmol/L (ref 20–32)
Calcium: 9.2 mg/dL (ref 8.6–10.4)
Chloride: 106 mmol/L (ref 98–110)
Creat: 0.72 mg/dL (ref 0.50–1.05)
GFR, Est African American: 107 mL/min/{1.73_m2} (ref >=60)
GFR, Est Non African American: 92 mL/min/{1.73_m2} (ref >=60)
Globulin: 2.7 g/dL (calc) (ref 1.9–3.7)
Glucose, Bld: 141 mg/dL — ABNORMAL HIGH (ref 65–99)
Potassium: 4.3 mmol/L (ref 3.5–5.3)
Sodium: 141 mmol/L (ref 135–146)
Total Bilirubin: 0.5 mg/dL (ref 0.2–1.2)
Total Protein: 6.7 g/dL (ref 6.1–8.1)

## 2019-05-13 LAB — CBC WITH DIFFERENTIAL/PLATELET
Absolute Monocytes: 537 cells/uL (ref 200–950)
Basophils Absolute: 31 cells/uL (ref 0–200)
Basophils Relative: 0.7 %
Eosinophils Absolute: 141 cells/uL (ref 15–500)
Eosinophils Relative: 3.2 %
HCT: 41.9 % (ref 35.0–45.0)
Hemoglobin: 14.5 g/dL (ref 11.7–15.5)
Lymphs Abs: 1245 cells/uL (ref 850–3900)
MCH: 34.3 pg — ABNORMAL HIGH (ref 27.0–33.0)
MCHC: 34.6 g/dL (ref 32.0–36.0)
MCV: 99.1 fL (ref 80.0–100.0)
MPV: 12.2 fL (ref 7.5–12.5)
Monocytes Relative: 12.2 %
Neutro Abs: 2446 cells/uL (ref 1500–7800)
Neutrophils Relative %: 55.6 %
Platelets: 159 10*3/uL (ref 140–400)
RBC: 4.23 10*6/uL (ref 3.80–5.10)
RDW: 13.1 % (ref 11.0–15.0)
Total Lymphocyte: 28.3 %
WBC: 4.4 10*3/uL (ref 3.8–10.8)

## 2019-05-13 NOTE — Progress Notes (Signed)
CBC is normal.  Glucose is elevated.  Probably not fasting.  Please notify patient and forward the labs to her PCP.

## 2019-05-19 ENCOUNTER — Telehealth: Payer: Self-pay | Admitting: Rheumatology

## 2019-05-19 NOTE — Telephone Encounter (Signed)
Submitted a Prior Authorization request to [facility] for HUMIRA via Cover My Meds. Will update once we receive a response.

## 2019-05-19 NOTE — Telephone Encounter (Signed)
[facility] called stating patient presented them with a new insurance ID number for pharmacy.  An authorization is now required before they will be able to refill prescription of Humira.  Benefits Department [phone]

## 2019-05-23 NOTE — Telephone Encounter (Signed)
Received notification from [facility] regarding a prior authorization for [place]. Authorization has been APPROVED from 05/19/19 to 05/17/22.   Will send document to scan center.  Authorization # [id number]  Called patient to advise.   8:51 AM Beatriz Chancellor, CPhT

## 2019-06-01 ENCOUNTER — Other Ambulatory Visit: Payer: Self-pay | Admitting: Rheumatology

## 2019-06-01 NOTE — Telephone Encounter (Signed)
Last Visit: 04/14/19 Next Visit: 08/18/19 Labs: 05/12/19 CBC is normal. Glucose is elevated.  Okay to refill per Dr. Estanislado Pandy

## 2019-06-16 ENCOUNTER — Ambulatory Visit: Payer: BC Managed Care – PPO

## 2019-06-17 ENCOUNTER — Ambulatory Visit: Payer: BC Managed Care – PPO | Attending: Internal Medicine

## 2019-06-17 DIAGNOSIS — Z23 Encounter for immunization: Secondary | ICD-10-CM

## 2019-06-17 NOTE — Progress Notes (Signed)
   Covid-19 Vaccination Clinic  Name:  Amy Hart    MRN: [id number] DOB: [birth year]  06/17/2019  [name] was observed post Covid-19 immunization for 15 minutes without incident. She was provided with Vaccine Information Sheet and instruction to access the V-Safe system.   [name] was instructed to call 911 with any severe reactions post vaccine: Marland Kitchen Difficulty breathing  . Swelling of face and throat  . A fast heartbeat  . A bad rash all over body  . Dizziness and weakness   Immunizations Administered    Name Date Dose VIS Date Route   Pfizer COVID-19 Vaccine 06/17/2019 11:54 AM 0.3 mL 03/18/2019 Intramuscular   Manufacturer: [place]   Lot: [id number]   [place]: [id number]

## 2019-06-27 ENCOUNTER — Other Ambulatory Visit: Payer: Self-pay | Admitting: Rheumatology

## 2019-06-27 NOTE — Telephone Encounter (Signed)
Ok to refill Humira

## 2019-06-27 NOTE — Telephone Encounter (Signed)
Last Visit:04/14/19 Next Visit:08/18/19 Labs: 05/12/19 CBC is normal. Glucose is elevated. Tb Gold: 05/12/19  Current Dose per office note on Humira 40 mg every 14 days  We have not refilled Humira 12/14/2018.   Okay to refill Humira?

## 2019-07-12 ENCOUNTER — Ambulatory Visit: Payer: BC Managed Care – PPO | Attending: Internal Medicine

## 2019-07-12 ENCOUNTER — Other Ambulatory Visit: Payer: Self-pay | Admitting: Rheumatology

## 2019-07-12 DIAGNOSIS — Z23 Encounter for immunization: Secondary | ICD-10-CM

## 2019-07-12 NOTE — Progress Notes (Signed)
   Covid-19 Vaccination Clinic  Name:  Amy Hart    MRN: [id number] DOB: 09-23-1960  07/12/2019  Amy Hart was observed post Covid-19 immunization for 15 minutes without incident. She was provided with Vaccine Information Sheet and instruction to access the V-Safe system.   Amy Hart was instructed to call 911 with any severe reactions post vaccine: Marland Kitchen Difficulty breathing  . Swelling of face and throat  . A fast heartbeat  . A bad rash all over body  . Dizziness and weakness   Immunizations Administered    Name Date Dose VIS Date Route   Pfizer COVID-19 Vaccine 07/12/2019  2:55 PM 0.3 mL 03/18/2019 Intramuscular   Manufacturer: [place]   Lot: [id number]   [place]: [id number]

## 2019-08-11 NOTE — Progress Notes (Signed)
Office Visit Note  Patient: Amy Hart             Date of Birth: 1961-01-28           MRN: [id number]             PCP: Jinny Sanders, MD Referring: Jinny Sanders, MD Visit Date: 08/18/2019 Occupation: @GUAROCC @  Subjective:  Pain in both hands   History of Present Illness: Amy Hart is a 59 y.o. female with history of seropositive rheumatoid arthritis and osteoarthritis.  Patient is on Humira 40 mg subcu injections every 14 days, methotrexate 5 tablets by mouth once weekly, and folic acid 1 mg by mouth daily.  She continues to have chronic pain and stiffness in both hands.  Patient reports that she met up with her friend who is an occupational therapist who showed her several exercises to start performing.  She has been using hand putty which has been improving her stiffness.  She states that she may have overdone it and is having increased discomfort in both hands today.  She continues to have swelling in both fifth PIP joints.  She denies any other joint pain or joint swelling at this time.  She denies any recent infections.  She has received both COVID-19 vaccinations.  She continues to get yearly skin exams.   Activities of Daily Living:  Patient reports morning stiffness for 15 minutes.   Patient Reports nocturnal pain.  Difficulty dressing/grooming: Denies Difficulty climbing stairs: Denies Difficulty getting out of chair: Denies Difficulty using hands for taps, buttons, cutlery, and/or writing: Denies  Review of Systems  Constitutional: Negative for fatigue.  HENT: Negative for mouth sores, mouth dryness and nose dryness.   Eyes: Negative for pain, visual disturbance and dryness.  Respiratory: Negative for cough, hemoptysis, shortness of breath and difficulty breathing.   Cardiovascular: Negative for chest pain, palpitations, hypertension and swelling in legs/feet.  Gastrointestinal: Negative for blood in stool, constipation and diarrhea.  Endocrine: Negative for  increased urination.  Genitourinary: Negative for difficulty urinating and painful urination.  Musculoskeletal: Positive for arthralgias, joint pain, joint swelling, muscle weakness and morning stiffness. Negative for myalgias, muscle tenderness and myalgias.  Skin: Negative for color change, pallor, rash, hair loss, nodules/bumps, skin tightness, ulcers and sensitivity to sunlight.  Neurological: Negative for dizziness and headaches.  Hematological: Negative for bruising/bleeding tendency and swollen glands.  Psychiatric/Behavioral: Negative for depressed mood and sleep disturbance. The patient is not nervous/anxious.     PMFS History:  Patient Active Problem List   Diagnosis Date Noted  . Prediabetes 01/08/2017  . Obesity (BMI 35.0-39.9 without comorbidity) 12/07/2015  . Epistaxis, recurrent 12/05/2014  . Menopausal syndrome 12/05/2014  . Rheumatoid arthritis ([place]) 01/02/2012  . Pure hypercholesterolemia 08/29/2009  . 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 ROTATOR CUFF SYNDROME 08/04/2007  . Hypothyroidism 05/19/2007  . ALLERGIC RHINITIS, SEASONAL 05/19/2007    Past Medical History:  Diagnosis Date  . Allergic rhinitis due to pollen   . Collagen vascular disease ([place])   . Rheumatoid arthritis(714.0)   . Unspecified hypothyroidism     Family History  Problem Relation Age of Onset  . Hypertension Mother   . Thyroid disease Mother   . Nephrolithiasis Mother   . Diabetes Father   . Kidney Stones Sister    Past Surgical History:  Procedure Laterality Date  . BREAST BIOPSY  2003   right, negative  . ENDOMETRIAL ABLATION  2005  . FRACTURE SURGERY  01/01  right elbow  . ROTATOR CUFF REPAIR  06/2008   R. Cuff tear  . [place] BUNIONECTOMY     right  . TREATMENT FISTULA ANAL  1993   repair   Social History   Social History Narrative   No exercise   Diet: Moderate   She has a Sales promotion account executive   Lives with her partner   Immunization History  Administered Date(s)  Administered  . Influenza Inj Mdck Quad Pf 01/09/2018  . Influenza,inj,Quad PF,6+ Mos 12/07/2015, 01/09/2018, 01/19/2019  . Influenza-Unspecified 01/10/2015, 01/08/2017  . PFIZER SARS-COV-2 Vaccination 06/17/2019, 07/12/2019  . Pneumococcal Conjugate-13 12/07/2015  . Pneumococcal Polysaccharide-23 01/08/2017  . Td 02/05/1998, 07/13/2008  . Tdap 03/08/2019  . Zoster 09/09/2013  . Zoster Recombinat (Shingrix) 03/08/2019, 05/10/2019     Objective: Vital Signs: BP 103/67 (BP Location: Left Arm, Patient Position: Sitting, Cuff Size: Normal)   Pulse 78   Resp 18   Ht 5' 4"  (1.626 m)   Wt 230 lb 3.2 oz (104.4 kg)   BMI 39.51 kg/m    Physical Exam Vitals and nursing note reviewed.  Constitutional:      Appearance: She is well-developed.  HENT:     Head: Normocephalic and atraumatic.  Eyes:     Conjunctiva/sclera: Conjunctivae normal.  Pulmonary:     Effort: Pulmonary effort is normal.  Abdominal:     General: Bowel sounds are normal.     Palpations: Abdomen is soft.  Musculoskeletal:     Cervical back: Normal range of motion.  Lymphadenopathy:     Cervical: No cervical adenopathy.  Skin:    General: Skin is warm and dry.     Capillary Refill: Capillary refill takes less than 2 seconds.  Neurological:     Mental Status: She is alert and oriented to person, place, and time.  Psychiatric:        Behavior: Behavior normal.      Musculoskeletal Exam: C-spine, thoracic spine, and lumbar spine good ROM.  Shoulder joints good ROM with no discomfort.  Right elbow contracture.  Left elbow has good ROM with no discomfort.  Wrist joints good ROM with no tenderness or inflammation.  Contracture of both 5th PIP joints.  Tenderness and synovitis of the left 5th PIP joint.  Tenderness and synovial thickening of the right 1st and 2nd MCP joints.  Hip joints, knee joints, ankle joints have good range of motion with no discomfort.  No warmth or effusion of knee joints noted.  No tenderness or  inflammation of ankle joints.  CDAI Exam: CDAI Score: 5.9  Patient Global: 5 mm; Provider Global: 4 mm Swollen: 1 ; Tender: 4  Joint Exam 08/18/2019      Right  Left  MCP 1   Tender     MCP 2   Tender     PIP 5   Tender  Swollen Tender     Investigation: No additional findings.  Imaging: No results found.  Recent Labs: Lab Results  Component Value Date   WBC 4.4 05/12/2019   HGB 14.5 05/12/2019   PLT 159 05/12/2019   NA 141 05/12/2019   K 4.3 05/12/2019   CL 106 05/12/2019   CO2 26 05/12/2019   GLUCOSE 141 (H) 05/12/2019   BUN 13 05/12/2019   CREATININE 0.72 05/12/2019   BILITOT 0.5 05/12/2019   ALKPHOS 77 02/22/2019   AST 16 05/12/2019   ALT 16 05/12/2019   PROT 6.7 05/12/2019   ALBUMIN 4.1 02/22/2019   CALCIUM 9.2  05/12/2019   GFRAA 107 05/12/2019   QFTBGOLDPLUS NEGATIVE 02/03/2019    Speciality Comments: No specialty comments available.  Procedures:  No procedures performed Allergies: Naproxen   Assessment / Plan:     Visit Diagnoses: Rheumatoid arthritis involving multiple sites with positive rheumatoid factor ([place]): She has tenderness and synovitis of the left fifth PIP joint.  Contracture of bilateral fifth PIP joints noted.  She also has tenderness of the right first and second MCP joints with synovial thickening but no synovitis.  She has been performing hand exercises on a regular basis and using hand putty which has been helping with her hand stiffness.  She also uses Voltaren gel topically as needed for pain relief.  She is on Humira 40 mg subcutaneous injections every 14 days, methotrexate 5 tablets by mouth once weekly, folic acid 1 mg by mouth daily.  She is on the reduced dose of methotrexate due to history of elevated LFTs in 2019.  We discussed increasing the dose of methotrexate from 5 tablets to 6 tablets weekly.  She is in agreement.  If she continues to have persistent pain and inflammation we discussed switching to injectable methotrexate to  increase efficacy.  She will return for lab work in 1 month and every 3 months to monitor for drug toxicity.  She was advised to notify us if her joint pain persists or worsens.  She will follow-up in the office in 5 months.  High risk medication use - Humira 40 mg every 14 days, methotrexate 2.5 mg 6 tablets every 7 days, folic acid 1 mg 1 tablet daily.  Last TB gold negative on 02/03/2019.  CBC and CMP were drawn on 05/12/2019.  She is due to update lab work today.  Orders were released.  She will return for lab work in 1 month and every 3 months after increasing the dose of methotrexate from 5 tablets to 6 tablets weekly.  She has not had any recent infections.  She has received both COVID-19 vaccinations.  She continues to get yearly skin exams while on Humira.- Plan: CBC with Differential/Platelet, COMPLETE METABOLIC PANEL WITH GFR  Primary osteoarthritis of both hands: She has PIP and DIP thickening consistent with osteoarthritis of both hands.  She has tenderness and thickening of bilateral 5th PIP joints.  Inflammation noted in the left 5th PIP joint.  Joint protection and muscle strengthening were discussed.  She has been performing hand exercises and using hand putty, which has been helpful for her joint pain and stiffness.  She uses voltaren gel topically as needed for pain relief.   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 Unchanged.   Other fatigue: Chronic but stable.   Other medical conditions are listed as follows:   History of hypothyroidism  History of kidney stones  History of hematuria  Prediabetes  Orders: Orders Placed This Encounter  Procedures  . CBC with Differential/Platelet  . COMPLETE METABOLIC PANEL WITH GFR   No orders of the defined types were placed in this encounter.    Follow-Up Instructions: Return in about 5 months (around 01/18/2020) for Rheumatoid arthritis.   Amy Neas, PA-C  Note - This record has been created using  Dragon software.  Chart creation errors have been sought, but may not always  have been located. Such creation errors do not reflect on  the standard of medical care.

## 2019-08-17 ENCOUNTER — Other Ambulatory Visit: Payer: Self-pay | Admitting: Rheumatology

## 2019-08-17 NOTE — Telephone Encounter (Signed)
Ok to refill 30 day supply unless she would like to wait for labs to result on 08/19/19.

## 2019-08-17 NOTE — Telephone Encounter (Signed)
Last Visit:04/14/19 Next Visit:08/18/19 Labs: 05/12/19 CBC is normal. Glucose is elevated.  Patient to update labs at appointment on 08/18/2019.  Current Dose per office note on 04/14/2019: methotrexate 2.5 mg 5 tablets every 7 days  Okay to refill 30 day supply MTX?

## 2019-08-18 ENCOUNTER — Ambulatory Visit (INDEPENDENT_AMBULATORY_CARE_PROVIDER_SITE_OTHER): Payer: BC Managed Care – PPO | Admitting: Physician Assistant

## 2019-08-18 ENCOUNTER — Other Ambulatory Visit: Payer: Self-pay

## 2019-08-18 ENCOUNTER — Encounter: Payer: Self-pay | Admitting: Physician Assistant

## 2019-08-18 VITALS — BP 103/67 | HR 78 | Resp 18 | Ht 64.0 in | Wt 230.2 lb

## 2019-08-18 DIAGNOSIS — M19041 Primary osteoarthritis, right hand: Secondary | ICD-10-CM | POA: Diagnosis not present

## 2019-08-18 DIAGNOSIS — R5383 Other fatigue: Secondary | ICD-10-CM

## 2019-08-18 DIAGNOSIS — M24521 Contracture, right elbow: Secondary | ICD-10-CM | POA: Diagnosis not present

## 2019-08-18 DIAGNOSIS — Z8639 Personal history of other endocrine, nutritional and metabolic disease: Secondary | ICD-10-CM

## 2019-08-18 DIAGNOSIS — M0579 Rheumatoid arthritis with rheumatoid factor of multiple sites without organ or systems involvement: Secondary | ICD-10-CM | POA: Diagnosis not present

## 2019-08-18 DIAGNOSIS — Z79899 Other long term (current) drug therapy: Secondary | ICD-10-CM | POA: Diagnosis not present

## 2019-08-18 DIAGNOSIS — M19042 Primary osteoarthritis, left hand: Secondary | ICD-10-CM

## 2019-08-18 DIAGNOSIS — Z87448 Personal history of other diseases of urinary system: Secondary | ICD-10-CM

## 2019-08-18 DIAGNOSIS — Z87442 Personal history of urinary calculi: Secondary | ICD-10-CM

## 2019-08-18 DIAGNOSIS — R7303 Prediabetes: Secondary | ICD-10-CM

## 2019-08-18 MED ORDER — METHOTREXATE 2.5 MG PO TABS: ORAL_TABLET | ORAL | 0 refills | Status: DC

## 2019-08-18 NOTE — Patient Instructions (Addendum)
Standing Labs We placed an order today for your standing lab work.    Please come back and get your standing labs in 1 month and every 3 months   We have open lab daily Monday through Thursday from 8:30-12:30 PM and 1:30-4:30 PM and Friday from 8:30-12:30 PM and 1:30-4:00 PM at the office of Dr. Bo Merino.   You may experience shorter wait times on Monday and Friday afternoons. The office is located at [address], [place], [place], [place] [address] No appointment is necessary.   Labs are drawn by [facility].  You may receive a bill from [facility] for your lab work.  If you wish to have your labs drawn at another location, please call the office 24 hours in advance to send orders.  If you have any questions regarding directions or hours of operation,  please call [phone].   Just as a reminder please drink plenty of water prior to coming for your lab work. Thanks!

## 2019-08-19 ENCOUNTER — Telehealth: Payer: Self-pay | Admitting: *Deleted

## 2019-08-19 LAB — COMPLETE METABOLIC PANEL WITH GFR
AG Ratio: 1.6 (calc) (ref 1.0–2.5)
ALT: 16 U/L (ref 6–29)
AST: 19 U/L (ref 10–35)
Albumin: 4.3 g/dL (ref 3.6–5.1)
Alkaline phosphatase (APISO): 80 U/L (ref 37–153)
BUN: 21 mg/dL (ref 7–25)
CO2: 27 mmol/L (ref 20–32)
Calcium: 9.3 mg/dL (ref 8.6–10.4)
Chloride: 104 mmol/L (ref 98–110)
Creat: 0.89 mg/dL (ref 0.50–1.05)
GFR, Est African American: 83 mL/min/{1.73_m2} (ref >=60)
GFR, Est Non African American: 71 mL/min/{1.73_m2} (ref >=60)
Globulin: 2.7 g/dL (calc) (ref 1.9–3.7)
Glucose, Bld: 91 mg/dL (ref 65–99)
Potassium: 4.7 mmol/L (ref 3.5–5.3)
Sodium: 139 mmol/L (ref 135–146)
Total Bilirubin: 0.6 mg/dL (ref 0.2–1.2)
Total Protein: 7 g/dL (ref 6.1–8.1)

## 2019-08-19 LAB — CBC WITH DIFFERENTIAL/PLATELET
Absolute Monocytes: 678 cells/uL (ref 200–950)
Basophils Absolute: 22 cells/uL (ref 0–200)
Basophils Relative: 0.4 %
Eosinophils Absolute: 112 cells/uL (ref 15–500)
Eosinophils Relative: 2 %
HCT: 45.6 % — ABNORMAL HIGH (ref 35.0–45.0)
Hemoglobin: 15.4 g/dL (ref 11.7–15.5)
Lymphs Abs: 1630 cells/uL (ref 850–3900)
MCH: 34 pg — ABNORMAL HIGH (ref 27.0–33.0)
MCHC: 33.8 g/dL (ref 32.0–36.0)
MCV: 100.7 fL — ABNORMAL HIGH (ref 80.0–100.0)
MPV: 12.2 fL (ref 7.5–12.5)
Monocytes Relative: 12.1 %
Neutro Abs: 3158 cells/uL (ref 1500–7800)
Neutrophils Relative %: 56.4 %
Platelets: 181 10*3/uL (ref 140–400)
RBC: 4.53 10*6/uL (ref 3.80–5.10)
RDW: 13.1 % (ref 11.0–15.0)
Total Lymphocyte: 29.1 %
WBC: 5.6 10*3/uL (ref 3.8–10.8)

## 2019-08-19 MED ORDER — FOLIC ACID 1 MG PO TABS: 2.0000 mg | ORAL_TABLET | Freq: Every day | ORAL | 3 refills | Status: DC

## 2019-08-19 NOTE — Telephone Encounter (Signed)
-----   Message from Ofilia Neas, PA-C sent at 08/19/2019  8:05 AM EDT ----- Hct, MCV, and MCH are borderline elevated.  Please advise the patient to increase folic acid to 2 mg daily.  She will be increasing MTX to 6 tablets weekly.   Please advise the patient to return in 1 month to recheck lab work.    CMP WNL.

## 2019-08-19 NOTE — Telephone Encounter (Signed)
Patient advised of lab results and new prescription for folic acid sent to the pharmacy.

## 2019-08-19 NOTE — Progress Notes (Signed)
Hct, MCV, and MCH are borderline elevated.  Please advise the patient to increase folic acid to 2 mg daily.  She will be increasing MTX to 6 tablets weekly.   Please advise the patient to return in 1 month to recheck lab work.    CMP WNL.

## 2019-08-23 ENCOUNTER — Other Ambulatory Visit: Payer: Self-pay | Admitting: Rheumatology

## 2019-08-23 NOTE — Telephone Encounter (Signed)
Last Visit: 08/18/2019 Next Visit: 01/19/2020 Labs: 08/18/2019 Hct, MCV, and MCH are borderline elevated. Please advise the patient to increase folic acid to 2 mg daily. She will be increasing MTX to 6 tablets weekly.  Please advise the patient to return in 1 month to recheck lab work.  CMP WNL.   Okay to refill per Dr. Estanislado Pandy.

## 2019-09-12 ENCOUNTER — Other Ambulatory Visit: Payer: Self-pay | Admitting: Rheumatology

## 2019-09-15 ENCOUNTER — Other Ambulatory Visit: Payer: Self-pay

## 2019-09-15 DIAGNOSIS — Z79899 Other long term (current) drug therapy: Secondary | ICD-10-CM | POA: Diagnosis not present

## 2019-09-15 LAB — CBC WITH DIFFERENTIAL/PLATELET
Absolute Monocytes: 557 cells/uL (ref 200–950)
Basophils Absolute: 29 cells/uL (ref 0–200)
Basophils Relative: 0.6 %
Eosinophils Absolute: 110 cells/uL (ref 15–500)
Eosinophils Relative: 2.3 %
HCT: 44.5 % (ref 35.0–45.0)
Hemoglobin: 15.1 g/dL (ref 11.7–15.5)
Lymphs Abs: 1709 cells/uL (ref 850–3900)
MCH: 33.9 pg — ABNORMAL HIGH (ref 27.0–33.0)
MCHC: 33.9 g/dL (ref 32.0–36.0)
MCV: 100 fL (ref 80.0–100.0)
MPV: 12.5 fL (ref 7.5–12.5)
Monocytes Relative: 11.6 %
Neutro Abs: 2395 cells/uL (ref 1500–7800)
Neutrophils Relative %: 49.9 %
Platelets: 172 10*3/uL (ref 140–400)
RBC: 4.45 10*6/uL (ref 3.80–5.10)
RDW: 13.1 % (ref 11.0–15.0)
Total Lymphocyte: 35.6 %
WBC: 4.8 10*3/uL (ref 3.8–10.8)

## 2019-09-15 LAB — COMPLETE METABOLIC PANEL WITH GFR
AG Ratio: 1.8 (calc) (ref 1.0–2.5)
ALT: 19 U/L (ref 6–29)
AST: 20 U/L (ref 10–35)
Albumin: 4.4 g/dL (ref 3.6–5.1)
Alkaline phosphatase (APISO): 81 U/L (ref 37–153)
BUN/Creatinine Ratio: 20 (calc) (ref 6–22)
BUN: 21 mg/dL (ref 7–25)
CO2: 27 mmol/L (ref 20–32)
Calcium: 9.4 mg/dL (ref 8.6–10.4)
Chloride: 106 mmol/L (ref 98–110)
Creat: 1.07 mg/dL — ABNORMAL HIGH (ref 0.50–1.05)
GFR, Est African American: 66 mL/min/{1.73_m2} (ref >=60)
GFR, Est Non African American: 57 mL/min/{1.73_m2} — ABNORMAL LOW (ref >=60)
Globulin: 2.5 g/dL (calc) (ref 1.9–3.7)
Glucose, Bld: 100 mg/dL — ABNORMAL HIGH (ref 65–99)
Potassium: 5.1 mmol/L (ref 3.5–5.3)
Sodium: 141 mmol/L (ref 135–146)
Total Bilirubin: 0.5 mg/dL (ref 0.2–1.2)
Total Protein: 6.9 g/dL (ref 6.1–8.1)

## 2019-09-16 ENCOUNTER — Telehealth: Payer: Self-pay | Admitting: *Deleted

## 2019-09-16 DIAGNOSIS — Z79899 Other long term (current) drug therapy: Secondary | ICD-10-CM

## 2019-09-16 MED ORDER — METHOTREXATE 2.5 MG PO TABS
12.5000 mg | ORAL_TABLET | ORAL | 0 refills | Status: DC
Start: 2019-09-16 — End: 2019-11-28

## 2019-09-16 NOTE — Telephone Encounter (Signed)
-----   Message from Ofilia Neas, PA-C sent at 09/16/2019  8:00 AM EDT ----- Creatinine is borderline elevated-1.07.  GFR is mildly low-57. Please ask the patient if she has been taking NSAIDs.  Please advise the patient to reduce MTX to 5 tablets po once weekly and return to recheck CMP in 2 weeks.

## 2019-10-06 ENCOUNTER — Other Ambulatory Visit: Payer: Self-pay

## 2019-10-06 DIAGNOSIS — Z79899 Other long term (current) drug therapy: Secondary | ICD-10-CM | POA: Diagnosis not present

## 2019-10-06 LAB — CBC WITH DIFFERENTIAL/PLATELET
Absolute Monocytes: 437 cells/uL (ref 200–950)
Basophils Absolute: 38 cells/uL (ref 0–200)
Basophils Relative: 0.9 %
Eosinophils Absolute: 109 cells/uL (ref 15–500)
Eosinophils Relative: 2.6 %
HCT: 42.8 % (ref 35.0–45.0)
Hemoglobin: 14.6 g/dL (ref 11.7–15.5)
Lymphs Abs: 1470 cells/uL (ref 850–3900)
MCH: 34.1 pg — ABNORMAL HIGH (ref 27.0–33.0)
MCHC: 34.1 g/dL (ref 32.0–36.0)
MCV: 100 fL (ref 80.0–100.0)
MPV: 12.5 fL (ref 7.5–12.5)
Monocytes Relative: 10.4 %
Neutro Abs: 2146 cells/uL (ref 1500–7800)
Neutrophils Relative %: 51.1 %
Platelets: 175 10*3/uL (ref 140–400)
RBC: 4.28 10*6/uL (ref 3.80–5.10)
RDW: 13.1 % (ref 11.0–15.0)
Total Lymphocyte: 35 %
WBC: 4.2 10*3/uL (ref 3.8–10.8)

## 2019-10-06 LAB — COMPLETE METABOLIC PANEL WITH GFR
AG Ratio: 1.6 (calc) (ref 1.0–2.5)
ALT: 17 U/L (ref 6–29)
AST: 20 U/L (ref 10–35)
Albumin: 4.2 g/dL (ref 3.6–5.1)
Alkaline phosphatase (APISO): 85 U/L (ref 37–153)
BUN: 15 mg/dL (ref 7–25)
CO2: 28 mmol/L (ref 20–32)
Calcium: 9.5 mg/dL (ref 8.6–10.4)
Chloride: 105 mmol/L (ref 98–110)
Creat: 0.77 mg/dL (ref 0.50–1.05)
GFR, Est African American: 99 mL/min/{1.73_m2} (ref >=60)
GFR, Est Non African American: 85 mL/min/{1.73_m2} (ref >=60)
Globulin: 2.6 g/dL (calc) (ref 1.9–3.7)
Glucose, Bld: 103 mg/dL — ABNORMAL HIGH (ref 65–99)
Potassium: 5.3 mmol/L (ref 3.5–5.3)
Sodium: 140 mmol/L (ref 135–146)
Total Bilirubin: 0.7 mg/dL (ref 0.2–1.2)
Total Protein: 6.8 g/dL (ref 6.1–8.1)

## 2019-11-27 ENCOUNTER — Other Ambulatory Visit: Payer: Self-pay | Admitting: Rheumatology

## 2019-11-28 NOTE — Telephone Encounter (Signed)
Last Visit: 08/18/2019 Next Visit: 01/19/2020 Labs: 10/06/2019 WNL  Current Dose per office note on 08/18/2019: She will be increasing MTX to 6 tablets weekly.   Dx: Rheumatoid arthritis involving multiple sites with positive rheumatoid factor   Okay to refill per Dr. Estanislado Pandy

## 2019-12-05 ENCOUNTER — Other Ambulatory Visit: Payer: Self-pay | Admitting: Rheumatology

## 2019-12-06 NOTE — Telephone Encounter (Signed)
Last Visit:08/18/2019 Next Visit:01/19/2020 Labs:10/06/2019 WNL TB Gold: 02/03/2019 Neg   Current Dose per office note on 08/18/2019: Humira 40 mg every 14 days Dx: Rheumatoid arthritis involving multiple sites with positive rheumatoid factor   Okay to refill per Dr. Estanislado Pandy

## 2020-01-09 NOTE — Progress Notes (Signed)
Office Visit Note  Patient: Amy Hart             Date of Birth: 08-27-1960           MRN: [id number]             PCP: Jinny Sanders, MD Referring: Jinny Sanders, MD Visit Date: 01/19/2020 Occupation: @GUAROCC @  Subjective:  Right fifth PIP joint pain  History of Present Illness: Amy Hart is a 59 y.o. female with history of seropositive rheumatoid and osteoarthritis.  She is on Humira 40 mg sq injections every 14 days, MTX 5 tablets by mouth once weekly, and folic acid 2 mg po daily.  She denies any recent rheumatoid arthritis flares.  She has ongoing pain and stiffness in her right fifth PIP joint.  She states that at night she feels a throbbing sensation in the right fifth PIP joint.  She denies any other joint pain or joint swelling at this time. She denies any recent infections.  She has received both COVID-19 vaccinations and plans on receiving the third dose.    Activities of Daily Living:  Patient reports morning stiffness for 0 minutes.   Patient Reports nocturnal pain.  Difficulty dressing/grooming: Denies Difficulty climbing stairs: Denies Difficulty getting out of chair: Denies Difficulty using hands for taps, buttons, cutlery, and/or writing: Denies  Review of Systems  Constitutional: Negative for fatigue.  HENT: Negative for mouth sores, mouth dryness and nose dryness.   Eyes: Negative for pain, visual disturbance and dryness.  Respiratory: Negative for cough, hemoptysis, shortness of breath and difficulty breathing.   Cardiovascular: Negative for chest pain, palpitations, hypertension and swelling in legs/feet.  Gastrointestinal: Negative for blood in stool, constipation and diarrhea.  Endocrine: Negative for increased urination.  Genitourinary: Negative for difficulty urinating and painful urination.  Musculoskeletal: Positive for arthralgias and joint pain. Negative for joint swelling, myalgias, muscle weakness, morning stiffness, muscle tenderness and  myalgias.  Skin: Negative for color change, pallor, rash, hair loss, nodules/bumps, skin tightness, ulcers and sensitivity to sunlight.  Allergic/Immunologic: Negative for susceptible to infections.  Neurological: Negative for dizziness, numbness, headaches and weakness.  Hematological: Negative for swollen glands.  Psychiatric/Behavioral: Positive for sleep disturbance. Negative for depressed mood. The patient is not nervous/anxious.     PMFS History:  Patient Active Problem List   Diagnosis Date Noted  . Prediabetes 01/08/2017  . Obesity (BMI 35.0-39.9 without comorbidity) 12/07/2015  . Epistaxis, recurrent 12/05/2014  . Menopausal syndrome 12/05/2014  . Rheumatoid arthritis ([place]) 01/02/2012  . Pure hypercholesterolemia 08/29/2009  . 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 ROTATOR CUFF SYNDROME 08/04/2007  . Hypothyroidism 05/19/2007  . ALLERGIC RHINITIS, SEASONAL 05/19/2007    Past Medical History:  Diagnosis Date  . Allergic rhinitis due to pollen   . Collagen vascular disease ([place])   . Rheumatoid arthritis(714.0)   . Unspecified hypothyroidism     Family History  Problem Relation Age of Onset  . Hypertension Mother   . Thyroid disease Mother   . Nephrolithiasis Mother   . Diabetes Father   . Kidney Stones Sister    Past Surgical History:  Procedure Laterality Date  . BREAST BIOPSY  2003   right, negative  . ENDOMETRIAL ABLATION  2005  . FRACTURE SURGERY  01/01   right elbow  . ROTATOR CUFF REPAIR  06/2008   R. Cuff tear  . Mikhail Hallenbeck BUNIONECTOMY     right  . TREATMENT FISTULA ANAL  1993   repair  Social History   Social History Narrative   No exercise   Diet: Moderate   She has a Sales promotion account executive   Lives with her partner   Immunization History  Administered Date(s) Administered  . Influenza Inj Mdck Quad Pf 01/09/2018  . Influenza,inj,Quad PF,6+ Mos 12/07/2015, 01/09/2018, 01/19/2019  . Influenza-Unspecified 01/10/2015, 01/08/2017  . PFIZER SARS-COV-2  Vaccination 06/17/2019, 07/12/2019  . Pneumococcal Conjugate-13 12/07/2015  . Pneumococcal Polysaccharide-23 01/08/2017  . Td 02/05/1998, 07/13/2008  . Tdap 03/08/2019  . Zoster 09/09/2013  . Zoster Recombinat (Shingrix) 03/08/2019, 05/10/2019     Objective: Vital Signs: BP 122/77 (BP Location: Left Arm, Patient Position: Sitting, Cuff Size: Normal)   Pulse 64   Resp 17   Ht 5\' 4"  (1.626 m)   Wt 220 lb 12.8 oz (100.2 kg)   BMI 37.90 kg/m    Physical Exam Vitals and nursing note reviewed.  Constitutional:      Appearance: She is well-developed.  HENT:     Head: Normocephalic and atraumatic.  Eyes:     Conjunctiva/sclera: Conjunctivae normal.  Pulmonary:     Effort: Pulmonary effort is normal.  Abdominal:     Palpations: Abdomen is soft.  Musculoskeletal:     Cervical back: Normal range of motion.  Skin:    General: Skin is warm and dry.     Capillary Refill: Capillary refill takes less than 2 seconds.  Neurological:     Mental Status: She is alert and oriented to person, place, and time.  Psychiatric:        Behavior: Behavior normal.      Musculoskeletal Exam: C-spine, thoracic spine, and lumbar spine have good range of motion.  No midline spinal tenderness.  No SI joint tenderness.  Shoulder joints, elbow joints, wrist joints, MCPs, PIPs, DIPs have good range of motion with no synovitis.  She has thickening of bilateral fifth PIP joints on examination.  Tenderness of the right fifth PIP joint noted.  She has complete fist formation bilaterally.  Hip joints have good range of motion with no discomfort.  Knee joints have good range of motion with no warmth or effusion.  Ankle joints have good range of motion with no tenderness or inflammation.  CDAI Exam: CDAI Score: 1.7  Patient Global: 5 mm; Provider Global: 2 mm Swollen: 0 ; Tender: 1  Joint Exam 01/19/2020      Right  Left  PIP 5   Tender        Investigation: No additional findings.  Imaging: No results  found.  Recent Labs: Lab Results  Component Value Date   WBC 4.2 10/06/2019   HGB 14.6 10/06/2019   PLT 175 10/06/2019   NA 140 10/06/2019   K 5.3 10/06/2019   CL 105 10/06/2019   CO2 28 10/06/2019   GLUCOSE 103 (H) 10/06/2019   BUN 15 10/06/2019   CREATININE 0.77 10/06/2019   BILITOT 0.7 10/06/2019   ALKPHOS 77 02/22/2019   AST 20 10/06/2019   ALT 17 10/06/2019   PROT 6.8 10/06/2019   ALBUMIN 4.1 02/22/2019   CALCIUM 9.5 10/06/2019   GFRAA 99 10/06/2019   QFTBGOLDPLUS NEGATIVE 02/03/2019    Speciality Comments: No specialty comments available.  Procedures:  No procedures performed Allergies: Naproxen   Assessment / Plan:     Visit Diagnoses: Rheumatoid arthritis involving multiple sites with positive rheumatoid factor ([place]): She has no synovitis on exam.  She has tenderness of the right fifth PIP joint with a mild flexion contracture.  She has been experiencing a throbbing sensation in her right fifth PIP joint at night.  We discussed the use of a figure-of-eight ring or arthritis compression gloves.  She can also use Voltaren gel topically as needed for pain relief.  Overall she is clinically doing well on Humira 40 mg subcutaneous injections every 14 days, methotrexate 5 tablets by mouth once weekly, and folic acid 1 mg by mouth daily.  She has not missed any doses of Humira or methotrexate recently.  She is not experiencing any other joint pain or inflammation at this time.  She will continue on the current treatment regimen.  She does not need any refills at this time.  She will follow-up in the office in 5 months.  High risk medication use - Humira 40 mg sq injection every 14 days, methotrexate 2.5 mg 5 tablets every 7 days, folic acid 1 mg 1 tablet daily. - Plan: CBC with Differential/Platelet, COMPLETE METABOLIC PANEL WITH GFR, QuantiFERON-TB Gold Plus CBC and CMP were drawn on 10/06/2019.  She is due to update lab today.  Orders for CBC and CMP were released.  She will be  due to update lab work in January and every 3 months to monitor for drug toxicity.  Standing orders for CBC and CMP are in place.  TB gold negative on 02/03/2019.  Order for TB gold was released today. She has received both COVID-19 vaccinations and was encouraged to receive a third dose.  She was advised to hold Tylenol and NSAIDs 24 hours prior to the third dose.  She was advised to hold methotrexate for 1 week after receiving the third dose.  She was advised to notify us or PCP if she develops the COVID-19 infection in order to receive the monoclonal antibody infusion.  She voiced understanding. She was advised to hold Humira and methotrexate if she develops signs or symptoms of an infection and to resume once the infection has completely cleared.  Screening for tuberculosis -TB gold negative on 02/03/2019.  She is due to update TB Gold today.  Order was released.  Plan: QuantiFERON-TB Gold Plus  Primary osteoarthritis of both hands: She has mild osteoarthritic changes in both hands.  She has thickening of bilateral fifth PIP joints.  Tenderness of the right fifth PIP joint was noted on examination today.  She is able to make a complete fist bilaterally.  We discussed the importance of joint protection and muscle strengthening.  We discussed the use of arthritis compression gloves.    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 Unchanged.   Other fatigue: Stable.  Other medical conditions are listed as follows:  History of hypothyroidism  History of hematuria  History of kidney stones  Prediabetes    Orders: Orders Placed This Encounter  Procedures  . CBC with Differential/Platelet  . COMPLETE METABOLIC PANEL WITH GFR  . QuantiFERON-TB Gold Plus   No orders of the defined types were placed in this encounter.     Follow-Up Instructions: Return in about 5 months (around 06/18/2020) for Rheumatoid arthritis, Osteoarthritis.   Ofilia Neas, PA-C  Note - This  record has been created using Dragon software.  Chart creation errors have been sought, but may not always  have been located. Such creation errors do not reflect on  the standard of medical care.

## 2020-01-19 ENCOUNTER — Encounter: Payer: Self-pay | Admitting: Physician Assistant

## 2020-01-19 ENCOUNTER — Other Ambulatory Visit: Payer: Self-pay

## 2020-01-19 ENCOUNTER — Ambulatory Visit (INDEPENDENT_AMBULATORY_CARE_PROVIDER_SITE_OTHER): Payer: BC Managed Care – PPO | Admitting: Physician Assistant

## 2020-01-19 VITALS — BP 122/77 | HR 64 | Resp 17 | Ht 64.0 in | Wt 220.8 lb

## 2020-01-19 DIAGNOSIS — Z79899 Other long term (current) drug therapy: Secondary | ICD-10-CM | POA: Diagnosis not present

## 2020-01-19 DIAGNOSIS — M19041 Primary osteoarthritis, right hand: Secondary | ICD-10-CM | POA: Diagnosis not present

## 2020-01-19 DIAGNOSIS — R7303 Prediabetes: Secondary | ICD-10-CM

## 2020-01-19 DIAGNOSIS — M24521 Contracture, right elbow: Secondary | ICD-10-CM | POA: Diagnosis not present

## 2020-01-19 DIAGNOSIS — M19042 Primary osteoarthritis, left hand: Secondary | ICD-10-CM

## 2020-01-19 DIAGNOSIS — M0579 Rheumatoid arthritis with rheumatoid factor of multiple sites without organ or systems involvement: Secondary | ICD-10-CM | POA: Diagnosis not present

## 2020-01-19 DIAGNOSIS — Z111 Encounter for screening for respiratory tuberculosis: Secondary | ICD-10-CM

## 2020-01-19 DIAGNOSIS — Z87448 Personal history of other diseases of urinary system: Secondary | ICD-10-CM

## 2020-01-19 DIAGNOSIS — R5383 Other fatigue: Secondary | ICD-10-CM

## 2020-01-19 DIAGNOSIS — Z87442 Personal history of urinary calculi: Secondary | ICD-10-CM

## 2020-01-19 DIAGNOSIS — Z8639 Personal history of other endocrine, nutritional and metabolic disease: Secondary | ICD-10-CM

## 2020-01-19 NOTE — Patient Instructions (Addendum)
Standing Labs We placed an order today for your standing lab work.   Please have your standing labs drawn in January and every 3 months   If possible, please have your labs drawn 2 weeks prior to your appointment so that the provider can discuss your results at your appointment.  We have open lab daily Monday through Thursday from 8:30-12:30 PM and 1:30-4:30 PM and Friday from 8:30-12:30 PM and 1:30-4:00 PM at the office of Dr. Bo Merino, [facility].   Please be advised, patients with office appointments requiring lab work will take precedents over walk-in lab work.  If possible, please come for your lab work on Monday and Friday afternoons, as you may experience shorter wait times. The office is located at [address] [place], [place], [place] [address] No appointment is necessary.   Labs are drawn by [facility]. Please bring your co-pay at the time of your lab draw.  You may receive a bill from [facility] for your lab work.  If you wish to have your labs drawn at another location, please call the office 24 hours in advance to send orders.  If you have any questions regarding directions or hours of operation,  please call [phone].   As a reminder, please drink plenty of water prior to coming for your lab work. Thanks!    COVID-19 vaccine recommendations:   COVID-19 vaccine is recommended for everyone (unless you are allergic to a vaccine component), even if you are on a medication that suppresses your immune system.   If you are on Methotrexate, Cellcept (mycophenolate), Rinvoq, Morrie Sheldon, and Olumiant- hold the medication for 1 week after each vaccine. Hold Methotrexate for 2 weeks after the single dose COVID-19 vaccine.   If you are on Orencia subcutaneous injection - hold medication one week prior to and one week after the first COVID-19 vaccine dose (only).   If you are on Orencia IV infusions- time vaccination administration so that the first COVID-19  vaccination will occur four weeks after the infusion and postpone the subsequent infusion by one week.   If you are on Cyclophosphamide or Rituxan infusions please contact your doctor prior to receiving the COVID-19 vaccine.   Do not take Tylenol or any anti-inflammatory medications (NSAIDs) 24 hours prior to the COVID-19 vaccination.   There is no direct evidence about the efficacy of the COVID-19 vaccine in individuals who are on medications that suppress the immune system.   Even if you are fully vaccinated, and you are on any medications that suppress your immune system, please continue to wear a mask, maintain at least six feet social distance and practice hand hygiene.   If you develop a COVID-19 infection, please contact your PCP or our office to determine if you need antibody infusion.  The booster vaccine is now available for immunocompromised patients. It is advised that if you had Pfizer vaccine you should get Coca-Cola booster.  If you had a Moderna vaccine then you should get a Moderna booster. Johnson and Wynetta Emery does not have a booster vaccine at this time.  Please see the following web sites for updated information.   https://www.rheumatology.org/Portals/0/Files/COVID-19-Vaccination-Patient-Resources.pdf  https://www.rheumatology.org/About-Us/Newsroom/Press-Releases/ID/1159   Hand Exercises Hand exercises can be helpful for almost anyone. These exercises can strengthen the hands, improve flexibility and movement, and increase blood flow to the hands. These results can make work and daily tasks easier. Hand exercises can be especially helpful for people who have joint pain from arthritis or have nerve damage from overuse (  carpal tunnel syndrome). These exercises can also help people who have injured a hand. Exercises Most of these hand exercises are gentle stretching and motion exercises. It is usually safe to do them often throughout the day. Warming up your hands before exercise  may help to reduce stiffness. You can do this with gentle massage or by placing your hands in warm water for 10-15 minutes. It is normal to feel some stretching, pulling, tightness, or mild discomfort as you begin new exercises. This will gradually improve. Stop an exercise right away if you feel sudden, severe pain or your pain gets worse. Ask your health care provider which exercises are best for you. Knuckle bend or "claw" fist 1. Stand or sit with your arm, hand, and all five fingers pointed straight up. Make sure to keep your wrist straight during the exercise. 2. Gently bend your fingers down toward your palm until the tips of your fingers are touching the top of your palm. Keep your big knuckle straight and just bend the small knuckles in your fingers. 3. Hold this position for __________ seconds. 4. Straighten (extend) your fingers back to the starting position. Repeat this exercise 5-10 times with each hand. Full finger fist 1. Stand or sit with your arm, hand, and all five fingers pointed straight up. Make sure to keep your wrist straight during the exercise. 2. Gently bend your fingers into your palm until the tips of your fingers are touching the middle of your palm. 3. Hold this position for __________ seconds. 4. Extend your fingers back to the starting position, stretching every joint fully. Repeat this exercise 5-10 times with each hand. Straight fist 1. Stand or sit with your arm, hand, and all five fingers pointed straight up. Make sure to keep your wrist straight during the exercise. 2. Gently bend your fingers at the big knuckle, where your fingers meet your hand, and the middle knuckle. Keep the knuckle at the tips of your fingers straight and try to touch the bottom of your palm. 3. Hold this position for __________ seconds. 4. Extend your fingers back to the starting position, stretching every joint fully. Repeat this exercise 5-10 times with each hand. Tabletop 1. Stand  or sit with your arm, hand, and all five fingers pointed straight up. Make sure to keep your wrist straight during the exercise. 2. Gently bend your fingers at the big knuckle, where your fingers meet your hand, as far down as you can while keeping the small knuckles in your fingers straight. Think of forming a tabletop with your fingers. 3. Hold this position for __________ seconds. 4. Extend your fingers back to the starting position, stretching every joint fully. Repeat this exercise 5-10 times with each hand. Finger spread 1. Place your hand flat on a table with your palm facing down. Make sure your wrist stays straight as you do this exercise. 2. Spread your fingers and thumb apart from each other as far as you can until you feel a gentle stretch. Hold this position for __________ seconds. 3. Bring your fingers and thumb tight together again. Hold this position for __________ seconds. Repeat this exercise 5-10 times with each hand. Making circles 1. Stand or sit with your arm, hand, and all five fingers pointed straight up. Make sure to keep your wrist straight during the exercise. 2. Make a circle by touching the tip of your thumb to the tip of your index finger. 3. Hold for __________ seconds. Then open your hand wide.  4. Repeat this motion with your thumb and each finger on your hand. Repeat this exercise 5-10 times with each hand. Thumb motion 1. Sit with your forearm resting on a table and your wrist straight. Your thumb should be facing up toward the ceiling. Keep your fingers relaxed as you move your thumb. 2. Lift your thumb up as high as you can toward the ceiling. Hold for __________ seconds. 3. Bend your thumb across your palm as far as you can, reaching the tip of your thumb for the small finger (pinkie) side of your palm. Hold for __________ seconds. Repeat this exercise 5-10 times with each hand. Grip strengthening  1. Hold a stress ball or other soft ball in the middle of  your hand. 2. Slowly increase the pressure, squeezing the ball as much as you can without causing pain. Think of bringing the tips of your fingers into the middle of your palm. All of your finger joints should bend when doing this exercise. 3. Hold your squeeze for __________ seconds, then relax. Repeat this exercise 5-10 times with each hand. Contact a health care provider if:  Your hand pain or discomfort gets much worse when you do an exercise.  Your hand pain or discomfort does not improve within 2 hours after you exercise. If you have any of these problems, stop doing these exercises right away. Do not do them again unless your health care provider says that you can. Get help right away if:  You develop sudden, severe hand pain or swelling. If this happens, stop doing these exercises right away. Do not do them again unless your health care provider says that you can. This information is not intended to replace advice given to you by your health care provider. Make sure you discuss any questions you have with your health care provider. Document Revised: 07/15/2018 Document Reviewed: 03/25/2018 Elsevier Patient Education  [place].

## 2020-01-20 NOTE — Progress Notes (Signed)
MCV and MCH are elevated.  Please advise the patient to take folic acid 2 mg daily.  Rest of CBC WNL.   Glucose is 110.  Rest of CMP WNL.

## 2020-01-21 LAB — COMPLETE METABOLIC PANEL WITH GFR
AG Ratio: 1.6 (calc) (ref 1.0–2.5)
ALT: 20 U/L (ref 6–29)
AST: 22 U/L (ref 10–35)
Albumin: 4.2 g/dL (ref 3.6–5.1)
Alkaline phosphatase (APISO): 80 U/L (ref 37–153)
BUN: 18 mg/dL (ref 7–25)
CO2: 26 mmol/L (ref 20–32)
Calcium: 9.4 mg/dL (ref 8.6–10.4)
Chloride: 105 mmol/L (ref 98–110)
Creat: 0.75 mg/dL (ref 0.50–1.05)
GFR, Est African American: 102 mL/min/{1.73_m2} (ref >=60)
GFR, Est Non African American: 88 mL/min/{1.73_m2} (ref >=60)
Globulin: 2.7 g/dL (calc) (ref 1.9–3.7)
Glucose, Bld: 110 mg/dL — ABNORMAL HIGH (ref 65–99)
Potassium: 4.8 mmol/L (ref 3.5–5.3)
Sodium: 140 mmol/L (ref 135–146)
Total Bilirubin: 0.5 mg/dL (ref 0.2–1.2)
Total Protein: 6.9 g/dL (ref 6.1–8.1)

## 2020-01-21 LAB — CBC WITH DIFFERENTIAL/PLATELET
Absolute Monocytes: 447 cells/uL (ref 200–950)
Basophils Absolute: 21 cells/uL (ref 0–200)
Basophils Relative: 0.4 %
Eosinophils Absolute: 88 cells/uL (ref 15–500)
Eosinophils Relative: 1.7 %
HCT: 45.4 % — ABNORMAL HIGH (ref 35.0–45.0)
Hemoglobin: 15.3 g/dL (ref 11.7–15.5)
Lymphs Abs: 1648 cells/uL (ref 850–3900)
MCH: 33.8 pg — ABNORMAL HIGH (ref 27.0–33.0)
MCHC: 33.7 g/dL (ref 32.0–36.0)
MCV: 100.2 fL — ABNORMAL HIGH (ref 80.0–100.0)
MPV: 12.9 fL — ABNORMAL HIGH (ref 7.5–12.5)
Monocytes Relative: 8.6 %
Neutro Abs: 2995 cells/uL (ref 1500–7800)
Neutrophils Relative %: 57.6 %
Platelets: 185 10*3/uL (ref 140–400)
RBC: 4.53 10*6/uL (ref 3.80–5.10)
RDW: 13.1 % (ref 11.0–15.0)
Total Lymphocyte: 31.7 %
WBC: 5.2 10*3/uL (ref 3.8–10.8)

## 2020-01-21 LAB — QUANTIFERON-TB GOLD PLUS
Mitogen-NIL: >10 IU/mL
NIL: 0.01 IU/mL
QuantiFERON-TB Gold Plus: NEGATIVE
TB1-NIL: 0.01 IU/mL
TB2-NIL: 0.01 IU/mL

## 2020-01-23 NOTE — Progress Notes (Signed)
TB gold negative

## 2020-02-15 ENCOUNTER — Other Ambulatory Visit: Payer: Self-pay | Admitting: Rheumatology

## 2020-02-15 NOTE — Telephone Encounter (Signed)
Last Visit: 01/19/2020 Next Visit: 06/21/2020 Labs: 01/19/2020 MCV and MCH are elevated. Rest of CBC WNL.  Glucose is 110. Rest of CMP WNL.   Current Dose per office note 01/19/2020: methotrexate 2.5 mg 5 tablets every 7 days DX: Rheumatoid arthritis involving multiple sites with positive rheumatoid factor   Okay to refill MTX?

## 2020-02-29 ENCOUNTER — Other Ambulatory Visit (INDEPENDENT_AMBULATORY_CARE_PROVIDER_SITE_OTHER): Payer: BC Managed Care – PPO

## 2020-02-29 ENCOUNTER — Other Ambulatory Visit: Payer: Self-pay

## 2020-02-29 DIAGNOSIS — R7303 Prediabetes: Secondary | ICD-10-CM | POA: Diagnosis not present

## 2020-02-29 DIAGNOSIS — E7800 Pure hypercholesterolemia, unspecified: Secondary | ICD-10-CM

## 2020-02-29 DIAGNOSIS — E039 Hypothyroidism, unspecified: Secondary | ICD-10-CM

## 2020-02-29 LAB — LIPID PANEL
Cholesterol: 183 mg/dL (ref 0–200)
HDL: 71.9 mg/dL (ref >39.00)
LDL Cholesterol: 95 mg/dL (ref 0–99)
NonHDL: 111.16
Total CHOL/HDL Ratio: 3
Triglycerides: 83 mg/dL (ref 0.0–149.0)
VLDL: 16.6 mg/dL (ref 0.0–40.0)

## 2020-02-29 LAB — COMPREHENSIVE METABOLIC PANEL
ALT: 17 U/L (ref 0–35)
AST: 19 U/L (ref 0–37)
Albumin: 4 g/dL (ref 3.5–5.2)
Alkaline Phosphatase: 80 U/L (ref 39–117)
BUN: 11 mg/dL (ref 6–23)
CO2: 29 mEq/L (ref 19–32)
Calcium: 9.5 mg/dL (ref 8.4–10.5)
Chloride: 103 mEq/L (ref 96–112)
Creatinine, Ser: 0.85 mg/dL (ref 0.40–1.20)
GFR: 75.28 mL/min (ref >60.00)
Glucose, Bld: 105 mg/dL — ABNORMAL HIGH (ref 70–99)
Potassium: 4.9 mEq/L (ref 3.5–5.1)
Sodium: 138 mEq/L (ref 135–145)
Total Bilirubin: 0.7 mg/dL (ref 0.2–1.2)
Total Protein: 6.7 g/dL (ref 6.0–8.3)

## 2020-02-29 LAB — HEMOGLOBIN A1C: Hgb A1c MFr Bld: 5.4 % (ref 4.6–6.5)

## 2020-02-29 LAB — T3, FREE: T3, Free: 3.1 pg/mL (ref 2.3–4.2)

## 2020-02-29 LAB — TSH: TSH: 1.44 u[IU]/mL (ref 0.35–4.50)

## 2020-02-29 LAB — T4, FREE: Free T4: 0.77 ng/dL (ref 0.60–1.60)

## 2020-02-29 NOTE — Progress Notes (Signed)
No critical labs need to be addressed urgently. We will discuss labs in detail at upcoming office visit.   

## 2020-03-09 ENCOUNTER — Other Ambulatory Visit: Payer: Self-pay

## 2020-03-09 ENCOUNTER — Encounter: Payer: Self-pay | Admitting: Family Medicine

## 2020-03-09 ENCOUNTER — Ambulatory Visit (INDEPENDENT_AMBULATORY_CARE_PROVIDER_SITE_OTHER): Payer: BC Managed Care – PPO | Admitting: Family Medicine

## 2020-03-09 VITALS — BP 110/72 | HR 76 | Temp 97.9°F | Ht 63.5 in | Wt 214.2 lb

## 2020-03-09 DIAGNOSIS — E039 Hypothyroidism, unspecified: Secondary | ICD-10-CM

## 2020-03-09 DIAGNOSIS — R7303 Prediabetes: Secondary | ICD-10-CM

## 2020-03-09 DIAGNOSIS — M0579 Rheumatoid arthritis with rheumatoid factor of multiple sites without organ or systems involvement: Secondary | ICD-10-CM | POA: Diagnosis not present

## 2020-03-09 DIAGNOSIS — E7800 Pure hypercholesterolemia, unspecified: Secondary | ICD-10-CM

## 2020-03-09 DIAGNOSIS — Z0000 Encounter for general adult medical examination without abnormal findings: Secondary | ICD-10-CM | POA: Diagnosis not present

## 2020-03-09 DIAGNOSIS — E669 Obesity, unspecified: Secondary | ICD-10-CM | POA: Insufficient documentation

## 2020-03-09 DIAGNOSIS — Z6837 Body mass index (BMI) 37.0-37.9, adult: Secondary | ICD-10-CM | POA: Diagnosis not present

## 2020-03-09 DIAGNOSIS — E66811 Obesity, class 1: Secondary | ICD-10-CM | POA: Insufficient documentation

## 2020-03-09 NOTE — Assessment & Plan Note (Signed)
Resolved with diet changes and weight loss.

## 2020-03-09 NOTE — Assessment & Plan Note (Signed)
10 year risk stiull below threshold for statin initiation.  Work on lifestyle changes, reviewed in detail.

## 2020-03-09 NOTE — Assessment & Plan Note (Signed)
Improving in last year.. 15 lb weight loss in last 6 months. Encouraged exercise, weight loss, healthy eating habits.

## 2020-03-09 NOTE — Patient Instructions (Addendum)
Work on healthy eating and regular exercise as able.  Please call the location of your choice from the menu below to schedule your Mammogram and/or Bone Density appointment.    [place]   1. [facility]                      Phone:  [phone] [address], [place] [address]                                                             Services: Traditional and 3D Mammogram, Bone Density   2. [facility]                 Phone: [phone] [address], [place] [address]    Service: Bone Density ONLY   *this site does NOT perform mammograms  3. [place]                        Phone:  [phone] [address] [place], [place] [address]                                            Services:  3D Mammogram and Bone Density    [place]  1. [place] [facility]   Phone:  [phone]   [place], [place] [address]                                            Services: 3D Mammogram and Bone Density  2. [place] [facility])  Phone:  [phone]   [address] [place], [place] [address]  Services:  3D Mammogram and Bone Density

## 2020-03-09 NOTE — Progress Notes (Signed)
Chief Complaint  Patient presents with  . Annual Exam    History of Present Illness: HPI The patient is here for annual wellness exam and preventative care.       Office Visit from 03/09/2020 in [facility] [place]  PHQ-2 Total Score 0      Hypothyroid:  Lab Results  Component Value Date   TSH 1.44 02/29/2020    Prediabetes:  Lab Results  Component Value Date   HGBA1C 5.4 02/29/2020      Elevated Cholesterol:  Lab Results  Component Value Date   CHOL 183 02/29/2020   HDL 71.90 02/29/2020   LDLCALC 95 02/29/2020   LDLDIRECT 131.3 11/25/2011   TRIG 83.0 02/29/2020   CHOLHDL 3 02/29/2020  Using medications without problems: Muscle aches:  Diet compliance: low carb diet.Marland Kitchen joined [employer] prediabetes program Exercise: walking 15 min daily Other complaints: The 10-year ASCVD risk score Mikey Bussing DC Brooke Bonito., et al., 2013) is: 1.5%   Values used to calculate the score:     Age: 29 years     Sex: Female     Is Non-Hispanic African American: No     Diabetic: No     Tobacco smoker: No     Systolic Blood Pressure: 388 mmHg     Is BP treated: No     HDL Cholesterol: 71.9 mg/dL     Total Cholesterol: 183 mg/dL   RA:  On methotrexate, Humira. Followed by Dr, Estanislado Pandy Rheum.   She is able to do yard work.   Obesity  Body mass index is 37.36 kg/m.  Wt Readings from Last 3 Encounters:  03/09/20 214 lb 4 oz (97.2 kg)  01/19/20 220 lb 12.8 oz (100.2 kg)  08/18/19 230 lb 3.2 oz (104.4 kg)     This visit occurred during the SARS-CoV-2 public health emergency.  Safety protocols were in place, including screening questions prior to the visit, additional usage of staff PPE, and extensive cleaning of exam room while observing appropriate contact time as indicated for disinfecting solutions.   COVID 19 screen:  No recent travel or known exposure to COVID19 The patient denies respiratory symptoms of COVID 19 at this time. The importance of social distancing was  discussed today.     Review of Systems  Constitutional: Negative for chills and fever.  HENT: Negative for congestion and ear pain.   Eyes: Negative for pain and redness.  Respiratory: Negative for cough and shortness of breath.   Cardiovascular: Negative for chest pain, palpitations and leg swelling.  Gastrointestinal: Negative for abdominal pain, blood in stool, constipation, diarrhea, nausea and vomiting.  Genitourinary: Negative for dysuria.  Musculoskeletal: Negative for falls and myalgias.  Skin: Negative for rash.  Neurological: Negative for dizziness.  Psychiatric/Behavioral: Negative for depression. The patient is not nervous/anxious.       Past Medical History:  Diagnosis Date  . Allergic rhinitis due to pollen   . Collagen vascular disease ([place])   . Rheumatoid arthritis(714.0)   . Unspecified hypothyroidism     reports that she has never smoked. She has never used smokeless tobacco. She reports current alcohol use of about 2.0 standard drinks of alcohol per week. She reports that she does not use drugs.   Current Outpatient Medications:  .  Black Cohosh 40 MG CAPS, Take 1 capsule by mouth daily., Disp: , Rfl:  .  Cholecalciferol (VITAMIN D-3) 1000 units CAPS, Take 1 capsule by mouth daily., Disp: , Rfl:  .  CRANBERRY PO, Take  4,200 mg by mouth daily., Disp: , Rfl:  .  folic acid (FOLVITE) 1 MG tablet, Take 2 tablets (2 mg total) by mouth daily., Disp: 180 tablet, Rfl: 3 .  HUMIRA PEN 40 MG/0.4ML PNKT, INJECT 1 PEN UNDER THE SKIN EVERY 14 DAYS., Disp: 6 each, Rfl: 0 .  levothyroxine (SYNTHROID) 100 MCG tablet, TAKE 1 TABLET BY MOUTH EVERY DAY, Disp: 90 tablet, Rfl: 3 .  methotrexate (RHEUMATREX) 2.5 MG tablet, TAKE 5 TABLETS BY MOUTH ONCE WEEKLY., Disp: 60 tablet, Rfl: 0 .  Multiple Vitamins-Minerals (MULTIVITAMIN ADULT PO), Take by mouth daily., Disp: , Rfl:  .  TURMERIC PO, Take 1,000 mg by mouth daily., Disp: , Rfl:  .  vitamin C (ASCORBIC ACID) 500 MG tablet, Take  500 mg by mouth daily.  , Disp: , Rfl:    Observations/Objective: Blood pressure 110/72, pulse 76, temperature 97.9 F (36.6 C), temperature source Temporal, height 5' 3.5" (1.613 m), weight 214 lb 4 oz (97.2 kg), SpO2 97 %.  Physical Exam Constitutional:      General: She is not in acute distress.    Appearance: Normal appearance. She is well-developed. She is obese. She is not ill-appearing or toxic-appearing.  HENT:     Head: Normocephalic.     Right Ear: Hearing, tympanic membrane, ear canal and external ear normal.     Left Ear: Hearing, tympanic membrane, ear canal and external ear normal.     Nose: Nose normal.  Eyes:     General: Lids are normal. Lids are everted, no foreign bodies appreciated.     Conjunctiva/sclera: Conjunctivae normal.     Pupils: Pupils are equal, round, and reactive to light.  Neck:     Thyroid: No thyroid mass or thyromegaly.     Vascular: No carotid bruit.     Trachea: Trachea normal.  Cardiovascular:     Rate and Rhythm: Normal rate and regular rhythm.     Heart sounds: Normal heart sounds, S1 normal and S2 normal. No murmur heard.  No gallop.   Pulmonary:     Effort: Pulmonary effort is normal. No respiratory distress.     Breath sounds: Normal breath sounds. No wheezing, rhonchi or rales.  Abdominal:     General: Bowel sounds are normal. There is no distension or abdominal bruit.     Palpations: Abdomen is soft. There is no fluid wave or mass.     Tenderness: There is no abdominal tenderness. There is no guarding or rebound.     Hernia: No hernia is present.  Musculoskeletal:     Cervical back: Normal range of motion and neck supple.  Lymphadenopathy:     Cervical: No cervical adenopathy.  Skin:    General: Skin is warm and dry.     Findings: No rash.  Neurological:     Mental Status: She is alert.     Cranial Nerves: No cranial nerve deficit.     Sensory: No sensory deficit.  Psychiatric:        Mood and Affect: Mood is not anxious or  depressed.        Speech: Speech normal.        Behavior: Behavior normal. Behavior is cooperative.        Judgment: Judgment normal.      Assessment and Plan   The patient's preventative maintenance and recommended screening tests for an annual wellness exam were reviewed in full today. Brought up to date unless services declined.  Counselled on the importance  of diet, exercise, and its role in overall health and mortality. The patient's FH and SH was reviewed, including their home life, tobacco status, and drug and alcohol status.    Vaccines:flu, PCV 13uptodate, [id number] and PCV 23 in 5 years, S/P COVID x 3  Nonsmoker.  PAP/DVE: Q 5year, last done 03/2019 nml , no HPV, DVE not indicated.. Asymptomatic.  Mammo:03/2018, q2 yearsno family history for breast cancer... due in Dec. Colon: 01/2012 Dr. Carlean Purl, nml repeat in 10 years STD screening:refused Hep C : done   Eliezer Lofts, MD

## 2020-03-09 NOTE — Assessment & Plan Note (Signed)
Encouraged exercise, weight loss, healthy eating habits. ? ?

## 2020-03-09 NOTE — Assessment & Plan Note (Signed)
Well controlled. Continue current medication.  

## 2020-03-09 NOTE — Assessment & Plan Note (Signed)
On methotrexate, Humira. Followed by Dr, Estanislado Pandy Rheum.

## 2020-03-13 MED ORDER — TRAMADOL HCL 50 MG PO TABS: 50.0000 mg | ORAL_TABLET | Freq: Three times a day (TID) | ORAL | 0 refills | Status: AC | PRN

## 2020-03-13 NOTE — Telephone Encounter (Signed)
PDMP reviewed.

## 2020-03-27 DIAGNOSIS — Z1231 Encounter for screening mammogram for malignant neoplasm of breast: Secondary | ICD-10-CM | POA: Diagnosis not present

## 2020-03-27 LAB — HM MAMMOGRAPHY

## 2020-03-28 ENCOUNTER — Encounter: Payer: Self-pay | Admitting: Family Medicine

## 2020-04-25 DIAGNOSIS — R7303 Prediabetes: Secondary | ICD-10-CM | POA: Diagnosis not present

## 2020-04-28 ENCOUNTER — Other Ambulatory Visit: Payer: Self-pay | Admitting: Family Medicine

## 2020-05-03 ENCOUNTER — Other Ambulatory Visit: Payer: Self-pay | Admitting: Physician Assistant

## 2020-05-03 NOTE — Telephone Encounter (Signed)
Last Visit: 01/19/2020 Next Visit: 06/21/2020 Labs: 01/19/2020, MCV and MCH are elevated. Please advise the patient to take folic acid 2 mg daily. Rest of CBC WNL.  Glucose is 110. Rest of CMP WNL.  Current Dose per office note 01/19/2020, methotrexate 2.5 mg 5 tablets every 7 days DX: Rheumatoid arthritis involving multiple sites with positive rheumatoid factor   Okay to refill MTX?

## 2020-06-05 ENCOUNTER — Other Ambulatory Visit: Payer: Self-pay | Admitting: Rheumatology

## 2020-06-05 NOTE — Telephone Encounter (Signed)
Last Visit: 01/19/2020 Next Visit: 06/21/2020 Labs: 01/19/2020, MCV and MCH are elevated. Please advise the patient to take folic acid 2 mg daily. Rest of CBC WNL.  Glucose is 110. Rest of CMP WNL. TB Gold: 01/19/2020, negative  Current Dose per office note 01/19/2020, Humira 40 mg sq injection every 14 days  DX: Rheumatoid arthritis involving multiple sites with positive rheumatoid factor   Last Fill: 12/06/2019  Okay to refill Humira?

## 2020-06-05 NOTE — Telephone Encounter (Signed)
Please advise the patient to update lab work.

## 2020-06-05 NOTE — Telephone Encounter (Signed)
I called patient, patient have labs drawn at 06/21/2020 appt.

## 2020-06-08 NOTE — Progress Notes (Signed)
Office Visit Note  Patient: Amy Hart             Date of Birth: [birth year]           MRN: [id number]             PCP: Jinny Sanders, MD Referring: Jinny Sanders, MD Visit Date: 06/21/2020 Occupation: @GUAROCC @  Subjective:  Medication management.   History of Present Illness: Amy Hart is a 60 y.o. female with a history of rheumatoid arthritis and osteoarthritis.  She continues to do well on Humira and methotrexate combination.  She denies any increased joint pain or joint swelling.  She states she has some stiffness in her hands occasionally.  The right elbow joint contracture stays the same.  Activities of Daily Living:  Patient reports morning stiffness for 0 minutes.   Patient Denies nocturnal pain.  Difficulty dressing/grooming: Denies Difficulty climbing stairs: Denies Difficulty getting out of chair: Denies Difficulty using hands for taps, buttons, cutlery, and/or writing: Denies  Review of Systems  Constitutional: Negative for fatigue, night sweats, weight gain and weight loss.  HENT: Negative for mouth sores, trouble swallowing, trouble swallowing, mouth dryness and nose dryness.   Eyes: Negative for pain, redness, itching, visual disturbance and dryness.  Respiratory: Negative for cough, shortness of breath and difficulty breathing.   Cardiovascular: Negative for chest pain, palpitations, hypertension, irregular heartbeat and swelling in legs/feet.  Gastrointestinal: Negative for blood in stool, constipation and diarrhea.  Endocrine: Negative for increased urination.  Genitourinary: Negative for difficulty urinating and vaginal dryness.  Musculoskeletal: Positive for myalgias and myalgias. Negative for arthralgias, joint pain, joint swelling, muscle weakness, morning stiffness and muscle tenderness.  Skin: Negative for color change, rash, hair loss, redness, skin tightness, ulcers and sensitivity to sunlight.  Allergic/Immunologic: Negative for susceptible  to infections.  Neurological: Positive for numbness. Negative for dizziness, headaches, memory loss, night sweats and weakness.  Hematological: Negative for bruising/bleeding tendency and swollen glands.  Psychiatric/Behavioral: Negative for depressed mood, confusion and sleep disturbance. The patient is not nervous/anxious.     PMFS History:  Patient Active Problem List   Diagnosis Date Noted  . BMI 37.0-37.9, adult 03/09/2020  . Prediabetes 01/08/2017  . Epistaxis, recurrent 12/05/2014  . Menopausal syndrome 12/05/2014  . Rheumatoid arthritis ([place]) 01/02/2012  . Pure hypercholesterolemia 08/29/2009  . 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 ROTATOR CUFF SYNDROME 08/04/2007  . Hypothyroidism 05/19/2007  . ALLERGIC RHINITIS, SEASONAL 05/19/2007    Past Medical History:  Diagnosis Date  . Allergic rhinitis due to pollen   . Collagen vascular disease ([place])   . Rheumatoid arthritis(714.0)   . Unspecified hypothyroidism     Family History  Problem Relation Age of Onset  . Hypertension Mother   . Thyroid disease Mother   . Nephrolithiasis Mother   . Diabetes Father   . Kidney Stones Sister    Past Surgical History:  Procedure Laterality Date  . BREAST BIOPSY  2003   right, negative  . ENDOMETRIAL ABLATION  2005  . FRACTURE SURGERY  01/01   right elbow  . ROTATOR CUFF REPAIR  06/2008   R. Cuff tear  . TAYLOR BUNIONECTOMY     right  . TREATMENT FISTULA ANAL  1993   repair   Social History   Social History Narrative   No exercise   Diet: Moderate   She has a Sales promotion account executive   Lives with her partner   Immunization History  Administered Date(s) Administered  .  Influenza Inj Mdck Quad Pf 01/09/2018  . Influenza,inj,Quad PF,6+ Mos 12/07/2015, 01/09/2018, 01/19/2019, 01/24/2020  . Influenza-Unspecified 01/10/2015, 01/08/2017  . PFIZER(Purple Top)SARS-COV-2 Vaccination 06/17/2019, 07/12/2019, 01/27/2020  . Pneumococcal Conjugate-13 12/07/2015  . Pneumococcal  Polysaccharide-23 01/08/2017  . Td 02/05/1998, 07/13/2008  . Tdap 03/08/2019  . Zoster 09/09/2013  . Zoster Recombinat (Shingrix) 03/08/2019, 05/10/2019     Objective: Vital Signs: BP 116/80 (BP Location: Left Arm, Patient Position: Sitting, Cuff Size: Normal)   Pulse 69   Resp 16   Ht 5\' 4"  (1.626 m)   Wt 215 lb (97.5 kg)   BMI 36.90 kg/m    Physical Exam Vitals and nursing note reviewed.  Constitutional:      Appearance: She is well-developed.  HENT:     Head: Normocephalic and atraumatic.  Eyes:     Conjunctiva/sclera: Conjunctivae normal.  Cardiovascular:     Rate and Rhythm: Normal rate and regular rhythm.     Heart sounds: Normal heart sounds.  Pulmonary:     Effort: Pulmonary effort is normal.     Breath sounds: Normal breath sounds.  Abdominal:     General: Bowel sounds are normal.     Palpations: Abdomen is soft.  Musculoskeletal:     Cervical back: Normal range of motion.  Lymphadenopathy:     Cervical: No cervical adenopathy.  Skin:    General: Skin is warm and dry.     Capillary Refill: Capillary refill takes less than 2 seconds.  Neurological:     Mental Status: She is alert and oriented to person, place, and time.  Psychiatric:        Behavior: Behavior normal.      Musculoskeletal Exam: C-spine was in good range of motion.  Shoulder joints, left elbow joint, wrist joints were in good range of motion.  She has right elbow joint contracture from previous injury with no synovitis.  She had no synovitis of her MCP joints.  Synovial thickening was noted over right second MCP joint and bilateral fifth PIP joints.  Hip joints, knee joints with good range of motion.  She had no tenderness over MTPs or ankle joints.  CDAI Exam: CDAI Score: 0  Patient Global: 0 mm; Provider Global: 0 mm Swollen: 0 ; Tender: 0  Joint Exam 06/21/2020   No joint exam has been documented for this visit   There is currently no information documented on the homunculus. Go to  the Rheumatology activity and complete the homunculus joint exam.  Investigation: No additional findings.  Imaging: No results found.  Recent Labs: Lab Results  Component Value Date   WBC 5.2 01/19/2020   HGB 15.3 01/19/2020   PLT 185 01/19/2020   NA 138 02/29/2020   K 4.9 02/29/2020   CL 103 02/29/2020   CO2 29 02/29/2020   GLUCOSE 105 (H) 02/29/2020   BUN 11 02/29/2020   CREATININE 0.85 02/29/2020   BILITOT 0.7 02/29/2020   ALKPHOS 80 02/29/2020   AST 19 02/29/2020   ALT 17 02/29/2020   PROT 6.7 02/29/2020   ALBUMIN 4.0 02/29/2020   CALCIUM 9.5 02/29/2020   GFRAA 102 01/19/2020   QFTBGOLDPLUS NEGATIVE 01/19/2020    Speciality Comments: No specialty comments available.  Procedures:  No procedures performed Allergies: Naproxen   Assessment / Plan:     Visit Diagnoses: Rheumatoid arthritis involving multiple sites with positive rheumatoid factor (HCC)-she is doing very well on the combination of Humira and methotrexate combination.  She had no synovitis on my examination today.  She has some synovial thickening.  She has not had a flare in a long time.  We discussed maybe spacing Humira to every 3 weeks in the future if she continues to does well at the follow-up visit.  High risk medication use - Humira 40 mg sq injection every 14 days, methotrexate 2.5 mg 5 tablets every 7 days, folic acid 1 mg 1 tablet daily. - Plan: CBC with Differential/Platelet, COMPLETE METABOLIC PANEL WITH GFR today and then every 3 months to monitor for drug toxicity.  Side effects of Humira and methotrexate were reviewed.  She was advised to stop Humira or methotrexate in case she develops an infection.  The medications could be resumed once the infection resolves.  Yearly skin examination for Humira was advised.  Patient is fully vaccinated against COVID-19.  Use of fourth dose of COVID-19 vaccine 6 months after the third dose was advised.  She also received her Shingrix vaccine.  Primary  osteoarthritis of both hands-she has osteoarthritis in her hands.  Joint protection muscle strengthening was discussed.  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 Unchanged.   Other medical problems are listed as follows:  History of hypothyroidism  History of hematuria  History of kidney stones  Prediabetes  Orders: Orders Placed This Encounter  Procedures  . CBC with Differential/Platelet  . COMPLETE METABOLIC PANEL WITH GFR   No orders of the defined types were placed in this encounter.   Follow-Up Instructions: Return in about 5 months (around 11/21/2020) for Rheumatoid arthritis.   Bo Merino, MD  Note - This record has been created using Editor, commissioning.  Chart creation errors have been sought, but may not always  have been located. Such creation errors do not reflect on  the standard of medical care.

## 2020-06-21 ENCOUNTER — Other Ambulatory Visit: Payer: Self-pay

## 2020-06-21 ENCOUNTER — Encounter: Payer: Self-pay | Admitting: Rheumatology

## 2020-06-21 ENCOUNTER — Ambulatory Visit (INDEPENDENT_AMBULATORY_CARE_PROVIDER_SITE_OTHER): Payer: BC Managed Care – PPO | Admitting: Rheumatology

## 2020-06-21 VITALS — BP 116/80 | HR 69 | Resp 16 | Ht 64.0 in | Wt 215.0 lb

## 2020-06-21 DIAGNOSIS — Z8639 Personal history of other endocrine, nutritional and metabolic disease: Secondary | ICD-10-CM

## 2020-06-21 DIAGNOSIS — R5383 Other fatigue: Secondary | ICD-10-CM

## 2020-06-21 DIAGNOSIS — Z79899 Other long term (current) drug therapy: Secondary | ICD-10-CM | POA: Diagnosis not present

## 2020-06-21 DIAGNOSIS — M24521 Contracture, right elbow: Secondary | ICD-10-CM

## 2020-06-21 DIAGNOSIS — M19041 Primary osteoarthritis, right hand: Secondary | ICD-10-CM

## 2020-06-21 DIAGNOSIS — M19042 Primary osteoarthritis, left hand: Secondary | ICD-10-CM

## 2020-06-21 DIAGNOSIS — R7303 Prediabetes: Secondary | ICD-10-CM

## 2020-06-21 DIAGNOSIS — Z87442 Personal history of urinary calculi: Secondary | ICD-10-CM

## 2020-06-21 DIAGNOSIS — M0579 Rheumatoid arthritis with rheumatoid factor of multiple sites without organ or systems involvement: Secondary | ICD-10-CM

## 2020-06-21 DIAGNOSIS — Z87448 Personal history of other diseases of urinary system: Secondary | ICD-10-CM

## 2020-06-21 LAB — COMPLETE METABOLIC PANEL WITH GFR
AG Ratio: 1.7 (calc) (ref 1.0–2.5)
ALT: 22 U/L (ref 6–29)
AST: 27 U/L (ref 10–35)
Albumin: 4.3 g/dL (ref 3.6–5.1)
Alkaline phosphatase (APISO): 75 U/L (ref 37–153)
BUN: 16 mg/dL (ref 7–25)
CO2: 29 mmol/L (ref 20–32)
Calcium: 9.7 mg/dL (ref 8.6–10.4)
Chloride: 105 mmol/L (ref 98–110)
Creat: 0.82 mg/dL (ref 0.50–1.05)
GFR, Est African American: 91 mL/min/{1.73_m2} (ref >=60)
GFR, Est Non African American: 78 mL/min/{1.73_m2} (ref >=60)
Globulin: 2.5 g/dL (calc) (ref 1.9–3.7)
Glucose, Bld: 95 mg/dL (ref 65–99)
Potassium: 5.2 mmol/L (ref 3.5–5.3)
Sodium: 140 mmol/L (ref 135–146)
Total Bilirubin: 0.8 mg/dL (ref 0.2–1.2)
Total Protein: 6.8 g/dL (ref 6.1–8.1)

## 2020-06-21 LAB — CBC WITH DIFFERENTIAL/PLATELET
Absolute Monocytes: 456 cells/uL (ref 200–950)
Basophils Absolute: 29 cells/uL (ref 0–200)
Basophils Relative: 0.6 %
Eosinophils Absolute: 78 cells/uL (ref 15–500)
Eosinophils Relative: 1.6 %
HCT: 43.1 % (ref 35.0–45.0)
Hemoglobin: 14.8 g/dL (ref 11.7–15.5)
Lymphs Abs: 1916 cells/uL (ref 850–3900)
MCH: 34 pg — ABNORMAL HIGH (ref 27.0–33.0)
MCHC: 34.3 g/dL (ref 32.0–36.0)
MCV: 99.1 fL (ref 80.0–100.0)
MPV: 12.5 fL (ref 7.5–12.5)
Monocytes Relative: 9.3 %
Neutro Abs: 2421 cells/uL (ref 1500–7800)
Neutrophils Relative %: 49.4 %
Platelets: 176 10*3/uL (ref 140–400)
RBC: 4.35 10*6/uL (ref 3.80–5.10)
RDW: 12.9 % (ref 11.0–15.0)
Total Lymphocyte: 39.1 %
WBC: 4.9 10*3/uL (ref 3.8–10.8)

## 2020-06-21 NOTE — Patient Instructions (Signed)
Standing Labs We placed an order today for your standing lab work.   Please have your standing labs drawn in  June and every 3 months  If possible, please have your labs drawn 2 weeks prior to your appointment so that the provider can discuss your results at your appointment.  We have open lab daily Monday through Thursday from 1:30-4:30 PM and Friday from 1:30-4:00 PM at the office of Dr. Bo Merino, [facility].   Please be advised, all patients with office appointments requiring lab work will take precedents over walk-in lab work.  If possible, please come for your lab work on Monday and Friday afternoons, as you may experience shorter wait times. The office is located at [address], [place], [place], [place] [address] No appointment is necessary.   Labs are drawn by [facility]. Please bring your co-pay at the time of your lab draw.  You may receive a bill from [facility] for your lab work.  If you wish to have your labs drawn at another location, please call the office 24 hours in advance to send orders.  If you have any questions regarding directions or hours of operation,  please call [phone].   As a reminder, please drink plenty of water prior to coming for your lab work. Thanks!   Heart Disease Prevention   Your inflammatory disease increases your risk of heart disease which includes heart attack, stroke, atrial fibrillation (irregular heartbeats), high blood pressure, heart failure and atherosclerosis (plaque in the arteries).  It is important to reduce your risk by:   . Keep blood pressure, cholesterol, and blood sugar at healthy levels   . Smoking Cessation   . Maintain a healthy weight  o BMI 20-25   . Eat a healthy diet  o Plenty of fresh fruit, vegetables, and whole grains  o Limit saturated fats, foods high in sodium, and added sugars  o DASH and Mediterranean diet   . Increase physical activity  o Recommend moderate physically activity for  150 minutes per week/ 30 minutes a day for five days a week These can be broken up into three separate ten-minute sessions during the day.   . Reduce Stress  . Meditation, slow breathing exercises, yoga, coloring books  . Dental visits twice a year

## 2020-06-22 NOTE — Progress Notes (Signed)
CBC and CMP are normal.

## 2020-06-27 DIAGNOSIS — R7303 Prediabetes: Secondary | ICD-10-CM | POA: Diagnosis not present

## 2020-07-24 ENCOUNTER — Other Ambulatory Visit: Payer: Self-pay | Admitting: Rheumatology

## 2020-07-24 NOTE — Telephone Encounter (Signed)
Next Visit: 11/22/2020  Last Visit: 06/21/2020  Last Fill: 05/03/2020  DX:  Rheumatoid arthritis involving multiple sites with positive rheumatoid factor   Current Dose per office note 06/21/2020, methotrexate 2.5 mg 5 tablets every 7 days  Labs: 06/21/2020, CBC and CMP are normal.  Okay to refill MTX?

## 2020-08-08 ENCOUNTER — Other Ambulatory Visit: Payer: Self-pay | Admitting: Physician Assistant

## 2020-08-08 NOTE — Telephone Encounter (Signed)
Next Visit: 11/22/2020  Last Visit: 06/21/2020  Last Fill: 06/05/2020  DX:  Rheumatoid arthritis involving multiple sites with positive rheumatoid factor  Current Dose per office note 06/21/2020, Humira 40 mg sq injection every 14 days,  Labs: 317/2022, CBC and CMP are normal.  TB Gold: 01/19/2020, negative  Okay to refill Humira?

## 2020-09-12 ENCOUNTER — Other Ambulatory Visit: Payer: Self-pay

## 2020-09-12 DIAGNOSIS — Z79899 Other long term (current) drug therapy: Secondary | ICD-10-CM | POA: Diagnosis not present

## 2020-09-13 ENCOUNTER — Telehealth: Payer: Self-pay | Admitting: *Deleted

## 2020-09-13 DIAGNOSIS — Z79899 Other long term (current) drug therapy: Secondary | ICD-10-CM

## 2020-09-13 LAB — COMPLETE METABOLIC PANEL WITH GFR
AG Ratio: 1.7 (calc) (ref 1.0–2.5)
ALT: 18 U/L (ref 6–29)
AST: 21 U/L (ref 10–35)
Albumin: 4.3 g/dL (ref 3.6–5.1)
Alkaline phosphatase (APISO): 74 U/L (ref 37–153)
BUN: 18 mg/dL (ref 7–25)
CO2: 27 mmol/L (ref 20–32)
Calcium: 9.3 mg/dL (ref 8.6–10.4)
Chloride: 105 mmol/L (ref 98–110)
Creat: 0.83 mg/dL (ref 0.50–1.05)
GFR, Est African American: 89 mL/min/{1.73_m2} (ref >=60)
GFR, Est Non African American: 77 mL/min/{1.73_m2} (ref >=60)
Globulin: 2.5 g/dL (calc) (ref 1.9–3.7)
Glucose, Bld: 90 mg/dL (ref 65–99)
Potassium: 5 mmol/L (ref 3.5–5.3)
Sodium: 139 mmol/L (ref 135–146)
Total Bilirubin: 0.6 mg/dL (ref 0.2–1.2)
Total Protein: 6.8 g/dL (ref 6.1–8.1)

## 2020-09-13 LAB — CBC WITH DIFFERENTIAL/PLATELET
Absolute Monocytes: 525 cells/uL (ref 200–950)
Basophils Absolute: 30 cells/uL (ref 0–200)
Basophils Relative: 0.6 %
Eosinophils Absolute: 110 cells/uL (ref 15–500)
Eosinophils Relative: 2.2 %
HCT: 42.1 % (ref 35.0–45.0)
Hemoglobin: 14.2 g/dL (ref 11.7–15.5)
Lymphs Abs: 1530 cells/uL (ref 850–3900)
MCH: 33.6 pg — ABNORMAL HIGH (ref 27.0–33.0)
MCHC: 33.7 g/dL (ref 32.0–36.0)
MCV: 99.8 fL (ref 80.0–100.0)
MPV: 12.7 fL — ABNORMAL HIGH (ref 7.5–12.5)
Monocytes Relative: 10.5 %
Neutro Abs: 2805 cells/uL (ref 1500–7800)
Neutrophils Relative %: 56.1 %
Platelets: 138 10*3/uL — ABNORMAL LOW (ref 140–400)
RBC: 4.22 10*6/uL (ref 3.80–5.10)
RDW: 12.8 % (ref 11.0–15.0)
Total Lymphocyte: 30.6 %
WBC: 5 10*3/uL (ref 3.8–10.8)

## 2020-09-13 NOTE — Telephone Encounter (Signed)
-----   Message from Ofilia Neas, PA-C sent at 09/13/2020 10:20 AM EDT ----- CMP WNL.  Platelet count is slightly low-138. Rest of CBC WNL. Please advise the patient to reduce methotrexate to 4 tablets once weekly.  Recheck CBC with diff in 1 month.

## 2020-10-13 ENCOUNTER — Other Ambulatory Visit: Payer: Self-pay | Admitting: Physician Assistant

## 2020-10-15 NOTE — Telephone Encounter (Signed)
Next Visit: 11/22/2020   Last Visit: 06/21/2020   Last Fill: 07/24/2020  DX:  Rheumatoid arthritis involving multiple sites with positive rheumatoid factor   Current Dose per lab note on 09/12/2020: advise the patient to reduce methotrexate to 4 tablets once weekly.  Labs: 09/12/2020 CMP WNL.  Platelet count is slightly low-138. Rest of CBC WNL. Please advise the patient to reduce methotrexate to 4 tablets once weekly.  Recheck CBC withdiff in 1 month.   Okay to refill MTX?

## 2020-10-18 ENCOUNTER — Other Ambulatory Visit: Payer: Self-pay

## 2020-10-18 DIAGNOSIS — Z79899 Other long term (current) drug therapy: Secondary | ICD-10-CM | POA: Diagnosis not present

## 2020-10-18 LAB — CBC WITH DIFFERENTIAL/PLATELET
Absolute Monocytes: 468 cells/uL (ref 200–950)
Basophils Absolute: 42 cells/uL (ref 0–200)
Basophils Relative: 0.8 %
Eosinophils Absolute: 109 cells/uL (ref 15–500)
Eosinophils Relative: 2.1 %
HCT: 42.9 % (ref 35.0–45.0)
Hemoglobin: 14.5 g/dL (ref 11.7–15.5)
Lymphs Abs: 1862 cells/uL (ref 850–3900)
MCH: 33.3 pg — ABNORMAL HIGH (ref 27.0–33.0)
MCHC: 33.8 g/dL (ref 32.0–36.0)
MCV: 98.4 fL (ref 80.0–100.0)
MPV: 13 fL — ABNORMAL HIGH (ref 7.5–12.5)
Monocytes Relative: 9 %
Neutro Abs: 2720 cells/uL (ref 1500–7800)
Neutrophils Relative %: 52.3 %
Platelets: 140 10*3/uL (ref 140–400)
RBC: 4.36 10*6/uL (ref 3.80–5.10)
RDW: 12.7 % (ref 11.0–15.0)
Total Lymphocyte: 35.8 %
WBC: 5.2 10*3/uL (ref 3.8–10.8)

## 2020-10-19 NOTE — Progress Notes (Signed)
I would recommend continuing on 4 tablets of MTX per week.  Her PLT count has returned to [facility] but remains on the low end of normal.

## 2020-10-21 ENCOUNTER — Other Ambulatory Visit: Payer: Self-pay | Admitting: Rheumatology

## 2020-10-22 NOTE — Telephone Encounter (Signed)
Next Visit: 11/22/2020  Last Visit: 06/21/2020  Last Fill: 08/19/2019  Dx:  Rheumatoid arthritis involving multiple sites with positive rheumatoid factor  Current Dose per office note on 1/43/8887: folic acid 1 mg 1 tablet daily  Okay to refill folic acid?

## 2020-10-31 NOTE — Progress Notes (Signed)
Office Visit Note  Patient: Amy Hart             Date of Birth: 02-Nov-1960           MRN: [id number]             PCP: Jinny Sanders, MD Referring: Jinny Sanders, MD Visit Date: 11/13/2020 Occupation: '@GUAROCC'$ @  Subjective:  Medication monitoring   History of Present Illness: Amy Hart is a 60 y.o. female with history of seropositive rheumatoid arthritis.  She is on Humira 40 mg sq injections every 14 days, methotrexate 4 tablets by mouth once weekly, and folic acid 1 mg daily.  She is tolerating these medications without any side effects and has not missed any doses recently.  She denies any recent rheumatoid arthritis flares.  She states that her joint pain and stiffness has improved significantly during the summertime.  She states that she continues to have some stiffness in thickening in bilateral fifth PIP joints but denies any increased tenderness or swelling at this time.  She denies any morning stiffness or nocturnal pain. She denies any new medical conditions or concerns at this time.  She denies any recent infections.   Activities of Daily Living:  Patient reports morning stiffness for 0 minutes.   Patient Denies nocturnal pain.  Difficulty dressing/grooming: Denies Difficulty climbing stairs: Denies Difficulty getting out of chair: Denies Difficulty using hands for taps, buttons, cutlery, and/or writing: Denies  Review of Systems  Constitutional:  Negative for fatigue.  HENT:  Negative for mouth sores, mouth dryness and nose dryness.   Eyes:  Negative for pain, itching and dryness.  Respiratory:  Negative for shortness of breath and difficulty breathing.   Cardiovascular:  Negative for chest pain and palpitations.  Gastrointestinal:  Negative for blood in stool, constipation and diarrhea.  Endocrine: Negative for increased urination.  Genitourinary:  Negative for difficulty urinating.  Musculoskeletal:  Positive for joint pain and joint pain. Negative for  joint swelling, myalgias, morning stiffness, muscle tenderness and myalgias.  Skin:  Negative for color change, rash and redness.  Allergic/Immunologic: Negative for susceptible to infections.  Neurological:  Positive for numbness. Negative for dizziness, headaches, memory loss and weakness.  Hematological:  Negative for bruising/bleeding tendency.  Psychiatric/Behavioral:  Negative for confusion.    PMFS History:  Patient Active Problem List   Diagnosis Date Noted   BMI 37.0-37.9, adult 03/09/2020   Prediabetes 01/08/2017   Epistaxis, recurrent 12/05/2014   Menopausal syndrome 12/05/2014   Rheumatoid arthritis ([place]) 01/02/2012   Pure hypercholesterolemia 08/29/2009   Osteoarthritis, hands 08/24/2009   ROTATOR CUFF SYNDROME 08/04/2007   Hypothyroidism 05/19/2007   ALLERGIC RHINITIS, SEASONAL 05/19/2007    Past Medical History:  Diagnosis Date   Allergic rhinitis due to pollen    Collagen vascular disease ([place])    Rheumatoid arthritis(714.0)    Unspecified hypothyroidism     Family History  Problem Relation Age of Onset   Hypertension Mother    Thyroid disease Mother    Nephrolithiasis Mother    Diabetes Father    Kidney Stones Sister    Past Surgical History:  Procedure Laterality Date   BREAST BIOPSY  2003   right, negative   ENDOMETRIAL ABLATION  2005   FRACTURE SURGERY  01/01   right elbow   ROTATOR CUFF REPAIR  06/2008   R. Cuff tear   Laurieann Friddle BUNIONECTOMY     right   TREATMENT FISTULA ANAL  1993   repair  Social History   Social History Narrative   No exercise   Diet: Moderate   She has a Sales promotion account executive   Lives with her partner   Immunization History  Administered Date(s) Administered   Influenza Inj Mdck Quad Pf 01/09/2018   Influenza,inj,Quad PF,6+ Mos 12/07/2015, 01/09/2018, 01/19/2019, 01/24/2020   Influenza-Unspecified 01/10/2015, 01/08/2017   PFIZER(Purple Top)SARS-COV-2 Vaccination 06/17/2019, 07/12/2019, 01/27/2020, 07/27/2020    Pneumococcal Conjugate-13 12/07/2015   Pneumococcal Polysaccharide-23 01/08/2017   Td 02/05/1998, 07/13/2008   Tdap 03/08/2019   Zoster Recombinat (Shingrix) 03/08/2019, 05/10/2019   Zoster, Live 09/09/2013     Objective: Vital Signs: BP 119/78 (BP Location: Left Arm, Patient Position: Sitting, Cuff Size: Normal)   Pulse 71   Ht '5\' 4"'$  (1.626 m)   Wt 193 lb (87.5 kg)   BMI 33.13 kg/m    Physical Exam Vitals and nursing note reviewed.  Constitutional:      Appearance: She is well-developed.  HENT:     Head: Normocephalic and atraumatic.  Eyes:     Conjunctiva/sclera: Conjunctivae normal.  Pulmonary:     Effort: Pulmonary effort is normal.  Abdominal:     Palpations: Abdomen is soft.  Musculoskeletal:     Cervical back: Normal range of motion.  Skin:    General: Skin is warm and dry.     Capillary Refill: Capillary refill takes less than 2 seconds.  Neurological:     Mental Status: She is alert and oriented to person, place, and time.  Psychiatric:        Behavior: Behavior normal.     Musculoskeletal Exam: C-spine, thoracic spine, lumbar spine have good range of motion with no discomfort.  Shoulder joints have good range of motion with no discomfort.  Right elbow joint flexion contracture noted.  Wrist joints have good range of motion with no discomfort or tenderness.  Contractures of bilateral fifth PIP joints noted.  PIP and DIP thickening consistent with osteoarthritis of both hands noted.  No tenderness or synovitis over MCP joints.  Hip joints have good range of motion with no discomfort.  Knee joints have good range of motion with no warmth or effusion.  Ankle joints have good range of motion with no tenderness or joint swelling.  CDAI Exam: CDAI Score: 0.2  Patient Global: 1 mm; Provider Global: 1 mm Swollen: 0 ; Tender: 0  Joint Exam 11/13/2020   No joint exam has been documented for this visit   There is currently no information documented on the homunculus. Go  to the Rheumatology activity and complete the homunculus joint exam.  Investigation: No additional findings.  Imaging: No results found.  Recent Labs: Lab Results  Component Value Date   WBC 5.2 10/18/2020   HGB 14.5 10/18/2020   PLT 140 10/18/2020   NA 139 09/12/2020   K 5.0 09/12/2020   CL 105 09/12/2020   CO2 27 09/12/2020   GLUCOSE 90 09/12/2020   BUN 18 09/12/2020   CREATININE 0.83 09/12/2020   BILITOT 0.6 09/12/2020   ALKPHOS 80 02/29/2020   AST 21 09/12/2020   ALT 18 09/12/2020   PROT 6.8 09/12/2020   ALBUMIN 4.0 02/29/2020   CALCIUM 9.3 09/12/2020   GFRAA 89 09/12/2020   QFTBGOLDPLUS NEGATIVE 01/19/2020    Speciality Comments: No specialty comments available.  Procedures:  No procedures performed Allergies: Naproxen   Assessment / Plan:     Visit Diagnoses: Rheumatoid arthritis involving multiple sites with positive rheumatoid factor ([place]): She has no joint tenderness  or synovitis on examination today.  She has not had any recent rheumatoid arthritis flares.  She is clinically doing well on Humira 40 mg subcutaneous injections every 14 days, methotrexate 4 tablets by mouth once weekly, and folic acid 1 mg by mouth daily.  She has not missed any doses of these medications recently and continues to not have any side effects.  She has not been experiencing any morning stiffness, difficulty with ADLs, or nocturnal pain.  We discussed updating x-rays of both hands and feet today to assess for radiographic progression but she declined at this time.  She is open to obtaining updated x-rays at her follow-up visit.  She will remain on the current treatment regimen.  She was advised to notify us if she develops increased joint pain or joint swelling.  She will follow-up in the office in 5 months.  High risk medication use - Humira 40 mg sq injection every 14 days, methotrexate 2.5 mg 4 tablets every 7 days, and folic acid 1 mg 1 tablet daily.  TB gold negative on 01/19/20.   Future order for TB gold placed today.  CBC updated on 10/18/20.  CMP updated on 09/12/20.  She will be due to update lab work in October and every 3 months to monitor for drug toxicity. - Plan: QuantiFERON-TB Gold Plus, CBC with Differential/Platelet, COMPLETE METABOLIC PANEL WITH GFR She has not had any recent infections.  We discussed the importance of holding methotrexate and Humira if she develops signs or symptoms of an infection and to resume once the infection has completely cleared. Discussed the importance of yearly skin exams while on Humira due to the increased risk for skin cancer.  According to the patient she established care with a dermatologist and was told to follow-up every 5 years for skin exams.    Screening for tuberculosis -Future order for TB gold placed today.  Plan: QuantiFERON-TB Gold Plus  Primary osteoarthritis of both hands: She has PIP and DIP thickening consistent with osteoarthritis of both hands.  Contractures of bilateral fifth PIP joints noted.  No synovitis was apparent on examination today.  She experiences occasional stiffness in her hands but has not had any recent flares.  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 Unchanged.  No tenderness or inflammation.  Other medical conditions are listed as follows:  History of hypothyroidism  History of hematuria  Prediabetes  History of kidney stones  Orders: Orders Placed This Encounter  Procedures   QuantiFERON-TB Gold Plus   CBC with Differential/Platelet   COMPLETE METABOLIC PANEL WITH GFR   No orders of the defined types were placed in this encounter.    Follow-Up Instructions: Return in about 5 months (around 04/15/2021) for Rheumatoid arthritis.   Ofilia Neas, PA-C  Note - This record has been created using Dragon software.  Chart creation errors have been sought, but may not always  have been located. Such creation errors do not reflect on  the standard of medical care.

## 2020-11-13 ENCOUNTER — Encounter: Payer: Self-pay | Admitting: Physician Assistant

## 2020-11-13 ENCOUNTER — Ambulatory Visit (INDEPENDENT_AMBULATORY_CARE_PROVIDER_SITE_OTHER): Payer: BC Managed Care – PPO | Admitting: Physician Assistant

## 2020-11-13 ENCOUNTER — Other Ambulatory Visit: Payer: Self-pay

## 2020-11-13 VITALS — BP 119/78 | HR 71 | Ht 64.0 in | Wt 193.0 lb

## 2020-11-13 DIAGNOSIS — M24521 Contracture, right elbow: Secondary | ICD-10-CM | POA: Diagnosis not present

## 2020-11-13 DIAGNOSIS — Z87448 Personal history of other diseases of urinary system: Secondary | ICD-10-CM

## 2020-11-13 DIAGNOSIS — M19041 Primary osteoarthritis, right hand: Secondary | ICD-10-CM

## 2020-11-13 DIAGNOSIS — M0579 Rheumatoid arthritis with rheumatoid factor of multiple sites without organ or systems involvement: Secondary | ICD-10-CM | POA: Diagnosis not present

## 2020-11-13 DIAGNOSIS — Z8639 Personal history of other endocrine, nutritional and metabolic disease: Secondary | ICD-10-CM

## 2020-11-13 DIAGNOSIS — Z79899 Other long term (current) drug therapy: Secondary | ICD-10-CM | POA: Diagnosis not present

## 2020-11-13 DIAGNOSIS — R7303 Prediabetes: Secondary | ICD-10-CM

## 2020-11-13 DIAGNOSIS — M19042 Primary osteoarthritis, left hand: Secondary | ICD-10-CM

## 2020-11-13 DIAGNOSIS — Z87442 Personal history of urinary calculi: Secondary | ICD-10-CM

## 2020-11-13 DIAGNOSIS — Z111 Encounter for screening for respiratory tuberculosis: Secondary | ICD-10-CM

## 2020-11-13 NOTE — Patient Instructions (Signed)
Standing Labs We placed an order today for your standing lab work.   Please have your standing labs drawn in October and every 3 months   If possible, please have your labs drawn 2 weeks prior to your appointment so that the provider can discuss your results at your appointment.  Please note that you may see your imaging and lab results in MyChart before we have reviewed them. We may be awaiting multiple results to interpret others before contacting you. Please allow our office up to 72 hours to thoroughly review all of the results before contacting the office for clarification of your results.  We have open lab daily: Monday through Thursday from 1:30-4:30 PM and Friday from 1:30-4:00 PM at the office of Dr. Shaili Deveshwar, [facility].   Please be advised, all patients with office appointments requiring lab work will take precedent over walk-in lab work.  If possible, please come for your lab work on Monday and Friday afternoons, as you may experience shorter wait times. The office is located at [address], [address], [place], [place] [address] No appointment is necessary.   Labs are drawn by [facility]. Please bring your co-pay at the time of your lab draw.  You may receive a bill from [facility] for your lab work.  If you wish to have your labs drawn at another location, please call the office 24 hours in advance to send orders.  If you have any questions regarding directions or hours of operation,  please call [phone].   As a reminder, please drink plenty of water prior to coming for your lab work. Thanks!  

## 2020-11-22 ENCOUNTER — Ambulatory Visit: Payer: BC Managed Care – PPO | Admitting: Physician Assistant

## 2020-12-12 DIAGNOSIS — R7303 Prediabetes: Secondary | ICD-10-CM | POA: Diagnosis not present

## 2021-01-07 ENCOUNTER — Other Ambulatory Visit: Payer: Self-pay | Admitting: *Deleted

## 2021-01-07 ENCOUNTER — Other Ambulatory Visit: Payer: Self-pay | Admitting: Physician Assistant

## 2021-01-07 DIAGNOSIS — Z79899 Other long term (current) drug therapy: Secondary | ICD-10-CM

## 2021-01-07 DIAGNOSIS — Z111 Encounter for screening for respiratory tuberculosis: Secondary | ICD-10-CM

## 2021-01-07 NOTE — Telephone Encounter (Signed)
Next Visit: 04/18/2021  Last Visit: 11/13/2020  Last Fill: 10/15/2020  DX: Rheumatoid arthritis involving multiple sites with positive rheumatoid factor   Current Dose per office note 11/13/2020: methotrexate 2.5 mg 4 tablets every 7 days  Labs: 09/12/2020 CMP WNL.  Platelet count is slightly low-138. Rest of CBC WNL. 10/18/2020 CBC stable.   Patient advised she is due to update labs. Patient states she will update them this week.   Okay to refill MTX?

## 2021-01-08 NOTE — Progress Notes (Signed)
CMP WNL. CBC stable.

## 2021-01-10 LAB — CBC WITH DIFFERENTIAL/PLATELET
Absolute Monocytes: 409 cells/uL (ref 200–950)
Basophils Absolute: 52 cells/uL (ref 0–200)
Basophils Relative: 1.1 %
Eosinophils Absolute: 89 cells/uL (ref 15–500)
Eosinophils Relative: 1.9 %
HCT: 42.2 % (ref 35.0–45.0)
Hemoglobin: 14.4 g/dL (ref 11.7–15.5)
Lymphs Abs: 1701 cells/uL (ref 850–3900)
MCH: 33.7 pg — ABNORMAL HIGH (ref 27.0–33.0)
MCHC: 34.1 g/dL (ref 32.0–36.0)
MCV: 98.8 fL (ref 80.0–100.0)
MPV: 13.2 fL — ABNORMAL HIGH (ref 7.5–12.5)
Monocytes Relative: 8.7 %
Neutro Abs: 2449 cells/uL (ref 1500–7800)
Neutrophils Relative %: 52.1 %
Platelets: 142 10*3/uL (ref 140–400)
RBC: 4.27 10*6/uL (ref 3.80–5.10)
RDW: 12.8 % (ref 11.0–15.0)
Total Lymphocyte: 36.2 %
WBC: 4.7 10*3/uL (ref 3.8–10.8)

## 2021-01-10 LAB — COMPLETE METABOLIC PANEL WITH GFR
AG Ratio: 1.8 (calc) (ref 1.0–2.5)
ALT: 16 U/L (ref 6–29)
AST: 21 U/L (ref 10–35)
Albumin: 4.4 g/dL (ref 3.6–5.1)
Alkaline phosphatase (APISO): 73 U/L (ref 37–153)
BUN: 11 mg/dL (ref 7–25)
CO2: 26 mmol/L (ref 20–32)
Calcium: 9.7 mg/dL (ref 8.6–10.4)
Chloride: 105 mmol/L (ref 98–110)
Creat: 0.72 mg/dL (ref 0.50–1.03)
Globulin: 2.4 g/dL (calc) (ref 1.9–3.7)
Glucose, Bld: 100 mg/dL — ABNORMAL HIGH (ref 65–99)
Potassium: 4.6 mmol/L (ref 3.5–5.3)
Sodium: 140 mmol/L (ref 135–146)
Total Bilirubin: 0.8 mg/dL (ref 0.2–1.2)
Total Protein: 6.8 g/dL (ref 6.1–8.1)
eGFR: 96 mL/min/{1.73_m2} (ref >=60)

## 2021-01-10 LAB — QUANTIFERON-TB GOLD PLUS
Mitogen-NIL: >10 IU/mL
NIL: 0.04 IU/mL
QuantiFERON-TB Gold Plus: NEGATIVE
TB1-NIL: <0 IU/mL
TB2-NIL: <0 IU/mL

## 2021-01-10 NOTE — Progress Notes (Signed)
TB gold negative

## 2021-01-16 ENCOUNTER — Other Ambulatory Visit (HOSPITAL_COMMUNITY): Payer: Self-pay

## 2021-01-16 MED ORDER — INFLUENZA VAC SPLIT QUAD 0.5 ML IM SUSY
PREFILLED_SYRINGE | INTRAMUSCULAR | 0 refills | Status: DC
  Filled 2021-01-16: qty 0.5, 1d supply, fill #0

## 2021-01-28 ENCOUNTER — Other Ambulatory Visit: Payer: Self-pay | Admitting: Physician Assistant

## 2021-01-28 NOTE — Telephone Encounter (Signed)
Next Visit: 04/18/2021   Last Visit: 11/13/2020   Last Fill: 01/07/2021 (30 day supply)   DX: Rheumatoid arthritis involving multiple sites with positive rheumatoid factor    Current Dose per office note 11/13/2020: methotrexate 2.5 mg 4 tablets every 7 days   Labs: 01/07/2021 CMP WNL.  CBC stable.   Okay to refill MTX?

## 2021-02-06 ENCOUNTER — Other Ambulatory Visit: Payer: Self-pay | Admitting: Physician Assistant

## 2021-02-06 NOTE — Telephone Encounter (Signed)
Next Visit: 04/18/2021  Last Visit: 11/13/2020  Last Fill: 08/08/2020  KK:DPTELMRAJH arthritis involving multiple sites with positive rheumatoid factor   Current Dose per office note 11/13/2020: Humira 40 mg sq injection every 14 days  Labs: 01/07/2021, CMP WNL.  CBC stable.   TB Gold: 01/07/2021,  TB gold negative.   Okay to refill Humira?

## 2021-03-07 ENCOUNTER — Telehealth: Payer: Self-pay | Admitting: Family Medicine

## 2021-03-07 ENCOUNTER — Other Ambulatory Visit: Payer: Self-pay

## 2021-03-07 ENCOUNTER — Other Ambulatory Visit (INDEPENDENT_AMBULATORY_CARE_PROVIDER_SITE_OTHER): Payer: BC Managed Care – PPO

## 2021-03-07 DIAGNOSIS — R7303 Prediabetes: Secondary | ICD-10-CM

## 2021-03-07 DIAGNOSIS — E039 Hypothyroidism, unspecified: Secondary | ICD-10-CM

## 2021-03-07 DIAGNOSIS — E7800 Pure hypercholesterolemia, unspecified: Secondary | ICD-10-CM

## 2021-03-07 LAB — COMPREHENSIVE METABOLIC PANEL
ALT: 17 U/L (ref 0–35)
AST: 19 U/L (ref 0–37)
Albumin: 4.3 g/dL (ref 3.5–5.2)
Alkaline Phosphatase: 66 U/L (ref 39–117)
BUN: 12 mg/dL (ref 6–23)
CO2: 32 mEq/L (ref 19–32)
Calcium: 9.9 mg/dL (ref 8.4–10.5)
Chloride: 104 mEq/L (ref 96–112)
Creatinine, Ser: 0.87 mg/dL (ref 0.40–1.20)
GFR: 72.68 mL/min (ref >60.00)
Glucose, Bld: 97 mg/dL (ref 70–99)
Potassium: 4.7 mEq/L (ref 3.5–5.1)
Sodium: 140 mEq/L (ref 135–145)
Total Bilirubin: 0.8 mg/dL (ref 0.2–1.2)
Total Protein: 6.8 g/dL (ref 6.0–8.3)

## 2021-03-07 LAB — T4, FREE: Free T4: 0.8 ng/dL (ref 0.60–1.60)

## 2021-03-07 LAB — LIPID PANEL
Cholesterol: 182 mg/dL (ref 0–200)
HDL: 65.7 mg/dL (ref >39.00)
LDL Cholesterol: 94 mg/dL (ref 0–99)
NonHDL: 116.21
Total CHOL/HDL Ratio: 3
Triglycerides: 109 mg/dL (ref 0.0–149.0)
VLDL: 21.8 mg/dL (ref 0.0–40.0)

## 2021-03-07 LAB — HEMOGLOBIN A1C: Hgb A1c MFr Bld: 5.5 % (ref 4.6–6.5)

## 2021-03-07 LAB — T3, FREE: T3, Free: 2.9 pg/mL (ref 2.3–4.2)

## 2021-03-07 LAB — TSH: TSH: 1.19 u[IU]/mL (ref 0.35–5.50)

## 2021-03-07 NOTE — Progress Notes (Signed)
No critical labs need to be addressed urgently. We will discuss labs in detail at upcoming office visit.   

## 2021-03-07 NOTE — Telephone Encounter (Signed)
-----   Message from [name] sent at 02/19/2021  3:38 PM EST ----- Regarding: Lab orders for Thursday, 12.1.22 Patient is scheduled for CPX labs, please order future labs, Thanks , Karna Christmas

## 2021-03-14 ENCOUNTER — Other Ambulatory Visit: Payer: Self-pay

## 2021-03-14 ENCOUNTER — Ambulatory Visit (INDEPENDENT_AMBULATORY_CARE_PROVIDER_SITE_OTHER): Payer: BC Managed Care – PPO | Admitting: Family Medicine

## 2021-03-14 ENCOUNTER — Encounter: Payer: Self-pay | Admitting: Family Medicine

## 2021-03-14 VITALS — Temp 98.2°F | Ht 63.5 in | Wt 192.1 lb

## 2021-03-14 DIAGNOSIS — E7800 Pure hypercholesterolemia, unspecified: Secondary | ICD-10-CM | POA: Diagnosis not present

## 2021-03-14 DIAGNOSIS — E039 Hypothyroidism, unspecified: Secondary | ICD-10-CM | POA: Diagnosis not present

## 2021-03-14 DIAGNOSIS — Z6833 Body mass index (BMI) 33.0-33.9, adult: Secondary | ICD-10-CM

## 2021-03-14 DIAGNOSIS — R7303 Prediabetes: Secondary | ICD-10-CM | POA: Diagnosis not present

## 2021-03-14 DIAGNOSIS — Z0000 Encounter for general adult medical examination without abnormal findings: Secondary | ICD-10-CM | POA: Diagnosis not present

## 2021-03-14 DIAGNOSIS — E6609 Other obesity due to excess calories: Secondary | ICD-10-CM

## 2021-03-14 DIAGNOSIS — M722 Plantar fascial fibromatosis: Secondary | ICD-10-CM

## 2021-03-14 DIAGNOSIS — M0579 Rheumatoid arthritis with rheumatoid factor of multiple sites without organ or systems involvement: Secondary | ICD-10-CM

## 2021-03-14 MED ORDER — TRAMADOL HCL 50 MG PO TABS: 50.0000 mg | ORAL_TABLET | Freq: Three times a day (TID) | ORAL | 0 refills | Status: AC | PRN

## 2021-03-14 MED ORDER — PREDNISONE 10 MG PO TABS: ORAL_TABLET | ORAL | 0 refills | Status: DC

## 2021-03-14 NOTE — Assessment & Plan Note (Signed)
Stable, chronic.  Continue current medication.    levo 100 mcg daily

## 2021-03-14 NOTE — Assessment & Plan Note (Signed)
Add ice massage and theraband stretching if not improving in next 2 weeks start low dose prednisone taper.

## 2021-03-14 NOTE — Patient Instructions (Addendum)
Continue plantar fasciitis exercise.Marland Kitchen add ice massage and theraband.. if not improving in 2 weeks fill prescription for low dose prednisone.  Please call the location of your choice from the menu below to schedule your Mammogram and/or Bone Density appointment.    [facility]                      Phone:  [phone], [place] [address]                                                             Services: Traditional and 3D Mammogram, [place] [facility]                 Phone: [phone], [place] [address]    Service: Bone Density ONLY   *this site does NOT perform mammograms  [facility]                        Phone:  [phone] [address], [place] [address]                                            Services:  3D Mammogram and [place] at [facility]   Phone:  [phone]   [place], [place] [address]                                            Services: 3D Mammogram and [place] at [facility]  Phone:  [phone]   [address]  [place], [place] [address]                                              Services:  3D Mammogram and Bone Density  

## 2021-03-14 NOTE — Assessment & Plan Note (Signed)
Stable well controlled with diet and weight loss. No current indicaiton for statin. The 10-year ASCVD risk score (Arnett DK, et al., 2019) is: 2.1%   Values used to calculate the score:     Age: 60 years     Sex: Female     Is Non-Hispanic African American: No     Diabetic: No     Tobacco smoker: No     Systolic Blood Pressure: 001 mmHg     Is BP treated: No     HDL Cholesterol: 65.7 mg/dL     Total Cholesterol: 182 mg/dL

## 2021-03-14 NOTE — Assessment & Plan Note (Signed)
Follwoed by Dr. Estanislado Pandy Rheum.. on methotrexate and humira

## 2021-03-14 NOTE — Assessment & Plan Note (Signed)
Stable control. 

## 2021-03-14 NOTE — Assessment & Plan Note (Signed)
Encouraged exercise, weight loss, healthy eating habits. ? ?

## 2021-03-14 NOTE — Progress Notes (Signed)
Patient ID: Amy Hart, female    DOB: June 21, 1960, 60 y.o.   MRN: [id number]  This visit was conducted in person.  Temp 98.2 F (36.8 C) (Temporal)   Ht 5' 3.5" (1.613 m)   Wt 192 lb 2 oz (87.1 kg)   SpO2 96%   BMI 33.50 kg/m    CC:  Chief Complaint  Patient presents with   Annual Exam    Subjective:   HPI: Amy Hart is a 60 y.o. female presenting on 03/14/2021 for Annual Exam  Reviewed labs in detail.  Elevated Cholesterol: Lab Results  Component Value Date   CHOL 182 03/07/2021   HDL 65.70 03/07/2021   LDLCALC 94 03/07/2021   LDLDIRECT 131.3 11/25/2011   TRIG 109.0 03/07/2021   CHOLHDL 3 03/07/2021   The 10-year ASCVD risk score (Arnett DK, et al., 2019) is: 2.1%   Values used to calculate the score:     Age: 65 years     Sex: Female     Is Non-Hispanic African American: No     Diabetic: No     Tobacco smoker: No     Systolic Blood Pressure: 389 mmHg     Is BP treated: No     HDL Cholesterol: 65.7 mg/dL     Total Cholesterol: 182 mg/dL  Using medications without problems:none Muscle aches:  none Diet compliance:  using Myfitness pal 1400 calories a day Exercise: 10,000 steps a day has recently had flare of plantar fasciitis Other complaints:  Prediabetes, resolved Lab Results  Component Value Date   HGBA1C 5.5 03/07/2021    Hypothyroid  good control. Lab Results  Component Value Date   TSH 1.19 03/07/2021    RA:  On methotrexate, Humira. Followed by Dr, Estanislado Pandy Rheum.   Suing tramadol 50 mg as needed.. # 30 in 1 year.  Currently has flare of plantar fasciitis in right foot... doing ice and massage. Wt Readings from Last 3 Encounters:  03/14/21 192 lb 2 oz (87.1 kg)  11/13/20 193 lb (87.5 kg)  06/21/20 215 lb (97.5 kg)   Body mass index is 33.5 kg/m.      Relevant past medical, surgical, family and social history reviewed and updated as indicated. Interim medical history since our last visit reviewed. Allergies and  medications reviewed and updated. Outpatient Medications Prior to Visit  Medication Sig Dispense Refill   Cholecalciferol (VITAMIN D-3) 1000 units CAPS Take 1 capsule by mouth daily.     CRANBERRY PO Take 4,200 mg by mouth daily.     folic acid (FOLVITE) 1 MG tablet Take 2 mg by mouth daily.     HUMIRA PEN 40 MG/0.4ML PNKT INJECT 1 PEN UNDER THE SKIN EVERY 14 DAYS. 6 each 0   levothyroxine (SYNTHROID) 100 MCG tablet TAKE 1 TABLET BY MOUTH EVERY DAY 90 tablet 3   methotrexate (RHEUMATREX) 2.5 MG tablet TAKE 4 TABLETS (10 MG TOTAL) BY MOUTH ONCE A WEEK. 48 tablet 0   Multiple Vitamins-Minerals (MULTIVITAMIN ADULT PO) Take by mouth daily.     TURMERIC PO Take 1,000 mg by mouth daily.     vitamin C (ASCORBIC ACID) 500 MG tablet Take 500 mg by mouth daily.     folic acid (FOLVITE) 1 MG tablet Take 1 tablet (1 mg total) by mouth daily. (Patient taking differently: Take 2 mg by mouth daily.) 90 tablet 3   influenza vac split quadrivalent PF (FLUARIX) 0.5 ML injection Inject into the muscle. 0.5 mL 0  No facility-administered medications prior to visit.     Per HPI unless specifically indicated in ROS section below Review of Systems  Constitutional:  Negative for fatigue and fever.  HENT:  Negative for congestion.   Eyes:  Negative for pain.  Respiratory:  Negative for cough and shortness of breath.   Cardiovascular:  Negative for chest pain, palpitations and leg swelling.  Gastrointestinal:  Negative for abdominal pain.  Genitourinary:  Negative for dysuria and vaginal bleeding.  Musculoskeletal:  Negative for back pain.  Neurological:  Negative for syncope, light-headedness and headaches.  Psychiatric/Behavioral:  Negative for dysphoric mood.   Objective:  Temp 98.2 F (36.8 C) (Temporal)   Ht 5' 3.5" (1.613 m)   Wt 192 lb 2 oz (87.1 kg)   SpO2 96%   BMI 33.50 kg/m   Wt Readings from Last 3 Encounters:  03/14/21 192 lb 2 oz (87.1 kg)  11/13/20 193 lb (87.5 kg)  06/21/20 215 lb  (97.5 kg)      Physical Exam Vitals and nursing note reviewed.  Constitutional:      General: She is not in acute distress.    Appearance: Normal appearance. She is well-developed. She is not ill-appearing or toxic-appearing.  HENT:     Head: Normocephalic.     Right Ear: Hearing, tympanic membrane, ear canal and external ear normal.     Left Ear: Hearing, tympanic membrane, ear canal and external ear normal.     Nose: Nose normal.  Eyes:     General: Lids are normal. Lids are everted, no foreign bodies appreciated.     Conjunctiva/sclera: Conjunctivae normal.     Pupils: Pupils are equal, round, and reactive to light.  Neck:     Thyroid: No thyroid mass or thyromegaly.     Vascular: No carotid bruit.     Trachea: Trachea normal.  Cardiovascular:     Rate and Rhythm: Normal rate and regular rhythm.     Heart sounds: Normal heart sounds, S1 normal and S2 normal. No murmur heard.   No gallop.  Pulmonary:     Effort: Pulmonary effort is normal. No respiratory distress.     Breath sounds: Normal breath sounds. No wheezing, rhonchi or rales.  Abdominal:     General: Bowel sounds are normal. There is no distension or abdominal bruit.     Palpations: Abdomen is soft. There is no fluid wave or mass.     Tenderness: There is no abdominal tenderness. There is no guarding or rebound.     Hernia: No hernia is present.  Musculoskeletal:     Cervical back: Normal range of motion and neck supple.  Feet:     Comments: Ttp over insertion of right plantar fasciia Lymphadenopathy:     Cervical: No cervical adenopathy.  Skin:    General: Skin is warm and dry.     Findings: No rash.  Neurological:     Mental Status: She is alert.     Cranial Nerves: No cranial nerve deficit.     Sensory: No sensory deficit.  Psychiatric:        Mood and Affect: Mood is not anxious or depressed.        Speech: Speech normal.        Behavior: Behavior normal. Behavior is cooperative.        Judgment:  Judgment normal.      Results for orders placed or performed in visit on 03/07/21  Hemoglobin A1c  Result Value Ref Range  Hgb A1c MFr Bld 5.5 4.6 - 6.5 %  Lipid panel  Result Value Ref Range   Cholesterol 182 0 - 200 mg/dL   Triglycerides 109.0 0.0 - 149.0 mg/dL   HDL 65.70 >39.00 mg/dL   VLDL 21.8 0.0 - 40.0 mg/dL   LDL Cholesterol 94 0 - 99 mg/dL   Total CHOL/HDL Ratio 3    NonHDL 116.21   Comprehensive metabolic panel  Result Value Ref Range   Sodium 140 135 - 145 mEq/L   Potassium 4.7 3.5 - 5.1 mEq/L   Chloride 104 96 - 112 mEq/L   CO2 32 19 - 32 mEq/L   Glucose, Bld 97 70 - 99 mg/dL   BUN 12 6 - 23 mg/dL   Creatinine, Ser 0.87 0.40 - 1.20 mg/dL   Total Bilirubin 0.8 0.2 - 1.2 mg/dL   Alkaline Phosphatase 66 39 - 117 U/L   AST 19 0 - 37 U/L   ALT 17 0 - 35 U/L   Total Protein 6.8 6.0 - 8.3 g/dL   Albumin 4.3 3.5 - 5.2 g/dL   GFR 72.68 >60.00 mL/min   Calcium 9.9 8.4 - 10.5 mg/dL  TSH  Result Value Ref Range   TSH 1.19 0.35 - 5.50 uIU/mL  T4, free  Result Value Ref Range   Free T4 0.80 0.60 - 1.60 ng/dL  T3, free  Result Value Ref Range   T3, Free 2.9 2.3 - 4.2 pg/mL    This visit occurred during the SARS-CoV-2 public health emergency.  Safety protocols were in place, including screening questions prior to the visit, additional usage of staff PPE, and extensive cleaning of exam room while observing appropriate contact time as indicated for disinfecting solutions.   COVID 19 screen:  No recent travel or known exposure to COVID19 The patient denies respiratory symptoms of COVID 19 at this time. The importance of social distancing was discussed today.   Assessment and Plan The patient's preventative maintenance and recommended screening tests for an annual wellness exam were reviewed in full today. Brought up to date unless services declined.  Counselled on the importance of diet, exercise, and its role in overall health and mortality. The patient's FH and SH  was reviewed, including their home life, tobacco status, and drug and alcohol status.   Vaccines: flu, PCV 13 uptodate, PCV23 2018 and PCV 23 in 5 years, S/P COVID x 3  Nonsmoker.   PAP/DVE: Q 5year, last done 03/2019 nml , no HPV , DVE not indicated.. Asymptomatic.  Mammo: 03/2020, q2 years no family history for breast cancer... due in Dec. Colon: 01/2012 Dr. Carlean Purl, nml repeat in 10 years  STD screening: refused  Hep C : done   DEXA: consider  age 16, history of  prednisone use.  Problem List Items Addressed This Visit     Class 1 obesity without serious comorbidity with body mass index (BMI) of 33.0 to 33.9 in adult    Encouraged exercise, weight loss, healthy eating habits.       Hypothyroidism    Stable, chronic.  Continue current medication.    levo 100 mcg daily      Plantar fasciitis    Add ice massage and theraband stretching if not improving in next 2 weeks start low dose prednisone taper.      Prediabetes    Stable control      Pure hypercholesterolemia    Stable well controlled with diet and weight loss. No current indicaiton for statin. The 10-year  ASCVD risk score (Arnett DK, et al., 2019) is: 2.1%   Values used to calculate the score:     Age: 46 years     Sex: Female     Is Non-Hispanic African American: No     Diabetic: No     Tobacco smoker: No     Systolic Blood Pressure: 169 mmHg     Is BP treated: No     HDL Cholesterol: 65.7 mg/dL     Total Cholesterol: 182 mg/dL       Rheumatoid arthritis ([place])    Follwoed by Dr. Estanislado Pandy Rheum.. on methotrexate and humira      Relevant Medications   predniSONE (DELTASONE) 10 MG tablet   traMADol (ULTRAM) 50 MG tablet   Other Visit Diagnoses     Routine general medical examination at a health care facility    -  Primary      Eliezer Lofts, MD

## 2021-03-28 ENCOUNTER — Encounter: Payer: Self-pay | Admitting: Family Medicine

## 2021-03-28 DIAGNOSIS — Z1231 Encounter for screening mammogram for malignant neoplasm of breast: Secondary | ICD-10-CM | POA: Diagnosis not present

## 2021-03-28 LAB — HM MAMMOGRAPHY

## 2021-04-04 NOTE — Progress Notes (Signed)
Office Visit Note  Patient: Amy Hart             Date of Birth: [birth year]           MRN: [id number]             PCP: Jinny Sanders, MD Referring: Jinny Sanders, MD Visit Date: 04/18/2021 Occupation: @GUAROCC @  Subjective:  Medication management  History of Present Illness: AMELA HANDLEY is a 60 y.o. female with a history of seropositive rheumatoid arthritis and osteoarthritis.  She states she is doing quite well on the combination of Humira and methotrexate.  She states she recently developed plantar fasciitis in her right foot for which she was seen by her PCP.  She was given a prednisone taper and also was referred to physical therapist.  Her symptoms are gradually improving.  She has not had a flare of rheumatoid arthritis in a while.  Although she can tell that its time for her to take her next dose of Humira.  Activities of Daily Living:  Patient reports morning stiffness for 15 minutes.   Patient Denies nocturnal pain.  Difficulty dressing/grooming: Denies Difficulty climbing stairs: Denies Difficulty getting out of chair: Denies Difficulty using hands for taps, buttons, cutlery, and/or writing: Reports  Review of Systems  Constitutional:  Negative for fatigue.  HENT:  Negative for mouth dryness.   Eyes:  Negative for dryness.  Respiratory:  Negative for shortness of breath.   Cardiovascular:  Negative for swelling in legs/feet.  Gastrointestinal:  Negative for constipation.  Endocrine: Negative for excessive thirst.  Genitourinary:  Negative for difficulty urinating.  Musculoskeletal:  Positive for joint pain, joint pain and morning stiffness. Negative for joint swelling.  Skin:  Negative for color change, rash and sensitivity to sunlight.  Allergic/Immunologic: Negative for susceptible to infections.  Neurological:  Positive for numbness.  Hematological:  Negative for bruising/bleeding tendency.  Psychiatric/Behavioral:  Negative for depressed mood and sleep  disturbance. The patient is not nervous/anxious.    PMFS History:  Patient Active Problem List   Diagnosis Date Noted   Plantar fasciitis 03/14/2021   Class 1 obesity without serious comorbidity with body mass index (BMI) of 33.0 to 33.9 in adult 03/09/2020   Prediabetes 01/08/2017   Epistaxis, recurrent 12/05/2014   Menopausal syndrome 12/05/2014   Rheumatoid arthritis ([place]) 01/02/2012   Pure hypercholesterolemia 08/29/2009   Osteoarthritis, hands 08/24/2009   ROTATOR CUFF SYNDROME 08/04/2007   Hypothyroidism 05/19/2007   ALLERGIC RHINITIS, SEASONAL 05/19/2007    Past Medical History:  Diagnosis Date   Allergic rhinitis due to pollen    Collagen vascular disease (HCC)    Plantar fasciitis of right foot    Rheumatoid arthritis(714.0)    Unspecified hypothyroidism     Family History  Problem Relation Age of Onset   Hypertension Mother    Thyroid disease Mother    Nephrolithiasis Mother    Diabetes Father    Kidney Stones Sister    Past Surgical History:  Procedure Laterality Date   BREAST BIOPSY  2003   right, negative   ENDOMETRIAL ABLATION  2005   FRACTURE SURGERY  01/01   right elbow   ROTATOR CUFF REPAIR  06/2008   R. Cuff tear   TAYLOR BUNIONECTOMY     right   TREATMENT FISTULA ANAL  1993   repair   Social History   Social History Narrative   No exercise   Diet: Moderate   She has a Sales promotion account executive  Lives with her partner   Immunization History  Administered Date(s) Administered   Influenza Inj Mdck Quad Pf 01/09/2018   Influenza,inj,Quad PF,6+ Mos 12/07/2015, 01/09/2018, 01/19/2019, 01/24/2020, 01/16/2021   Influenza-Unspecified 01/10/2015, 01/08/2017   PFIZER(Purple Top)SARS-COV-2 Vaccination 06/17/2019, 07/12/2019, 01/27/2020, 07/27/2020   Pfizer Covid-19 Vaccine Bivalent Booster 55yrs & up 01/25/2021   Pneumococcal Conjugate-13 12/07/2015   Pneumococcal Polysaccharide-23 01/08/2017   Td 02/05/1998, 07/13/2008   Tdap 03/08/2019   Zoster  Recombinat (Shingrix) 03/08/2019, 05/10/2019   Zoster, Live 09/09/2013     Objective: Vital Signs: BP 113/75 (BP Location: Left Arm, Patient Position: Sitting, Cuff Size: Small)    Pulse 76    Resp 12    Ht 5\' 4"  (1.626 m)    Wt 193 lb (87.5 kg)    BMI 33.13 kg/m    Physical Exam Vitals and nursing note reviewed.  Constitutional:      Appearance: She is well-developed.  HENT:     Head: Normocephalic and atraumatic.  Eyes:     Conjunctiva/sclera: Conjunctivae normal.  Cardiovascular:     Rate and Rhythm: Normal rate and regular rhythm.     Heart sounds: Normal heart sounds.  Pulmonary:     Effort: Pulmonary effort is normal.     Breath sounds: Normal breath sounds.  Abdominal:     General: Bowel sounds are normal.     Palpations: Abdomen is soft.  Musculoskeletal:     Cervical back: Normal range of motion.  Lymphadenopathy:     Cervical: No cervical adenopathy.  Skin:    General: Skin is warm and dry.     Capillary Refill: Capillary refill takes less than 2 seconds.  Neurological:     Mental Status: She is alert and oriented to person, place, and time.  Psychiatric:        Behavior: Behavior normal.     Musculoskeletal Exam: C-spine thoracic and lumbar spine with good range of motion.  Shoulder joints, elbow joints, wrist joints, MCPs PIPs and DIPs with good range of motion.  She has contracture in her right elbow joint which is unchanged without any synovitis.  She has thickening and contraction of bilateral fifth PIP joints.  Hip joints and knee joints with good range of motion.  She had no tenderness over ankles or MTPs.  CDAI Exam: CDAI Score: 0.4  Patient Global: 2 mm; Provider Global: 2 mm Swollen: 0 ; Tender: 0  Joint Exam 04/18/2021   No joint exam has been documented for this visit   There is currently no information documented on the homunculus. Go to the Rheumatology activity and complete the homunculus joint exam.  Investigation: No additional  findings.  Imaging: No results found.  Recent Labs: Lab Results  Component Value Date   WBC 4.7 01/07/2021   HGB 14.4 01/07/2021   PLT 142 01/07/2021   NA 140 03/07/2021   K 4.7 03/07/2021   CL 104 03/07/2021   CO2 32 03/07/2021   GLUCOSE 97 03/07/2021   BUN 12 03/07/2021   CREATININE 0.87 03/07/2021   BILITOT 0.8 03/07/2021   ALKPHOS 66 03/07/2021   AST 19 03/07/2021   ALT 17 03/07/2021   PROT 6.8 03/07/2021   ALBUMIN 4.3 03/07/2021   CALCIUM 9.9 03/07/2021   GFRAA 89 09/12/2020   QFTBGOLDPLUS NEGATIVE 01/07/2021    Speciality Comments: No specialty comments available.  Procedures:  No procedures performed Allergies: Naproxen   Assessment / Plan:     Visit Diagnoses: Rheumatoid arthritis involving multiple sites with  positive rheumatoid factor (HCC)-patient had no synovitis on examination.  She has been tolerating medications well.  She states that she develops some discomfort in her joints right before the Humira injection.  High risk medication use - Humira 40 mg sq injection every 14 days, methotrexate 2.5 mg 4 tablets every 7 days, and folic acid 1 mg 1 tablet daily.  -Labs from January 07, 2021 showed normal CBC.  CMP was normal on March 07, 2021.  I will check CBC today.  Plan: CBC with Differential/Platelet.  She has been advised to get labs every 3 months to monitor for drug toxicity.  Information regarding mineralizations was placed in the AVS.  She was also advised to hold Humira and methotrexate if she develops an infection and resume after the infection resolves.  She was advised to get annual skin examination to screen for skin cancer while she is on Humira.  Primary osteoarthritis of both hands-she has osteoarthritis in her hands and has chronic contractures in her bilateral fifth PIP joints.  No synovitis was noted.  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 Unchanged.    Plantar fasciitis of right foot-she had recent right plantar  fasciitis.  She is going to physical therapy.  She relates to an injury.  She was given prednisone by her PCP.  Prediabetes-patient states that her hemoglobin A1c has improved.  She has done some dietary modifications.  History of hypothyroidism  History of hematuria  History of kidney stones-no recent recurrence.  Orders: Orders Placed This Encounter  Procedures   CBC with Differential/Platelet   No orders of the defined types were placed in this encounter.    Follow-Up Instructions: Return in about 5 months (around 09/16/2021) for Rheumatoid arthritis.   Bo Merino, MD  Note - This record has been created using Editor, commissioning.  Chart creation errors have been sought, but may not always  have been located. Such creation errors do not reflect on  the standard of medical care.

## 2021-04-11 ENCOUNTER — Other Ambulatory Visit: Payer: Self-pay | Admitting: Family Medicine

## 2021-04-11 ENCOUNTER — Other Ambulatory Visit: Payer: Self-pay | Admitting: Physician Assistant

## 2021-04-12 NOTE — Telephone Encounter (Signed)
Next Visit: 04/18/2021   Last Visit: 11/13/2020   Last Fill: 01/07/2021 (30 day supply)    DX: Rheumatoid arthritis involving multiple sites with positive rheumatoid factor    Current Dose per office note 11/13/2020: methotrexate 2.5 mg 4 tablets every 7 days   Labs: 01/07/2021 CBC stable. 03/07/2021 CMP WNL   Okay to refill MTX?

## 2021-04-18 ENCOUNTER — Other Ambulatory Visit: Payer: Self-pay

## 2021-04-18 ENCOUNTER — Ambulatory Visit (INDEPENDENT_AMBULATORY_CARE_PROVIDER_SITE_OTHER): Payer: BC Managed Care – PPO | Admitting: Rheumatology

## 2021-04-18 ENCOUNTER — Encounter: Payer: Self-pay | Admitting: Rheumatology

## 2021-04-18 VITALS — BP 113/75 | HR 76 | Resp 12 | Ht 64.0 in | Wt 193.0 lb

## 2021-04-18 DIAGNOSIS — Z87442 Personal history of urinary calculi: Secondary | ICD-10-CM

## 2021-04-18 DIAGNOSIS — Z79899 Other long term (current) drug therapy: Secondary | ICD-10-CM

## 2021-04-18 DIAGNOSIS — M19042 Primary osteoarthritis, left hand: Secondary | ICD-10-CM

## 2021-04-18 DIAGNOSIS — M24521 Contracture, right elbow: Secondary | ICD-10-CM | POA: Diagnosis not present

## 2021-04-18 DIAGNOSIS — M0579 Rheumatoid arthritis with rheumatoid factor of multiple sites without organ or systems involvement: Secondary | ICD-10-CM

## 2021-04-18 DIAGNOSIS — M19041 Primary osteoarthritis, right hand: Secondary | ICD-10-CM

## 2021-04-18 DIAGNOSIS — Z8639 Personal history of other endocrine, nutritional and metabolic disease: Secondary | ICD-10-CM

## 2021-04-18 DIAGNOSIS — R7303 Prediabetes: Secondary | ICD-10-CM

## 2021-04-18 DIAGNOSIS — Z87448 Personal history of other diseases of urinary system: Secondary | ICD-10-CM

## 2021-04-18 DIAGNOSIS — M722 Plantar fascial fibromatosis: Secondary | ICD-10-CM

## 2021-04-18 LAB — CBC WITH DIFFERENTIAL/PLATELET
Absolute Monocytes: 503 cells/uL (ref 200–950)
Basophils Absolute: 34 cells/uL (ref 0–200)
Basophils Relative: 0.5 %
Eosinophils Absolute: 60 cells/uL (ref 15–500)
Eosinophils Relative: 0.9 %
HCT: 41.9 % (ref 35.0–45.0)
Hemoglobin: 14.3 g/dL (ref 11.7–15.5)
Lymphs Abs: 2057 cells/uL (ref 850–3900)
MCH: 33.9 pg — ABNORMAL HIGH (ref 27.0–33.0)
MCHC: 34.1 g/dL (ref 32.0–36.0)
MCV: 99.3 fL (ref 80.0–100.0)
MPV: 12.3 fL (ref 7.5–12.5)
Monocytes Relative: 7.5 %
Neutro Abs: 4047 cells/uL (ref 1500–7800)
Neutrophils Relative %: 60.4 %
Platelets: 196 10*3/uL (ref 140–400)
RBC: 4.22 10*6/uL (ref 3.80–5.10)
RDW: 13 % (ref 11.0–15.0)
Total Lymphocyte: 30.7 %
WBC: 6.7 10*3/uL (ref 3.8–10.8)

## 2021-04-18 NOTE — Patient Instructions (Signed)
Standing Labs We placed an order today for your standing lab work.   Please have your standing labs drawn in April and every 3 months  If possible, please have your labs drawn 2 weeks prior to your appointment so that the provider can discuss your results at your appointment.  Please note that you may see your imaging and lab results in [place] before we have reviewed them. We may be awaiting multiple results to interpret others before contacting you. Please allow our office up to 72 hours to thoroughly review all of the results before contacting the office for clarification of your results.  We have open lab daily: Monday through Thursday from 1:30-4:30 PM and Friday from 1:30-4:00 PM at the office of Dr. Bo Merino, [facility].   Please be advised, all patients with office appointments requiring lab work will take precedent over walk-in lab work.  If possible, please come for your lab work on Monday and Friday afternoons, as you may experience shorter wait times. The office is located at [address], [place], [place], [place] [address] No appointment is necessary.   Labs are drawn by [facility]. Please bring your co-pay at the time of your lab draw.  You may receive a bill from [facility] for your lab work.  If you wish to have your labs drawn at another location, please call the office 24 hours in advance to send orders.  If you have any questions regarding directions or hours of operation,  please call [phone].   As a reminder, please drink plenty of water prior to coming for your lab work. Thanks!   Vaccines You are taking a medication(s) that can suppress your immune system.  The following immunizations are recommended: Flu annually Covid-19  Td/Tdap (tetanus, diphtheria, pertussis) every 10 years Pneumonia (Prevnar 15 then Pneumovax 23 at least 1 year apart.  Alternatively, can take Prevnar 20 without needing additional dose) Shingrix: 2 doses from 4 weeks  to 6 months apart  Please check with your PCP to make sure you are up to date.   If you have signs or symptoms of an infection or start antibiotics: First, call your PCP for workup of your infection. Hold your medication through the infection, until you complete your antibiotics, and until symptoms resolve if you take the following: Injectable medication (Actemra, Benlysta, Cimzia, Cosentyx, Enbrel, Humira, Kevzara, Orencia, Remicade, Simponi, Stelara, Taltz, Tremfya) Methotrexate Leflunomide (Arava) Mycophenolate (Cellcept) Morrie Sheldon, Olumiant, or Rinvoq  Please get annual skin examination to screen for skin cancer while you are on Humira.

## 2021-05-07 ENCOUNTER — Other Ambulatory Visit: Payer: Self-pay

## 2021-05-07 ENCOUNTER — Ambulatory Visit (INDEPENDENT_AMBULATORY_CARE_PROVIDER_SITE_OTHER): Payer: BC Managed Care – PPO

## 2021-05-07 ENCOUNTER — Ambulatory Visit (INDEPENDENT_AMBULATORY_CARE_PROVIDER_SITE_OTHER): Payer: BC Managed Care – PPO | Admitting: Podiatry

## 2021-05-07 VITALS — BP 113/66 | HR 63 | Temp 97.9°F | Resp 16

## 2021-05-07 DIAGNOSIS — M67471 Ganglion, right ankle and foot: Secondary | ICD-10-CM

## 2021-05-07 DIAGNOSIS — M722 Plantar fascial fibromatosis: Secondary | ICD-10-CM

## 2021-05-07 MED ORDER — MELOXICAM 15 MG PO TABS: 15.0000 mg | ORAL_TABLET | Freq: Every day | ORAL | 1 refills | Status: DC

## 2021-05-07 NOTE — Progress Notes (Signed)
° °  HPI: 61 y.o. female presenting today as a new patient for evaluation of a symptomatic nodule to the distal plantar arch of the right foot just proximal to the sesamoid apparatus.  Patient states that a few months ago she noticed some pain and heard an audible pop to the foot.  He was at this time that she noticed the symptomatic nodular lesion.  She says it has gotten smaller in size of the past month or 2.  She presents for further treatment and evaluation  Past Medical History:  Diagnosis Date   Allergic rhinitis due to pollen    Collagen vascular disease ([place])    Plantar fasciitis of right foot    Rheumatoid arthritis(714.0)    Unspecified hypothyroidism     Past Surgical History:  Procedure Laterality Date   BREAST BIOPSY  2003   right, negative   ENDOMETRIAL ABLATION  2005   FRACTURE SURGERY  01/01   right elbow   ROTATOR CUFF REPAIR  06/2008   R. Cuff tear   TAYLOR BUNIONECTOMY     right   TREATMENT FISTULA ANAL  1993   repair    Allergies  Allergen Reactions   Naproxen     REACTION: unspecified     Physical Exam: General: The patient is alert and oriented x3 in no acute distress.  Dermatology: Skin is warm, dry and supple bilateral lower extremities. Negative for open lesions or macerations.  Vascular: Palpable pedal pulses bilaterally. Capillary refill within normal limits.  Negative for any significant edema or erythema  Neurological: Light touch and protective threshold grossly intact  Musculoskeletal Exam: No pedal deformities noted.  There is a palpable knot adhered nodular lesion just deep to the skin about 1 cm in diameter.  Again, this nodular lesion is nonadherent and malleable and movable.  Radiographic Exam:  Normal osseous mineralization. Joint spaces preserved. No fracture/dislocation/boney destruction.    Assessment: 1.  Probable ganglion cyst right plantar foot   Plan of Care:  1. Patient evaluated. X-Rays reviewed.  2.  I explained the  patient that this is likely a ganglion cyst even though more traditionally they are on the dorsum of the foot.  The fact that it has gotten smaller with size of the last month or 2 we are simply going to consider continued conservative treatment 3.  Prescription for meloxicam 15 mg daily 4.  Continue wearing good supportive shoes and sneakers that do not irritate or aggravate the area 5.  Return to clinic in 3 months, if the nodular lesion is still there we may need to discuss possible excision performed in the office      Edrick Kins, DPM [facility]  Dr. Edrick Kins, DPM    [address], [place] [address]                Office [phone]  Fax [phone]

## 2021-05-31 ENCOUNTER — Other Ambulatory Visit: Payer: Self-pay

## 2021-05-31 ENCOUNTER — Other Ambulatory Visit: Payer: Self-pay | Admitting: Family Medicine

## 2021-05-31 MED ORDER — FOLIC ACID 1 MG PO TABS
2.0000 mg | ORAL_TABLET | Freq: Every day | ORAL | 3 refills | Status: DC
  Filled 2021-05-31 – 2021-06-02 (×2): qty 180, 90d supply, fill #0

## 2021-06-03 ENCOUNTER — Other Ambulatory Visit: Payer: Self-pay

## 2021-06-05 DIAGNOSIS — U071 COVID-19: Secondary | ICD-10-CM

## 2021-06-05 HISTORY — DX: COVID-19: U07.1

## 2021-06-12 ENCOUNTER — Other Ambulatory Visit: Payer: Self-pay

## 2021-06-19 ENCOUNTER — Encounter: Payer: Self-pay | Admitting: Rheumatology

## 2021-06-21 ENCOUNTER — Other Ambulatory Visit: Payer: Self-pay

## 2021-06-21 DIAGNOSIS — Z79899 Other long term (current) drug therapy: Secondary | ICD-10-CM

## 2021-06-22 LAB — COMPLETE METABOLIC PANEL WITH GFR
AG Ratio: 1.7 (calc) (ref 1.0–2.5)
ALT: 28 U/L (ref 6–29)
AST: 31 U/L (ref 10–35)
Albumin: 4.3 g/dL (ref 3.6–5.1)
Alkaline phosphatase (APISO): 70 U/L (ref 37–153)
BUN: 18 mg/dL (ref 7–25)
CO2: 29 mmol/L (ref 20–32)
Calcium: 9.7 mg/dL (ref 8.6–10.4)
Chloride: 104 mmol/L (ref 98–110)
Creat: 0.87 mg/dL (ref 0.50–1.05)
Globulin: 2.5 g/dL (calc) (ref 1.9–3.7)
Glucose, Bld: 95 mg/dL (ref 65–139)
Potassium: 5.2 mmol/L (ref 3.5–5.3)
Sodium: 140 mmol/L (ref 135–146)
Total Bilirubin: 0.6 mg/dL (ref 0.2–1.2)
Total Protein: 6.8 g/dL (ref 6.1–8.1)
eGFR: 76 mL/min/{1.73_m2} (ref >=60)

## 2021-06-22 LAB — CBC WITH DIFFERENTIAL/PLATELET
Absolute Monocytes: 428 cells/uL (ref 200–950)
Basophils Absolute: 31 cells/uL (ref 0–200)
Basophils Relative: 0.6 %
Eosinophils Absolute: 102 cells/uL (ref 15–500)
Eosinophils Relative: 2 %
HCT: 42.6 % (ref 35.0–45.0)
Hemoglobin: 14.4 g/dL (ref 11.7–15.5)
Lymphs Abs: 1841 cells/uL (ref 850–3900)
MCH: 33.3 pg — ABNORMAL HIGH (ref 27.0–33.0)
MCHC: 33.8 g/dL (ref 32.0–36.0)
MCV: 98.6 fL (ref 80.0–100.0)
MPV: 12.7 fL — ABNORMAL HIGH (ref 7.5–12.5)
Monocytes Relative: 8.4 %
Neutro Abs: 2698 cells/uL (ref 1500–7800)
Neutrophils Relative %: 52.9 %
Platelets: 173 10*3/uL (ref 140–400)
RBC: 4.32 10*6/uL (ref 3.80–5.10)
RDW: 12.3 % (ref 11.0–15.0)
Total Lymphocyte: 36.1 %
WBC: 5.1 10*3/uL (ref 3.8–10.8)

## 2021-06-24 NOTE — Progress Notes (Signed)
CBC and CMP WNL

## 2021-06-30 ENCOUNTER — Other Ambulatory Visit: Payer: Self-pay | Admitting: Podiatry

## 2021-07-02 ENCOUNTER — Telehealth: Payer: Self-pay

## 2021-07-02 ENCOUNTER — Other Ambulatory Visit: Payer: Self-pay | Admitting: Rheumatology

## 2021-07-02 NOTE — Telephone Encounter (Signed)
I called patient, patient verbalized understanding. 

## 2021-07-02 NOTE — Telephone Encounter (Signed)
Patient called stating she tested positive for Covid today 07/02/21 (took 2 home tests).  Patient states she was exposed on a business trip, but doesn't have any symptoms.  Patient requested a return call to let her know if she needs to discontinue her Humira or Methotrexate medication.   ?

## 2021-07-02 NOTE — Telephone Encounter (Signed)
Patient should stop Humira and methotrexate.  She should contact her PCP to start Paxlovid.  She may resume both medications 2 weeks after she becomes asymptomatic.

## 2021-07-02 NOTE — Telephone Encounter (Signed)
Next Visit: 09/19/2021 ? ?Last Visit: 04/18/2021 ? ?Last Fill: 04/12/2021 ? ?DX: Rheumatoid arthritis involving multiple sites with positive rheumatoid factor ? ?Current Dose per office note 04/18/2021: methotrexate 2.5 mg 4 tablets every 7 days ? ?Labs: 06/21/2021 CBC and CMP WNL.  ? ?Okay to refill MTX?  ?

## 2021-07-03 ENCOUNTER — Telehealth (INDEPENDENT_AMBULATORY_CARE_PROVIDER_SITE_OTHER): Payer: BC Managed Care – PPO | Admitting: Family

## 2021-07-03 ENCOUNTER — Encounter: Payer: Self-pay | Admitting: Family Medicine

## 2021-07-03 ENCOUNTER — Encounter: Payer: Self-pay | Admitting: Family

## 2021-07-03 VITALS — Temp 98.7°F | Ht 64.0 in | Wt 190.0 lb

## 2021-07-03 DIAGNOSIS — U071 COVID-19: Secondary | ICD-10-CM | POA: Diagnosis not present

## 2021-07-03 MED ORDER — MOLNUPIRAVIR EUA 200MG CAPSULE: 4.0000 | ORAL_CAPSULE | Freq: Two times a day (BID) | ORAL | 0 refills | Status: AC

## 2021-07-03 NOTE — Progress Notes (Signed)
? ? ?Virtual telephone visit ? ? ? ?Virtual Visit via Telephone Note  ? ?This visit type was conducted due to national recommendations for restrictions regarding the COVID-19 Pandemic (e.g. social distancing) in an effort to limit this Amy Hart's exposure and mitigate transmission in our community. Due to her co-morbid illnesses, this Amy Hart is at least at moderate risk for complications without adequate follow up. This format is felt to be most appropriate for this Amy Hart at this time. The Amy Hart did not have access to video technology or had technical difficulties with video requiring transitioning to audio format only (telephone). Physical exam was limited to content and character of the telephone converstion. CMA was able to get the Amy Hart set up on a telephone visit. ? ? ?Amy Hart location: Home. Amy Hart and provider in visit ?Provider location: Office ? ?I discussed the limitations of evaluation and management by telemedicine and the availability of in person appointments. The Amy Hart expressed understanding and agreed to proceed.  ? ?Visit Date: 07/03/2021 ? ?Today's healthcare provider: Eugenia Pancoast, FNP  ? ? ? ?Subjective:  ? ? Amy Hart ID: Amy Hart, female    DOB: 05/03/1960, 61 y.o.   MRN: [id number] ? ?Chief Complaint  ?Amy Hart presents with  ? Covid Positive  ?  Positive covid test 3/28 tested due to exposure at work trip. No symptoms.   ? ? ?HPI ? ?Pt has not had any symptoms however tested yesterday 3/28 and was Covid positive. Amy Hart tested after Amy Hart found out Monday that two of her coworkers had tested positive. Did confirm with two different tests.  ? ?Her concern is that her wife has kidney disease so Amy Hart is higher risk.  ? ?Amy Hart is obese and also with rheumatoid arthritis as well as over 60 so Amy Hart is considered high risk.  ?Amy Hart did let her rheumatologist know that Amy Hart needed to d/c her humira and also her methotrexate for the next two weeks. Last dose was march 19th.  ? ?Past Medical History:   ?Diagnosis Date  ? Allergic rhinitis due to pollen   ? Collagen vascular disease ([place])   ? Plantar fasciitis of right foot   ? Rheumatoid arthritis(714.0)   ? Unspecified hypothyroidism   ? ? ?Past Surgical History:  ?Procedure Laterality Date  ? BREAST BIOPSY  2003  ? right, negative  ? ENDOMETRIAL ABLATION  2005  ? FRACTURE SURGERY  01/01  ? right elbow  ? ROTATOR CUFF REPAIR  06/2008  ? R. Cuff tear  ? TAYLOR BUNIONECTOMY    ? right  ? TREATMENT FISTULA ANAL  1993  ? repair  ? ? ?Family History  ?Problem Relation Age of Onset  ? Hypertension Mother   ? Thyroid disease Mother   ? Nephrolithiasis Mother   ? Diabetes Father   ? Kidney Stones Sister   ? ? ?Social History  ? ?Socioeconomic History  ? Marital status: Married  ?  Spouse name: Not on file  ? Number of children: 0  ? Years of education: Not on file  ? Highest education level: Not on file  ?Occupational History  ? Occupation: Geologist, engineering at [employer]  ?  Employer: [employer]  ?Tobacco Use  ? Smoking status: Never  ? Smokeless tobacco: Never  ?Vaping Use  ? Vaping Use: Never used  ?Substance and Sexual Activity  ? Alcohol use: Yes  ?  Alcohol/week: 2.0 standard drinks  ?  Types: 2 Standard drinks or equivalent per week  ?  Comment: occ  ?  Drug use: No  ? Sexual activity: Not on file  ?Other Topics Concern  ? Not on file  ?Social History Narrative  ? No exercise  ? Diet: Moderate  ? 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 Lives with her partner  ? ?Social Determinants of Health  ? ?Financial Resource Strain: Not on file  ?Food Insecurity: Not on file  ?Transportation Needs: Not on file  ?Physical Activity: Not on file  ?Stress: Not on file  ?Social Connections: Not on file  ?Intimate Partner Violence: Not on file  ? ? ?Outpatient Medications Prior to Visit  ?Medication Sig Dispense Refill  ? Cholecalciferol (VITAMIN D-3) 1000 units CAPS Take 1 capsule by mouth daily.    ? CRANBERRY PO Take 4,200 mg by mouth daily.    ? folic acid (FOLVITE)  1 MG tablet Take 2 tablets (2 mg total) by mouth daily. 180 tablet 3  ? levothyroxine (SYNTHROID) 100 MCG tablet TAKE 1 TABLET BY MOUTH EVERY DAY 90 tablet 3  ? meloxicam (MOBIC) 15 MG tablet Take 1 tablet (15 mg total) by mouth daily. 30 tablet 1  ? methotrexate (RHEUMATREX) 2.5 MG tablet TAKE 4 TABLETS (10 MG TOTAL) BY MOUTH ONCE A WEEK (Amy Hart not taking: Reported on 07/03/2021) 48 tablet 0  ? Multiple Vitamins-Minerals (MULTIVITAMIN ADULT PO) Take by mouth daily.    ? TURMERIC PO Take 1,000 mg by mouth daily.    ? vitamin C (ASCORBIC ACID) 500 MG tablet Take 500 mg by mouth daily.    ? HUMIRA PEN 40 MG/0.4ML PNKT INJECT 1 PEN UNDER THE SKIN EVERY 14 DAYS. (Amy Hart not taking: Reported on 07/03/2021) 6 each 0  ? predniSONE (DELTASONE) 10 MG tablet 2 tablets daily x 5 days, then 1 tablet daily x 5 days then 1/2 tablet daily x 5 days 20 tablet 0  ? ?No facility-administered medications prior to visit.  ? ? ?Allergies  ?Allergen Reactions  ? Naproxen   ?  REACTION: unspecified  ? ? ?Review of Systems  ?Constitutional:  Negative for chills and fever.  ?HENT:  Negative for congestion, ear pain and sore throat.   ?Respiratory:  Negative for cough.   ?Cardiovascular:  Negative for chest pain.  ?All other systems reviewed and are negative. ? ?   ?Objective:  ?  ?Physical Exam ?Constitutional:   ?   Appearance: Normal appearance. Amy Hart is obese.  ?Pulmonary:  ?   Effort: Pulmonary effort is normal.  ?Neurological:  ?   Mental Status: Amy Hart is alert and oriented to person, place, and time.  ?Psychiatric:     ?   Mood and Affect: Mood normal.     ?   Behavior: Behavior normal.     ?   Thought Content: Thought content normal.     ?   Judgment: Judgment normal.  ? ? ?Temp 98.7 ?F (37.1 ?C) (Oral)   Ht '5\' 4"'$  (1.626 m)   Wt 190 lb (86.2 kg)   BMI 32.61 kg/m?  ?Wt Readings from Last 3 Encounters:  ?07/03/21 190 lb (86.2 kg)  ?04/18/21 193 lb (87.5 kg)  ?03/14/21 192 lb 2 oz (87.1 kg)  ? ? ? ?   ?Assessment & Plan:  ? ?Problem List  Items Addressed This Visit   ? ?  ? Other  ? COVID-19 - Primary  ?  Pt considered high risk due to obesity, over 60 and rheumatoid arthritis. Sent antiviral to pharmacy. ? ?Advised of CDC guidelines for self isolation/ ending  isolation.  Advised of safe practice guidelines. Symptom Tier reviewed.  Encouraged to monitor for any worsening symptoms; watch for increased shortness of breath, weakness, and signs of dehydration. Advised when to seek emergency care.  Instructed to rest and hydrate well.  Advised to leave the house during recommended isolation period, only if it is necessary to seek medical care ? ?  ?  ? Relevant Medications  ? molnupiravir EUA (LAGEVRIO) 200 mg CAPS capsule  ? ? ?I have discontinued Emely L. Sandusky's predniSONE. I am also having her start on molnupiravir EUA. Additionally, I am having her maintain her vitamin C, CRANBERRY PO, TURMERIC PO, Vitamin D-3, Multiple Vitamins-Minerals (MULTIVITAMIN ADULT PO), Humira Pen, levothyroxine, meloxicam, folic acid, and methotrexate. ? ?Meds ordered this encounter  ?Medications  ? molnupiravir EUA (LAGEVRIO) 200 mg CAPS capsule  ?  Sig: Take 4 capsules (800 mg total) by mouth 2 (two) times daily for 5 days.  ?  Dispense:  40 capsule  ?  Refill:  0  ?  Order Specific Question:   Supervising Provider  ?  Answer:   BEDSOLE, AMY E [[id number]]  ? ? ?I discussed the assessment and treatment plan with the Amy Hart. The Amy Hart was provided an opportunity to ask questions and all were answered. The Amy Hart agreed with the plan and demonstrated an understanding of the instructions. ?  ?The Amy Hart was advised to call back or seek an in-person evaluation if the symptoms worsen or if the condition fails to improve as anticipated. ? ?I provided 16 minutes of non-face-to-face time during this encounter. ? ? ?Eugenia Pancoast, FNP ?Therapist, music at [facility] ?[phone] (phone) ?[phone] (fax) ? ?[facility]  ?

## 2021-07-03 NOTE — Patient Instructions (Signed)
Your COVID19 PCR test is POSITIVE. ? ?Let us know right away if any worsening shortness of breath or persistent productive cough or fever.  ? ?Follow these current CDC guidelines for self-isolation: ? ?- Stay home for 5 days, starting the day after your symptoms (The first day is really day 0). ?- If you have no symptoms or your symptoms are resolving after 5 days, you can leave your house. ?- Continue to wear a mask around others for 5 additional days. ?**If you have a fever, continue to stay home until your fever resolves for 24 hours without fever-reducing medications.** ?] ?

## 2021-07-03 NOTE — Assessment & Plan Note (Addendum)
Pt considered high risk due to obesity, over 60 and rheumatoid arthritis. Sent antiviral to pharmacy. ? ?Advised of CDC guidelines for self isolation/ ending isolation.  Advised of safe practice guidelines. Symptom Tier reviewed.  Encouraged to monitor for any worsening symptoms; watch for increased shortness of breath, weakness, and signs of dehydration. Advised when to seek emergency care.  Instructed to rest and hydrate well.  Advised to leave the house during recommended isolation period, only if it is necessary to seek medical care ? ?

## 2021-07-06 ENCOUNTER — Other Ambulatory Visit: Payer: Self-pay | Admitting: Podiatry

## 2021-07-08 ENCOUNTER — Encounter: Payer: Self-pay | Admitting: Podiatry

## 2021-07-25 ENCOUNTER — Other Ambulatory Visit: Payer: Self-pay | Admitting: Physician Assistant

## 2021-07-25 NOTE — Telephone Encounter (Signed)
Next Visit: 09/19/2021 ? ?Last Visit: 04/18/2021 ? ?Last Fill: 02/06/2021 ? ?QZ:ESPQZRAQTM arthritis involving multiple sites with positive rheumatoid factor  ? ?Current Dose per office note 1/12/20223: Humira 40 mg sq injection every 14 days ? ?Labs: 06/21/2021 CBC and CMP WNL.  ? ?TB Gold: 01/07/2021 Neg  ? ?Okay to refill Humira?  ?

## 2021-08-06 ENCOUNTER — Ambulatory Visit (INDEPENDENT_AMBULATORY_CARE_PROVIDER_SITE_OTHER): Payer: BC Managed Care – PPO | Admitting: Podiatry

## 2021-08-06 DIAGNOSIS — M67471 Ganglion, right ankle and foot: Secondary | ICD-10-CM

## 2021-08-06 NOTE — Progress Notes (Signed)
? ?  HPI: 61 y.o. female presenting today for follow-up evaluation of symptomatic nodules to the plantar arch of the right foot just proximal to the sesamoid apparatus.  She says that since last visit she has had a few different acute flareups of the lesions.  She has worn the cam boot intermittently with relief.  She presents for further treatment and evaluation ? ?Past Medical History:  ?Diagnosis Date  ? Allergic rhinitis due to pollen   ? Collagen vascular disease ([place])   ? Plantar fasciitis of right foot   ? Rheumatoid arthritis(714.0)   ? Unspecified hypothyroidism   ? ? ?Past Surgical History:  ?Procedure Laterality Date  ? BREAST BIOPSY  2003  ? right, negative  ? ENDOMETRIAL ABLATION  2005  ? FRACTURE SURGERY  01/01  ? right elbow  ? ROTATOR CUFF REPAIR  06/2008  ? R. Cuff tear  ? TAYLOR BUNIONECTOMY    ? right  ? TREATMENT FISTULA ANAL  1993  ? repair  ? ? ?Allergies  ?Allergen Reactions  ? Naproxen   ?  REACTION: unspecified  ? ?  ?Physical Exam: ?General: The patient is alert and oriented x3 in no acute distress. ? ?Dermatology: Skin is warm, dry and supple bilateral lower extremities. Negative for open lesions or macerations. ? ?Vascular: Palpable pedal pulses bilaterally. Capillary refill within normal limits.  Negative for any significant edema or erythema ? ?Neurological: Light touch and protective threshold grossly intact ? ?Musculoskeletal Exam: No pedal deformities noted.  There are 3 distinct small palpable nodular lesions which appear to be adhered to the underlying fascia just deep to the skin about 1 cm in diameter.  There is some associated tenderness to palpation ? ?Assessment: ?1.  Probable ganglion cyst right plantar foot ? ? ?Plan of Care:  ?1. Patient evaluated. X-Rays reviewed.  ?2.  Again today we discussed surgery versus nonsurgical management and care.  Currently the lesions are somewhat asymptomatic.  She only has acute flareups intermittently and they are tolerable.  She says that  for now she is going to hold off on surgery. ?3.  Patient states that her wife, [name], who is here with her today recently underwent kidney transplant and still recovering.  She would like to hold off on surgery until after [name] has fully recovered ?4.  Return to clinic as needed ?  ?  ?Edrick Kins, DPM ?[facility] ? ?Dr. Edrick Kins, DPM  ?  ?[address]                                        ?[place], [place] [address]                ?Office [phone]  ?Fax [phone] ? ? ? ? ?

## 2021-08-29 ENCOUNTER — Other Ambulatory Visit: Payer: Self-pay | Admitting: Podiatry

## 2021-09-05 NOTE — Progress Notes (Signed)
Office Visit Note  Patient: Amy Hart             Date of Birth: 09/16/1960           MRN: [id number]             PCP: Jinny Sanders, MD Referring: Jinny Sanders, MD Visit Date: 09/19/2021 Occupation: '@GUAROCC'$ @  Subjective:  Pain in multiple joints  History of Present Illness: Amy Hart is a 61 y.o. female with history of seropositive rheumatoid arthritis and osteoarthritis.  She remains on Humira 40 mg sq injection every 14 days, methotrexate 2.5 mg 4 tablets every 7 days, and folic acid 1 mg 1 tablet daily.  She has not missed any doses recently.  She states that at the end of March 2023 she was diagnosed with COVID-19.  She states she was asymptomatic but was prescribed a prescription for Paxlovid which she completed.  She held Humira and methotrexate for about 2 weeks during that time.  She has not had any other recent infections.  She denies any new medical conditions.  She states that she has been under the care of Dr. Amalia Hailey and was diagnosed with 3 ganglion cyst on the plantar aspect of her right foot.  She does not plan to proceed with surgery at this time.  She takes meloxicam sparingly when she experiences increased pain in the right foot.  For the past 1 month she has been experiencing increased pain in both shoulders, both elbows, and both wrist joints.  She describes the pain as an aching sensation which seems to be exacerbated by weather changes.  She denies any joint swelling at this time.  Her symptoms are alleviated by using Voltaren gel topically.     Activities of Daily Living:  Patient reports morning stiffness for 20 minutes.   Patient Denies nocturnal pain.  Difficulty dressing/grooming: Denies Difficulty climbing stairs: Denies Difficulty getting out of chair: Denies Difficulty using hands for taps, buttons, cutlery, and/or writing: Denies  Review of Systems  Constitutional:  Positive for fatigue.  HENT:  Negative for mouth dryness.   Eyes:  Negative  for dryness.  Respiratory:  Negative for shortness of breath.   Cardiovascular:  Negative for swelling in legs/feet.  Gastrointestinal:  Negative for constipation.  Endocrine: Negative for excessive thirst.  Genitourinary:  Negative for difficulty urinating.  Musculoskeletal:  Positive for joint pain, joint pain, joint swelling, muscle weakness, morning stiffness and muscle tenderness.  Skin:  Negative for rash.  Allergic/Immunologic: Negative for susceptible to infections.  Neurological:  Negative for numbness.  Hematological:  Positive for bruising/bleeding tendency.  Psychiatric/Behavioral:  Positive for sleep disturbance.     PMFS History:  Patient Active Problem List   Diagnosis Date Noted   COVID-19 07/03/2021   Plantar fasciitis 03/14/2021   Class 1 obesity without serious comorbidity with body mass index (BMI) of 33.0 to 33.9 in adult 03/09/2020   Prediabetes 01/08/2017   Epistaxis, recurrent 12/05/2014   Menopausal syndrome 12/05/2014   Rheumatoid arthritis ([place]) 01/02/2012   Pure hypercholesterolemia 08/29/2009   Osteoarthritis, hands 08/24/2009   ROTATOR CUFF SYNDROME 08/04/2007   Hypothyroidism 05/19/2007   ALLERGIC RHINITIS, SEASONAL 05/19/2007    Past Medical History:  Diagnosis Date   Allergic rhinitis due to pollen    Collagen vascular disease ([place])    COVID-19 06/2021   Plantar fasciitis of right foot    Rheumatoid arthritis(714.0)    Unspecified hypothyroidism     Family History  Problem Relation Age of Onset   Hypertension Mother    Thyroid disease Mother    Nephrolithiasis Mother    Diabetes Father    Kidney Stones Sister    Past Surgical History:  Procedure Laterality Date   BREAST BIOPSY  2003   right, negative   ENDOMETRIAL ABLATION  2005   FRACTURE SURGERY  01/01   right elbow   ROTATOR CUFF REPAIR  06/2008   R. Cuff tear   Galina Haddox BUNIONECTOMY     right   TREATMENT FISTULA ANAL  1993   repair   Social History   Social History  Narrative   No exercise   Diet: Moderate   She has a Sales promotion account executive   Lives with her partner   Immunization History  Administered Date(s) Administered   Influenza Inj Mdck Quad Pf 01/09/2018   Influenza,inj,Quad PF,6+ Mos 12/07/2015, 01/09/2018, 01/19/2019, 01/24/2020, 01/16/2021   Influenza-Unspecified 01/10/2015, 01/08/2017   PFIZER(Purple Top)SARS-COV-2 Vaccination 06/17/2019, 07/12/2019, 01/27/2020, 07/27/2020   Pfizer Covid-19 Vaccine Bivalent Booster 68yr & up 01/25/2021   Pneumococcal Conjugate-13 12/07/2015   Pneumococcal Polysaccharide-23 01/08/2017   Td 02/05/1998, 07/13/2008   Tdap 03/08/2019   Zoster Recombinat (Shingrix) 03/08/2019, 05/10/2019   Zoster, Live 09/09/2013     Objective: Vital Signs: BP 125/80 (BP Location: Left Arm, Patient Position: Sitting, Cuff Size: Small)   Pulse 66   Resp 12   Ht '5\' 4"'$  (1.626 m)   Wt 191 lb (86.6 kg)   BMI 32.79 kg/m    Physical Exam Vitals and nursing note reviewed.  Constitutional:      Appearance: She is well-developed.  HENT:     Head: Normocephalic and atraumatic.  Eyes:     Conjunctiva/sclera: Conjunctivae normal.  Cardiovascular:     Rate and Rhythm: Normal rate and regular rhythm.     Heart sounds: Normal heart sounds.  Pulmonary:     Effort: Pulmonary effort is normal.     Breath sounds: Normal breath sounds.  Abdominal:     General: Bowel sounds are normal.     Palpations: Abdomen is soft.  Musculoskeletal:     Cervical back: Normal range of motion.  Skin:    General: Skin is warm and dry.     Capillary Refill: Capillary refill takes less than 2 seconds.  Neurological:     Mental Status: She is alert and oriented to person, place, and time.  Psychiatric:        Behavior: Behavior normal.      Musculoskeletal Exam: C-spine, thoracic spine, and lumbar spine have good ROM.  Shoulder joints have good ROM with some discomfort.  Right elbow joint contracture.  Tenderness over medial and lateral  epicondyles bilaterally.  Tenderness over both CMC joints.  No synovitis or tenosynovitis of wrists.  No tenderness or synovitis of MCP joints.  Thickening of PIP and DIP joints.  Hip joints have good ROM with no groin pain.  Knee joints have good ROM with no warmth or effusion.  Ankle joints have good ROM with no tenderness or joint swelling.   CDAI Exam: CDAI Score: 1  Patient Global: 5 mm; Provider Global: 5 mm Swollen: 0 ; Tender: 0  Joint Exam 09/19/2021   No joint exam has been documented for this visit   There is currently no information documented on the homunculus. Go to the Rheumatology activity and complete the homunculus joint exam.  Investigation: No additional findings.  Imaging: No results found.  Recent Labs: Lab Results  Component Value Date   WBC 5.1 06/21/2021   HGB 14.4 06/21/2021   PLT 173 06/21/2021   NA 140 06/21/2021   K 5.2 06/21/2021   CL 104 06/21/2021   CO2 29 06/21/2021   GLUCOSE 95 06/21/2021   BUN 18 06/21/2021   CREATININE 0.87 06/21/2021   BILITOT 0.6 06/21/2021   ALKPHOS 66 03/07/2021   AST 31 06/21/2021   ALT 28 06/21/2021   PROT 6.8 06/21/2021   ALBUMIN 4.3 03/07/2021   CALCIUM 9.7 06/21/2021   GFRAA 89 09/12/2020   QFTBGOLDPLUS NEGATIVE 01/07/2021    Speciality Comments: No specialty comments available.  Procedures:  No procedures performed Allergies: Naproxen   Assessment / Plan:     Visit Diagnoses: Rheumatoid arthritis involving multiple sites with positive rheumatoid factor ([place]) - She has no synovitis on examination today.  She presents today with increased joint pain involving both shoulders, both elbows, both wrist joints which started about 1 month ago.  She has been under increased stress recently and has been performing more household activities and yard work recently.  She has tenderness over both CMC joints but no synovitis or tenosynovitis of her wrist joints were noted.  Right elbow joint contracture is unchanged from  a previous injury.  Her symptoms seem to be due to underlying osteoarthritis.  A sed rate will be checked today for further evaluation.  Overall her rheumatoid arthritis seems well controlled on Humira 40 mg sq injections every 14 days and methotrexate 4 tablets by mouth once weekly.  Discussed the importance of avoiding concurrent use of meloxicam and methotrexate due to drug to drug interaction.  She will remain on Humira and methotrexate as combination therapy.  She was advised to notify us if her symptoms persist or worsen.  She will follow up in 5 months or sooner if needed.  Plan: Sedimentation rate  High risk medication use - Humira 40 mg sq injection every 14 days, methotrexate 2.5 mg 4 tablets every 7 days, and folic acid 1 mg 1 tablet daily.  - Plan: CBC with Differential/Platelet, COMPLETE METABOLIC PANEL WITH GFR CBC and CMP updated on 06/21/21.  Orders for CBC and CMP were released today.  Her next lab work will be due in September and every 3 months to monitor for drug toxicity.  Standing orders for CBC and CMP remain in place. TB gold negative 01/07/21.   She was advised to hold humira and methotrexate if she develops signs or symptoms of an infection and to resume once the infection has completely cleared.   Primary osteoarthritis of both hands: She has PIP and DIP thickening consistent with osteoarthritis of both hands. Tenderness over both CMC joints.  Discussed the use of CMC joint braces and voltaren gel.   Contracture of elbow joint, right: Unchanged.  Previous injury.  No synovitis noted.   Plantar fasciitis of right foot: Under the care of Dr. Amalia Hailey.  Probable ganglion cyst of right plantar foot diagnosed by Dr. Amalia Hailey.  Office visit note from 08/06/2021 was reviewed today in the office.  She does not want to proceed with surgical intervention at this time.  Her symptoms have been intermittent for which she takes meloxicam sparingly for pain relief.  She has been wearing proper fitting  shoes.  Other medical conditions are listed as follows:   Prediabetes  History of hypothyroidism  History of hematuria  History of kidney stones  Orders: Orders Placed This Encounter  Procedures   CBC with Differential/Platelet  COMPLETE METABOLIC PANEL WITH GFR   Sedimentation rate   No orders of the defined types were placed in this encounter.     Follow-Up Instructions: Return in about 5 months (around 02/19/2022) for Rheumatoid arthritis, Osteoarthritis.   Ofilia Neas, PA-C  Note - This record has been created using Dragon software.  Chart creation errors have been sought, but may not always  have been located. Such creation errors do not reflect on  the standard of medical care.

## 2021-09-10 ENCOUNTER — Other Ambulatory Visit: Payer: Self-pay | Admitting: Physician Assistant

## 2021-09-10 NOTE — Telephone Encounter (Signed)
Next Visit: 09/19/2021  Last Visit: 04/18/2021  Last Fill: 07/02/2021  DX: Rheumatoid arthritis involving multiple sites with positive rheumatoid factor   Current Dose per office note 04/18/2021: methotrexate 2.5 mg 4 tablets every 7 days  Labs: 06/21/2021 CBC and CMP WNL.   Patient to update labs at upcoming appointment on 09/19/2021.   Okay to refill MTX?

## 2021-09-17 ENCOUNTER — Encounter: Payer: Self-pay | Admitting: Podiatry

## 2021-09-17 ENCOUNTER — Other Ambulatory Visit: Payer: Self-pay | Admitting: Podiatry

## 2021-09-17 MED ORDER — MELOXICAM 15 MG PO TABS: 15.0000 mg | ORAL_TABLET | Freq: Every day | ORAL | 1 refills | Status: DC

## 2021-09-17 NOTE — Telephone Encounter (Signed)
Please advise 

## 2021-09-19 ENCOUNTER — Ambulatory Visit (INDEPENDENT_AMBULATORY_CARE_PROVIDER_SITE_OTHER): Payer: BC Managed Care – PPO | Admitting: Physician Assistant

## 2021-09-19 ENCOUNTER — Encounter: Payer: Self-pay | Admitting: Physician Assistant

## 2021-09-19 VITALS — BP 125/80 | HR 66 | Resp 12 | Ht 64.0 in | Wt 191.0 lb

## 2021-09-19 DIAGNOSIS — M19042 Primary osteoarthritis, left hand: Secondary | ICD-10-CM

## 2021-09-19 DIAGNOSIS — R7303 Prediabetes: Secondary | ICD-10-CM

## 2021-09-19 DIAGNOSIS — Z79899 Other long term (current) drug therapy: Secondary | ICD-10-CM

## 2021-09-19 DIAGNOSIS — Z8639 Personal history of other endocrine, nutritional and metabolic disease: Secondary | ICD-10-CM

## 2021-09-19 DIAGNOSIS — M0579 Rheumatoid arthritis with rheumatoid factor of multiple sites without organ or systems involvement: Secondary | ICD-10-CM

## 2021-09-19 DIAGNOSIS — M19041 Primary osteoarthritis, right hand: Secondary | ICD-10-CM

## 2021-09-19 DIAGNOSIS — M24521 Contracture, right elbow: Secondary | ICD-10-CM | POA: Diagnosis not present

## 2021-09-19 DIAGNOSIS — Z87448 Personal history of other diseases of urinary system: Secondary | ICD-10-CM

## 2021-09-19 DIAGNOSIS — Z87442 Personal history of urinary calculi: Secondary | ICD-10-CM

## 2021-09-19 DIAGNOSIS — M722 Plantar fascial fibromatosis: Secondary | ICD-10-CM

## 2021-09-19 NOTE — Patient Instructions (Signed)

## 2021-09-20 LAB — COMPLETE METABOLIC PANEL WITH GFR
AG Ratio: 1.5 (calc) (ref 1.0–2.5)
ALT: 15 U/L (ref 6–29)
AST: 19 U/L (ref 10–35)
Albumin: 4.2 g/dL (ref 3.6–5.1)
Alkaline phosphatase (APISO): 64 U/L (ref 37–153)
BUN: 15 mg/dL (ref 7–25)
CO2: 30 mmol/L (ref 20–32)
Calcium: 9.3 mg/dL (ref 8.6–10.4)
Chloride: 103 mmol/L (ref 98–110)
Creat: 0.83 mg/dL (ref 0.50–1.05)
Globulin: 2.8 g/dL (calc) (ref 1.9–3.7)
Glucose, Bld: 92 mg/dL (ref 65–99)
Potassium: 4.3 mmol/L (ref 3.5–5.3)
Sodium: 140 mmol/L (ref 135–146)
Total Bilirubin: 0.5 mg/dL (ref 0.2–1.2)
Total Protein: 7 g/dL (ref 6.1–8.1)
eGFR: 81 mL/min/{1.73_m2} (ref >=60)

## 2021-09-20 LAB — CBC WITH DIFFERENTIAL/PLATELET
Absolute Monocytes: 433 cells/uL (ref 200–950)
Basophils Absolute: 29 cells/uL (ref 0–200)
Basophils Relative: 0.7 %
Eosinophils Absolute: 151 cells/uL (ref 15–500)
Eosinophils Relative: 3.6 %
HCT: 43.5 % (ref 35.0–45.0)
Hemoglobin: 14.5 g/dL (ref 11.7–15.5)
Lymphs Abs: 1844 cells/uL (ref 850–3900)
MCH: 33 pg (ref 27.0–33.0)
MCHC: 33.3 g/dL (ref 32.0–36.0)
MCV: 98.9 fL (ref 80.0–100.0)
MPV: 12.7 fL — ABNORMAL HIGH (ref 7.5–12.5)
Monocytes Relative: 10.3 %
Neutro Abs: 1743 cells/uL (ref 1500–7800)
Neutrophils Relative %: 41.5 %
Platelets: 164 10*3/uL (ref 140–400)
RBC: 4.4 10*6/uL (ref 3.80–5.10)
RDW: 13 % (ref 11.0–15.0)
Total Lymphocyte: 43.9 %
WBC: 4.2 10*3/uL (ref 3.8–10.8)

## 2021-09-20 LAB — SEDIMENTATION RATE: Sed Rate: 6 mm/h (ref 0–30)

## 2021-09-20 NOTE — Progress Notes (Signed)
CMP WNL.  CBC stable. ESR WNL.

## 2021-10-22 ENCOUNTER — Other Ambulatory Visit: Payer: Self-pay | Admitting: Physician Assistant

## 2021-10-22 NOTE — Telephone Encounter (Signed)
Next Visit: 03/13/2022  Last Visit: 09/19/2021  Last Fill: 07/25/2021  DX: Rheumatoid arthritis involving multiple sites with positive rheumatoid factor   Current Dose per office note 09/19/2021: Humira 40 mg sq injection every 14 days  Labs: 09/19/2021 CMP WNL.  CBC stable. ESR WNL.   TB Gold: 01/07/2022   Okay to refill Humira?

## 2021-10-30 ENCOUNTER — Other Ambulatory Visit: Payer: Self-pay | Admitting: *Deleted

## 2021-10-30 ENCOUNTER — Telehealth: Payer: Self-pay | Admitting: Rheumatology

## 2021-10-30 NOTE — Telephone Encounter (Signed)
I called patient, RX sent for 1 year 05/2021

## 2021-10-30 NOTE — Telephone Encounter (Signed)
Patient left a voicemail requesting a refill of Folic acid '1mg'$  be sent to [facility] in [place].

## 2021-11-07 ENCOUNTER — Telehealth: Payer: BC Managed Care – PPO | Admitting: Physician Assistant

## 2021-11-07 DIAGNOSIS — U071 COVID-19: Secondary | ICD-10-CM | POA: Diagnosis not present

## 2021-11-07 MED ORDER — NIRMATRELVIR/RITONAVIR (PAXLOVID)TABLET: 3.0000 | ORAL_TABLET | Freq: Two times a day (BID) | ORAL | 0 refills | Status: AC

## 2021-11-07 MED ORDER — BENZONATATE 100 MG PO CAPS: 100.0000 mg | ORAL_CAPSULE | Freq: Three times a day (TID) | ORAL | 0 refills | Status: DC | PRN

## 2021-11-07 NOTE — Patient Instructions (Signed)
Amy Hart, thank you for joining Leeanne Rio, PA-C for today's virtual visit.  While this provider is not your primary care provider (PCP), if your PCP is located in our provider database this encounter information will be shared with them immediately following your visit.  Consent: (Patient) Amy Hart provided verbal consent for this virtual visit at the beginning of the encounter.  Current Medications:  Current Outpatient Medications:    benzonatate (TESSALON) 100 MG capsule, Take 1 capsule (100 mg total) by mouth 3 (three) times daily as needed for cough., Disp: 30 capsule, Rfl: 0   nirmatrelvir/ritonavir EUA (PAXLOVID) 20 x 150 MG & 10 x '100MG'$  TABS, Take 3 tablets by mouth 2 (two) times daily for 5 days. (Take nirmatrelvir 150 mg two tablets twice daily for 5 days and ritonavir 100 mg one tablet twice daily for 5 days) Patient GFR is 81, Disp: 30 tablet, Rfl: 0   Cholecalciferol (VITAMIN D-3) 1000 units CAPS, Take 1 capsule by mouth daily., Disp: , Rfl:    CRANBERRY PO, Take 4,200 mg by mouth daily., Disp: , Rfl:    folic acid (FOLVITE) 1 MG tablet, Take 2 tablets (2 mg total) by mouth daily., Disp: 180 tablet, Rfl: 3   HUMIRA PEN 40 MG/0.4ML PNKT, INJECT 1 PEN UNDER THE SKIN EVERY 14 DAYS., Disp: 2 each, Rfl: 2   levothyroxine (SYNTHROID) 100 MCG tablet, TAKE 1 TABLET BY MOUTH EVERY DAY, Disp: 90 tablet, Rfl: 3   methotrexate (RHEUMATREX) 2.5 MG tablet, TAKE 4 TABLETS (10 MG TOTAL) BY MOUTH ONCE A WEEK, Disp: 48 tablet, Rfl: 0   Multiple Vitamins-Minerals (MULTIVITAMIN ADULT PO), Take by mouth daily., Disp: , Rfl:    TURMERIC PO, Take 1,000 mg by mouth daily., Disp: , Rfl:    vitamin C (ASCORBIC ACID) 500 MG tablet, Take 500 mg by mouth daily., Disp: , Rfl:    Medications ordered in this encounter:  Meds ordered this encounter  Medications   benzonatate (TESSALON) 100 MG capsule    Sig: Take 1 capsule (100 mg total) by mouth 3 (three) times daily as needed for cough.     Dispense:  30 capsule    Refill:  0    Order Specific Question:   Supervising Provider    Answer:   Noemi Chapel [[id number]]   nirmatrelvir/ritonavir EUA (PAXLOVID) 20 x 150 MG & 10 x '100MG'$  TABS    Sig: Take 3 tablets by mouth 2 (two) times daily for 5 days. (Take nirmatrelvir 150 mg two tablets twice daily for 5 days and ritonavir 100 mg one tablet twice daily for 5 days) Patient GFR is 81    Dispense:  30 tablet    Refill:  0    Order Specific Question:   Supervising Provider    Answer:   Noemi Chapel [[id number]]     *If you need refills on other medications prior to your next appointment, please contact your pharmacy*  Follow-Up: Call back or seek an in-person evaluation if the symptoms worsen or if the condition fails to improve as anticipated.  Other Instructions Please keep well-hydrated and get plenty of rest. Start a saline nasal rinse to flush out your nasal passages. You can use plain Mucinex to help thin congestion. If you have a humidifier, running in the bedroom at night. I want you to start OTC vitamin D3 1000 units daily, vitamin C 1000 mg daily, and a zinc supplement. Please take prescribed medications as directed.  You have been  enrolled in a MyChart symptom monitoring program. Please answer these questions daily so we can keep track of how you are doing.  You were to quarantine for 5 days from onset of your symptoms.  After day 5, if you have had no fever and you are feeling better, you can end quarantine but need to mask for an additional 5 days. After day 5 if you have a fever or are having significant symptoms, please quarantine for full 10 days.  If you note any worsening of symptoms, any significant shortness of breath or any chest pain, please seek ER evaluation ASAP.  Please do not delay care!  COVID-19: What to Do if You Are Sick If you test positive and are an older adult or someone who is at high risk of getting very sick from COVID-19, treatment may be  available. Contact a healthcare provider right away after a positive test to determine if you are eligible, even if your symptoms are mild right now. You can also visit a Test to Treat location and, if eligible, receive a prescription from a provider. Don't delay: Treatment must be started within the first few days to be effective. If you have a fever, cough, or other symptoms, you might have COVID-19. Most people have mild illness and are able to recover at home. If you are sick: Keep track of your symptoms. If you have an emergency warning sign (including trouble breathing), call 911. Steps to help prevent the spread of COVID-19 if you are sick If you are sick with COVID-19 or think you might have COVID-19, follow the steps below to care for yourself and to help protect other people in your home and community. Stay home except to get medical care Stay home. Most people with COVID-19 have mild illness and can recover at home without medical care. Do not leave your home, except to get medical care. Do not visit public areas and do not go to places where you are unable to wear a mask. Take care of yourself. Get rest and stay hydrated. Take over-the-counter medicines, such as acetaminophen, to help you feel better. Stay in touch with your doctor. Call before you get medical care. Be sure to get care if you have trouble breathing, or have any other emergency warning signs, or if you think it is an emergency. Avoid public transportation, ride-sharing, or taxis if possible. Get tested If you have symptoms of COVID-19, get tested. While waiting for test results, stay away from others, including staying apart from those living in your household. Get tested as soon as possible after your symptoms start. Treatments may be available for people with COVID-19 who are at risk for becoming very sick. Don't delay: Treatment must be started early to be effective--some treatments must begin within 5 days of your first  symptoms. Contact your healthcare provider right away if your test result is positive to determine if you are eligible. Self-tests are one of several options for testing for the virus that causes COVID-19 and may be more convenient than laboratory-based tests and point-of-care tests. Ask your healthcare provider or your local health department if you need help interpreting your test results. You can visit your state, tribal, local, and territorial health department's website to look for the latest local information on testing sites. Separate yourself from other people As much as possible, stay in a specific room and away from other people and pets in your home. If possible, you should use a separate bathroom. If you  need to be around other people or animals in or outside of the home, wear a well-fitting mask. Tell your close contacts that they may have been exposed to COVID-19. An infected person can spread COVID-19 starting 48 hours (or 2 days) before the person has any symptoms or tests positive. By letting your close contacts know they may have been exposed to COVID-19, you are helping to protect everyone. See COVID-19 and Animals if you have questions about pets. If you are diagnosed with COVID-19, someone from the health department may call you. Answer the call to slow the spread. Monitor your symptoms Symptoms of COVID-19 include fever, cough, or other symptoms. Follow care instructions from your healthcare provider and local health department. Your local health authorities may give instructions on checking your symptoms and reporting information. When to seek emergency medical attention Look for emergency warning signs* for COVID-19. If someone is showing any of these signs, seek emergency medical care immediately: Trouble breathing Persistent pain or pressure in the chest New confusion Inability to wake or stay awake Pale, gray, or blue-colored skin, lips, or nail beds, depending on skin  tone *This list is not all possible symptoms. Please call your medical provider for any other symptoms that are severe or concerning to you. Call 911 or call ahead to your local emergency facility: Notify the operator that you are seeking care for someone who has or may have COVID-19. Call ahead before visiting your doctor Call ahead. Many medical visits for routine care are being postponed or done by phone or telemedicine. If you have a medical appointment that cannot be postponed, call your doctor's office, and tell them you have or may have COVID-19. This will help the office protect themselves and other patients. If you are sick, wear a well-fitting mask You should wear a mask if you must be around other people or animals, including pets (even at home). Wear a mask with the best fit, protection, and comfort for you. You don't need to wear the mask if you are alone. If you can't put on a mask (because of trouble breathing, for example), cover your coughs and sneezes in some other way. Try to stay at least 6 feet away from other people. This will help protect the people around you. Masks should not be placed on young children under age 3 years, anyone who has trouble breathing, or anyone who is not able to remove the mask without help. Cover your coughs and sneezes Cover your mouth and nose with a tissue when you cough or sneeze. Throw away used tissues in a lined trash can. Immediately wash your hands with soap and water for at least 20 seconds. If soap and water are not available, clean your hands with an alcohol-based hand sanitizer that contains at least 60% alcohol. Clean your hands often Wash your hands often with soap and water for at least 20 seconds. This is especially important after blowing your nose, coughing, or sneezing; going to the bathroom; and before eating or preparing food. Use hand sanitizer if soap and water are not available. Use an alcohol-based hand sanitizer with at least  60% alcohol, covering all surfaces of your hands and rubbing them together until they feel dry. Soap and water are the best option, especially if hands are visibly dirty. Avoid touching your eyes, nose, and mouth with unwashed hands. Handwashing Tips Avoid sharing personal household items Do not share dishes, drinking glasses, cups, eating utensils, towels, or bedding with other people in  your home. Wash these items thoroughly after using them with soap and water or put in the dishwasher. Clean surfaces in your home regularly Clean and disinfect high-touch surfaces (for example, doorknobs, tables, handles, light switches, and countertops) in your "sick room" and bathroom. In shared spaces, you should clean and disinfect surfaces and items after each use by the person who is ill. If you are sick and cannot clean, a caregiver or other person should only clean and disinfect the area around you (such as your bedroom and bathroom) on an as needed basis. Your caregiver/other person should wait as long as possible (at least several hours) and wear a mask before entering, cleaning, and disinfecting shared spaces that you use. Clean and disinfect areas that may have blood, stool, or body fluids on them. Use household cleaners and disinfectants. Clean visible dirty surfaces with household cleaners containing soap or detergent. Then, use a household disinfectant. Use a product from [employer] List N: Disinfectants for Coronavirus (PZWCH-85). Be sure to follow the instructions on the label to ensure safe and effective use of the product. Many products recommend keeping the surface wet with a disinfectant for a certain period of time (look at "contact time" on the product label). You may also need to wear personal protective equipment, such as gloves, depending on the directions on the product label. Immediately after disinfecting, wash your hands with soap and water for 20 seconds. For completed guidance on cleaning  and disinfecting your home, visit Complete Disinfection Guidance. Take steps to improve ventilation at home Improve ventilation (air flow) at home to help prevent from spreading COVID-19 to other people in your household. Clear out COVID-19 virus particles in the air by opening windows, using air filters, and turning on fans in your home. Use this interactive tool to learn how to improve air flow in your home. When you can be around others after being sick with COVID-19 Deciding when you can be around others is different for different situations. Find out when you can safely end home isolation. For any additional questions about your care, contact your healthcare provider or state or local health department. 06/26/2020 Content source: [facility] for Immunization and Respiratory Diseases ([facility]), Division of Viral Diseases This information is not intended to replace advice given to you by your health care provider. Make sure you discuss any questions you have with your health care provider. Document Revised: 08/09/2020 Document Reviewed: 08/09/2020 Elsevier Patient Education  2022 [employer].      If you have been instructed to have an in-person evaluation today at a local Urgent Care facility, please use the link below. It will take you to a list of all of our available [facility], including address, phone number and hours of operation. Please do not delay care.  [facility]  If you or a family member do not have a primary care provider, use the link below to schedule a visit and establish care. When you choose a [facility] primary care physician or advanced practice provider, you gain a long-term partner in health. Find a Primary Care Provider  Learn more about [facility]'s in-office and virtual care options: [facility]

## 2021-11-07 NOTE — Progress Notes (Signed)
Virtual Visit Consent   [name], you are scheduled for a virtual visit with a [facility] provider today. Just as with appointments in the office, your consent must be obtained to participate. Your consent will be active for this visit and any virtual visit you may have with one of our providers in the next 365 days. If you have a MyChart account, a copy of this consent can be sent to you electronically.  As this is a virtual visit, video technology does not allow for your provider to perform a traditional examination. This may limit your provider's ability to fully assess your condition. If your provider identifies any concerns that need to be evaluated in person or the need to arrange testing (such as labs, EKG, etc.), we will make arrangements to do so. Although advances in technology are sophisticated, we cannot ensure that it will always work on either your end or our end. If the connection with a video visit is poor, the visit may have to be switched to a telephone visit. With either a video or telephone visit, we are not always able to ensure that we have a secure connection.  By engaging in this virtual visit, you consent to the provision of healthcare and authorize for your insurance to be billed (if applicable) for the services provided during this visit. Depending on your insurance coverage, you may receive a charge related to this service.  I need to obtain your verbal consent now. Are you willing to proceed with your visit today? Amy Hart has provided verbal consent on 11/07/2021 for a virtual visit (video or telephone). Leeanne Rio, [place]  Date: 11/07/2021 6:01 PM  Virtual Visit via Video Note   I, Leeanne Rio, connected with  Amy Hart  ([id number], 10-09-1960) on 11/07/21 at  6:00 PM EDT by a video-enabled telemedicine application and verified that I am speaking with the correct person using two identifiers.  Location: Patient: Virtual Visit Location  Patient: Home Provider: Virtual Visit Location Provider: Home Office   I discussed the limitations of evaluation and management by telemedicine and the availability of in person appointments. The patient expressed understanding and agreed to proceed.    History of Present Illness: Amy Hart is a 61 y.o. who identifies as a female who was assigned female at birth, and is being seen today for COVID-19. Notes symptoms starting two days ago with mild nasal congestion and sneezing, now with some increased congestion and cough. Notes fever with Tmax 101, some body aches and fatigue. Denies chest pain or SOB. Recently traveled so took a home COVID test which came back positive. Has had COVID once before but was asymptomatic. She is immunocompromised due to immunosuppressant medications for autoimmune illness.  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/24/2009   ROTATOR CUFF SYNDROME 08/04/2007   Hypothyroidism 05/19/2007   ALLERGIC RHINITIS, SEASONAL 05/19/2007    Allergies:  Allergies  Allergen Reactions   Naproxen     REACTION: unspecified   Medications:  Current Outpatient Medications:    Cholecalciferol (VITAMIN D-3) 1000 units CAPS, Take 1 capsule  by mouth daily., Disp: , Rfl:    CRANBERRY PO, Take 4,200 mg by mouth daily., Disp: , Rfl:    folic acid (FOLVITE) 1 MG tablet, Take 2 tablets (2 mg total) by mouth daily., Disp: 180 tablet, Rfl: 3   HUMIRA PEN 40 MG/0.4ML PNKT, INJECT 1 PEN UNDER THE SKIN EVERY 14 DAYS., Disp: 2 each, Rfl: 2   levothyroxine (SYNTHROID) 100 MCG tablet, TAKE 1 TABLET  BY MOUTH EVERY DAY, Disp: 90 tablet, Rfl: 3   methotrexate (RHEUMATREX) 2.5 MG tablet, TAKE 4 TABLETS (10 MG TOTAL) BY MOUTH ONCE A WEEK, Disp: 48 tablet, Rfl: 0   Multiple Vitamins-Minerals (MULTIVITAMIN ADULT PO), Take by mouth daily., Disp: , Rfl:    TURMERIC PO, Take 1,000 mg by mouth daily., Disp: , Rfl:    vitamin C (ASCORBIC ACID) 500 MG tablet, Take 500 mg by mouth daily., Disp: , Rfl:   Observations/Objective: Patient is well-developed, well-nourished in no acute distress.  Resting comfortably at home.  Head is normocephalic, atraumatic.  No labored breathing. Speech is clear and coherent with logical content.  Patient is alert and oriented at baseline.   Assessment and Plan: 1. COVID-19  Patient with multiple risk factors for complicated course of illness. Discussed risks/benefits of antiviral medications including most common potential ADRs. Patient voiced understanding and would like to proceed with antiviral medication. They are candidate for Paxlovid with GFR > 80. Rx sent to pharmacy. Supportive measures, OTC medications and vitamin regimen reviewed. She is to discuss next dosing schedule for Humira and Methotrexate with her Rheumatologist. Patient has been enrolled in a MyChart COVID symptom monitoring program. Samule Dry reviewed in detail. Strict ER precautions discussed with patient.    Follow Up Instructions: I discussed the assessment and treatment plan with the patient. The patient was provided an opportunity to ask questions and all were answered. The patient agreed with the plan and demonstrated an understanding of the instructions.  A copy of instructions were sent to the patient via MyChart unless otherwise noted below.   The patient was advised to call back or seek an in-person evaluation if the symptoms worsen or if the condition fails to improve as anticipated.  Time:  I spent 10 minutes with the patient via telehealth technology discussing the above  problems/concerns.    Leeanne Rio, PA-C

## 2021-11-07 NOTE — Progress Notes (Signed)
   Thank you for the details you included in the comment boxes. Those details are very helpful in determining the best course of treatment for you and help [place] to provide the best care.Because of medical history and potential desire for antivirals which cannot be prescribed via e-visit as noted in your questionnaire, we recommend that you convert this visit to a video visit in order for the provider to better assess what is going on.  The provider will be able to give you a more accurate diagnosis and treatment plan if we can more freely discuss your symptoms and with the addition of a virtual examination.   If you convert to a video visit, we will bill your insurance (similar to an office visit) and you will not be charged for this e-Visit. You will be able to stay at home and speak with the first available [facility] advanced practice provider. The link to do a video visit is in the drop down Menu tab of your Welcome screen in [place].

## 2021-11-08 ENCOUNTER — Telehealth: Payer: Self-pay

## 2021-11-08 NOTE — Telephone Encounter (Signed)
Called pt in regards to COVID-19 questionnaire filled out on my chart. Provide patient wit care instructions for worsening appetite symptoms from my chart companion chart. Patient verb understanding.

## 2021-11-11 ENCOUNTER — Encounter: Payer: Self-pay | Admitting: Rheumatology

## 2021-11-14 ENCOUNTER — Other Ambulatory Visit: Payer: Self-pay | Admitting: Podiatry

## 2021-11-20 ENCOUNTER — Other Ambulatory Visit: Payer: Self-pay | Admitting: *Deleted

## 2021-11-20 NOTE — Patient Outreach (Signed)
  Care Coordination   11/20/2021 Name: Amy Hart MRN: [id number] DOB: Feb 05, 1961   Care Coordination Outreach Attempts:  An unsuccessful telephone outreach was attempted today to offer the patient information about available care coordination services as a benefit of their health plan.   Follow Up Plan:  Additional outreach attempts will be made to offer the patient care coordination information and services.   Encounter Outcome:  No Answer  Care Coordination Interventions Activated:  Yes   Care Coordination Interventions:  No, not indicated    [facility] [phone]

## 2021-11-20 NOTE — Patient Outreach (Signed)
  Care Coordination   Initial Visit Note   11/20/2021 Name: Amy Hart MRN: [id number] DOB: May 11, 1960  Amy Hart is a 61 y.o. year old female who sees Jinny Sanders, MD for primary care. I spoke with  Amy Hart by phone today  RN discussed services [facility] services, RN, SW, and Pharmacist. Patient declined services.    Goals Addressed   None     SDOH assessments and interventions completed:  No     Care Coordination Interventions Activated:  No  Care Coordination Interventions:  No, not indicated   Follow up plan: No further intervention required.   Encounter Outcome:  Pt. Visit Completed   [facility] [phone]

## 2021-12-05 ENCOUNTER — Other Ambulatory Visit: Payer: Self-pay | Admitting: Physician Assistant

## 2021-12-05 NOTE — Telephone Encounter (Signed)
Next Visit: 03/13/2022   Last Visit: 09/19/2021   Last Fill: 05/31/2021  DX: Rheumatoid arthritis involving multiple sites with positive rheumatoid factor    Current Dose per office note 2/87/8676:  folic acid 1 mg 1 tablet daily.  Okay to refill Folic Acid?

## 2021-12-07 ENCOUNTER — Other Ambulatory Visit: Payer: Self-pay | Admitting: Physician Assistant

## 2021-12-10 NOTE — Telephone Encounter (Signed)
Next Visit: 03/13/2022   Last Visit: 09/19/2021   Last Fill: 09/10/2021  DX: Rheumatoid arthritis involving multiple sites with positive rheumatoid factor    Current Dose per office note 09/19/2021: methotrexate 2.5 mg 4 tablets every 7 days  Labs: 09/19/2021 CMP WNL.  CBC stable. ESR WNL.   Okay to refill MTX?

## 2021-12-13 ENCOUNTER — Other Ambulatory Visit: Payer: Self-pay | Admitting: *Deleted

## 2021-12-13 DIAGNOSIS — Z79899 Other long term (current) drug therapy: Secondary | ICD-10-CM | POA: Diagnosis not present

## 2021-12-13 DIAGNOSIS — Z9225 Personal history of immunosupression therapy: Secondary | ICD-10-CM | POA: Diagnosis not present

## 2021-12-13 DIAGNOSIS — Z111 Encounter for screening for respiratory tuberculosis: Secondary | ICD-10-CM

## 2021-12-15 LAB — CBC WITH DIFFERENTIAL/PLATELET
Absolute Monocytes: 396 cells/uL (ref 200–950)
Basophils Absolute: 28 cells/uL (ref 0–200)
Basophils Relative: 0.7 %
Eosinophils Absolute: 88 cells/uL (ref 15–500)
Eosinophils Relative: 2.2 %
HCT: 43.1 % (ref 35.0–45.0)
Hemoglobin: 14.6 g/dL (ref 11.7–15.5)
Lymphs Abs: 1624 cells/uL (ref 850–3900)
MCH: 33.8 pg — ABNORMAL HIGH (ref 27.0–33.0)
MCHC: 33.9 g/dL (ref 32.0–36.0)
MCV: 99.8 fL (ref 80.0–100.0)
MPV: 12 fL (ref 7.5–12.5)
Monocytes Relative: 9.9 %
Neutro Abs: 1864 cells/uL (ref 1500–7800)
Neutrophils Relative %: 46.6 %
Platelets: 163 10*3/uL (ref 140–400)
RBC: 4.32 10*6/uL (ref 3.80–5.10)
RDW: 12.7 % (ref 11.0–15.0)
Total Lymphocyte: 40.6 %
WBC: 4 10*3/uL (ref 3.8–10.8)

## 2021-12-15 LAB — COMPLETE METABOLIC PANEL WITH GFR
AG Ratio: 1.6 (calc) (ref 1.0–2.5)
ALT: 26 U/L (ref 6–29)
AST: 28 U/L (ref 10–35)
Albumin: 4.6 g/dL (ref 3.6–5.1)
Alkaline phosphatase (APISO): 67 U/L (ref 37–153)
BUN: 16 mg/dL (ref 7–25)
CO2: 27 mmol/L (ref 20–32)
Calcium: 9.4 mg/dL (ref 8.6–10.4)
Chloride: 105 mmol/L (ref 98–110)
Creat: 0.83 mg/dL (ref 0.50–1.05)
Globulin: 2.8 g/dL (calc) (ref 1.9–3.7)
Glucose, Bld: 107 mg/dL — ABNORMAL HIGH (ref 65–99)
Potassium: 5.2 mmol/L (ref 3.5–5.3)
Sodium: 140 mmol/L (ref 135–146)
Total Bilirubin: 0.7 mg/dL (ref 0.2–1.2)
Total Protein: 7.4 g/dL (ref 6.1–8.1)
eGFR: 81 mL/min/{1.73_m2} (ref >=60)

## 2021-12-15 LAB — QUANTIFERON-TB GOLD PLUS
Mitogen-NIL: >10 IU/mL
NIL: 0.08 IU/mL
QuantiFERON-TB Gold Plus: NEGATIVE
TB1-NIL: <0 IU/mL
TB2-NIL: <0 IU/mL

## 2021-12-16 NOTE — Progress Notes (Signed)
CBC and CMP are stable.  TB Gold is negative.

## 2022-01-14 ENCOUNTER — Other Ambulatory Visit: Payer: Self-pay | Admitting: Physician Assistant

## 2022-01-14 NOTE — Telephone Encounter (Signed)
Next Visit: 03/13/2022  Last Visit: 09/19/2021  Last Fill: 10/22/2021  YF:[id number] arthritis involving multiple sites with positive rheumatoid factor   Current Dose per office note 09/19/2021: Humira 40 mg sq injections every 14 days   Labs: 12/13/2021 CBC and CMP are stable.  TB Gold: 12/13/2021 Neg   Okay to refill Humira?

## 2022-02-05 ENCOUNTER — Encounter: Payer: Self-pay | Admitting: Internal Medicine

## 2022-03-03 NOTE — Progress Notes (Signed)
Office Visit Note  Patient: Amy Hart             Date of Birth: Apr 22, 1960           MRN: [id number]             PCP: Jinny Sanders, MD Referring: Jinny Sanders, MD Visit Date: 03/13/2022 Occupation: '@GUAROCC'$ @  Subjective:  Follow-up (Left hand pain)   History of Present Illness: ALAYLAH HEATHERINGTON is a 61 y.o. female history of seropositive rheumatoid arthritis and osteoarthritis.  She states she continues to have pain and discomfort in her left CMC joint.  She has been using a CMC brace which is not giving her relief.  She has discomfort holding objects and doing routine activities.  Also reports nocturnal pain in her left CMC joint.  She is interested in getting a cortisone injection to her [facility] joint.  She has tried applying Voltaren gel.  None of the other joints are painful or swollen.  Did not have any recent episodes of planta fasciitis.  She recently passed a kidney stone.  Activities of Daily Living:  Patient reports morning stiffness for 30 minutes.   Patient Denies nocturnal pain.  Difficulty dressing/grooming: Denies Difficulty climbing stairs: Denies Difficulty getting out of chair: Denies Difficulty using hands for taps, buttons, cutlery, and/or writing: Denies  Review of Systems  Constitutional:  Negative for fatigue.  HENT:  Negative for mouth sores and mouth dryness.   Eyes:  Negative for dryness.  Respiratory:  Negative for shortness of breath.   Cardiovascular:  Negative for chest pain and palpitations.  Gastrointestinal:  Negative for blood in stool, constipation and diarrhea.  Endocrine: Negative for increased urination.  Genitourinary:  Negative for involuntary urination.  Musculoskeletal:  Positive for joint pain, joint pain and morning stiffness. Negative for gait problem, joint swelling, myalgias, muscle weakness, muscle tenderness and myalgias.  Skin:  Negative for color change, rash, hair loss and sensitivity to sunlight.  Allergic/Immunologic:  Negative for susceptible to infections.  Neurological:  Negative for dizziness and headaches.  Hematological:  Negative for swollen glands.  Psychiatric/Behavioral:  Negative for depressed mood and sleep disturbance. The patient is not nervous/anxious.     PMFS History:  Patient Active Problem List   Diagnosis Date Noted   COVID-19 07/03/2021   Plantar fasciitis 03/14/2021   Class 1 obesity without serious comorbidity with body mass index (BMI) of 33.0 to 33.9 in adult 03/09/2020   Prediabetes 01/08/2017   Epistaxis, recurrent 12/05/2014   Menopausal syndrome 12/05/2014   Rheumatoid arthritis ([place]) 01/02/2012   Pure hypercholesterolemia 08/29/2009   Osteoarthritis, hands 08/24/2009   ROTATOR CUFF SYNDROME 08/04/2007   Hypothyroidism 05/19/2007   ALLERGIC RHINITIS, SEASONAL 05/19/2007    Past Medical History:  Diagnosis Date   Allergic rhinitis due to pollen    Arthritis    Collagen vascular disease ([place])    COVID-19 06/2021   Plantar fasciitis of right foot    Rheumatoid arthritis(714.0)    Unspecified hypothyroidism     Family History  Problem Relation Age of Onset   Hypertension Mother    Thyroid disease Mother    Nephrolithiasis Mother    Diabetes Father    Kidney Stones Sister    Colon cancer Neg Hx    Colon polyps Neg Hx    Esophageal cancer Neg Hx    Rectal cancer Neg Hx    Stomach cancer Neg Hx    Past Surgical History:  Procedure Laterality Date  BREAST BIOPSY  2003   right, negative   ENDOMETRIAL ABLATION  2005   FRACTURE SURGERY  01/01   right elbow   ROTATOR CUFF REPAIR  06/2008   R. Cuff tear   TAYLOR BUNIONECTOMY     right   TREATMENT FISTULA ANAL  1993   repair   Social History   Social History Narrative   No exercise   Diet: Moderate   She has a Sales promotion account executive   Lives with her partner   Immunization History  Administered Date(s) Administered   Influenza Inj Mdck Quad Pf 01/09/2018   Influenza,inj,Quad PF,6+ Mos 12/07/2015,  01/09/2018, 01/19/2019, 01/24/2020, 01/16/2021   Influenza-Unspecified 01/10/2015, 01/08/2017   PFIZER(Purple Top)SARS-COV-2 Vaccination 06/17/2019, 07/12/2019, 01/27/2020, 07/27/2020   Pfizer Covid-19 Vaccine Bivalent Booster 97yr & up 01/25/2021   Pneumococcal Conjugate-13 12/07/2015   Pneumococcal Polysaccharide-23 01/08/2017   Td 02/05/1998, 07/13/2008   Tdap 03/08/2019   Zoster Recombinat (Shingrix) 03/08/2019, 05/10/2019   Zoster, Live 09/09/2013     Objective: Vital Signs: BP 120/73 (BP Location: Left Arm, Patient Position: Sitting, Cuff Size: Normal)   Pulse 77   Resp 15   Ht '5\' 4"'$  (1.626 m)   Wt 199 lb (90.3 kg)   BMI 34.16 kg/m    Physical Exam Vitals and nursing note reviewed.  Constitutional:      Appearance: She is well-developed.  HENT:     Head: Normocephalic and atraumatic.  Eyes:     Conjunctiva/sclera: Conjunctivae normal.  Cardiovascular:     Rate and Rhythm: Normal rate and regular rhythm.     Heart sounds: Normal heart sounds.  Pulmonary:     Effort: Pulmonary effort is normal.     Breath sounds: Normal breath sounds.  Abdominal:     General: Bowel sounds are normal.     Palpations: Abdomen is soft.  Musculoskeletal:     Cervical back: Normal range of motion.  Lymphadenopathy:     Cervical: No cervical adenopathy.  Skin:    General: Skin is warm and dry.     Capillary Refill: Capillary refill takes less than 2 seconds.  Neurological:     Mental Status: She is alert and oriented to person, place, and time.  Psychiatric:        Behavior: Behavior normal.      Musculoskeletal Exam: Cervical spine was in good range of motion.  She had no tenderness over thoracic lumbar spine.  Shoulder joints are in good range of motion without any discomfort.  Right elbow joint contracture was unchanged.  Wrist joints and MCP joints with good range of motion without synovitis.  She had bilateral CMC PIP and DIP thickening with tenderness over left CMC joint.  Hip  joints and knee joints with good range of motion without any warmth swelling or effusion.  There is no tenderness over ankles or MTPs.  CDAI Exam: CDAI Score: -- Patient Global: 1 mm; Provider Global: 1 mm Swollen: --; Tender: -- Joint Exam 03/13/2022   No joint exam has been documented for this visit   There is currently no information documented on the homunculus. Go to the Rheumatology activity and complete the homunculus joint exam.  Investigation: No additional findings.  Imaging: No results found.  Recent Labs: Lab Results  Component Value Date   WBC 4.0 12/13/2021   HGB 14.6 12/13/2021   PLT 163 12/13/2021   NA 140 12/13/2021   K 5.2 12/13/2021   CL 105 12/13/2021   CO2 27 12/13/2021  GLUCOSE 107 (H) 12/13/2021   BUN 16 12/13/2021   CREATININE 0.83 12/13/2021   BILITOT 0.7 12/13/2021   ALKPHOS 66 03/07/2021   AST 28 12/13/2021   ALT 26 12/13/2021   PROT 7.4 12/13/2021   ALBUMIN 4.3 03/07/2021   CALCIUM 9.4 12/13/2021   GFRAA 89 09/12/2020   QFTBGOLDPLUS NEGATIVE 12/13/2021    Speciality Comments: No specialty comments available.  Procedures:  Small Joint Inj: L thumb CMC on 03/13/2022 3:13 PM Indications: pain Details: 27 G needle, ultrasound-guided radial approach  Spinal Needle: No  Medications: 20 mg triamcinolone acetonide 40 MG/ML; 0.5 mL lidocaine 1 % Aspirate: 0 mL Outcome: tolerated well, no immediate complications Procedure, treatment alternatives, risks and benefits explained, specific risks discussed. Consent was given by the patient. Immediately prior to procedure a time out was called to verify the correct patient, procedure, equipment, support staff and site/side marked as required. Patient was prepped and draped in the usual sterile fashion.     Allergies: Naproxen   Assessment / Plan:     Visit Diagnoses: Rheumatoid arthritis involving multiple sites with positive rheumatoid factor (HCC)-she has been taking Humira and methotrexate on  a regular basis without any interruption.  She denies any joint swelling or joint discomfort except in her left CMC joint.  She states that [facility] joint has been very painful and has been causing nocturnal pain.  She is interested in getting cortisone injection.  High risk medication use - Humira 40 mg sq injection every 14 days, methotrexate 2.5 mg 4 tablets every 7 days, and folic acid 1 mg 1 tablet daily. -Labs obtained on December 13, 2021 CBC and CMP were normal which were reviewed.  TB Gold was negative.  Will get labs today and every 3 months to monitor for drug toxicity.  She was advised to hold Humira and methotrexate if she develops an infection resume after the infection resolves.  Information regarding immunization was placed in the AVS.  She was also advised to get an annual skin examination to screen for skin cancer while she is on Humira.  Use of sunscreen was advised.  Plan: CBC with Differential/Platelet, COMPLETE METABOLIC PANEL WITH GFR  Arthritis of carpometacarpal (CMC) joint of left thumb-she has been having severe pain and discomfort in her left CMC joint.  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  She has been wearing a CMC brace and using Voltaren gel without any relief.  Per patient's request left CMC joint was injected with lidocaine and cortisone as described above after the informed consent was obtained.  Postprocedure instructions were given.  Patient tolerated the procedure well.  She was advised to continue to use CMC brace.  Primary osteoarthritis of both hands-she has bilateral CMC PIP and DIP thickening consistent with osteoarthritis.  Joint protection muscle strengthening was discussed.  Contracture of elbow joint, right-unchanged.  Plantar fasciitis of right foot -she had no recent recurrence of plantar fasciitis.  Under the care of Dr. Amalia Hailey.  Probable ganglion cyst of right plantar foot diagnosed by Dr. Amalia Hailey.  History of  hypothyroidism  Prediabetes  History of kidney stones-patient report recent episode of kidney stone.  History of hematuria  Orders: Orders Placed This Encounter  Procedures   Small Joint Inj   US Guided Needle Placement   CBC with Differential/Platelet   COMPLETE METABOLIC PANEL WITH GFR   No orders of the defined types were placed in this encounter.   Follow-Up Instructions:  No follow-ups on file.   Bo Merino, MD  Note - This record has been created using Editor, commissioning.  Chart creation errors have been sought, but may not always  have been located. Such creation errors do not reflect on  the standard of medical care.

## 2022-03-04 ENCOUNTER — Ambulatory Visit (AMBULATORY_SURGERY_CENTER): Payer: Self-pay

## 2022-03-04 VITALS — Ht 64.0 in | Wt 195.0 lb

## 2022-03-04 DIAGNOSIS — Z1211 Encounter for screening for malignant neoplasm of colon: Secondary | ICD-10-CM

## 2022-03-04 MED ORDER — NA SULFATE-K SULFATE-MG SULF 17.5-3.13-1.6 GM/177ML PO SOLN: 1.0000 | Freq: Once | ORAL | 0 refills | Status: AC

## 2022-03-04 NOTE — Progress Notes (Signed)

## 2022-03-08 ENCOUNTER — Other Ambulatory Visit: Payer: Self-pay | Admitting: Physician Assistant

## 2022-03-09 NOTE — Telephone Encounter (Signed)
Next Visit: 03/13/2022  Last Visit: 09/19/2021  Last Fill: 12/10/2021  DX:  Rheumatoid arthritis involving multiple sites with positive rheumatoid factor   Current Dose per office note 09/19/2021: methotrexate 2.5 mg 4 tablets every 7 days   Labs: 12/13/2021 CBC and CMP are stable.  TB Gold is negative.   Okay to refill MTX?

## 2022-03-10 ENCOUNTER — Telehealth: Payer: Self-pay | Admitting: Family Medicine

## 2022-03-10 DIAGNOSIS — E7800 Pure hypercholesterolemia, unspecified: Secondary | ICD-10-CM

## 2022-03-10 DIAGNOSIS — R7303 Prediabetes: Secondary | ICD-10-CM

## 2022-03-10 DIAGNOSIS — E039 Hypothyroidism, unspecified: Secondary | ICD-10-CM

## 2022-03-10 NOTE — Telephone Encounter (Signed)
Patient can update lab work at upcoming visit.

## 2022-03-10 NOTE — Telephone Encounter (Signed)
-----   Message from Velna Hatchet, RT sent at 02/25/2022  8:57 AM EST ----- Regarding: Thu 12/7 lab Patient is scheduled for cpx, please order future labs.  Thanks, Anda Kraft

## 2022-03-13 ENCOUNTER — Other Ambulatory Visit (INDEPENDENT_AMBULATORY_CARE_PROVIDER_SITE_OTHER): Payer: BC Managed Care – PPO

## 2022-03-13 ENCOUNTER — Ambulatory Visit: Payer: BC Managed Care – PPO | Attending: Rheumatology | Admitting: Rheumatology

## 2022-03-13 ENCOUNTER — Ambulatory Visit (INDEPENDENT_AMBULATORY_CARE_PROVIDER_SITE_OTHER): Payer: BC Managed Care – PPO

## 2022-03-13 ENCOUNTER — Encounter: Payer: Self-pay | Admitting: Rheumatology

## 2022-03-13 VITALS — BP 120/73 | HR 77 | Resp 15 | Ht 64.0 in | Wt 199.0 lb

## 2022-03-13 DIAGNOSIS — Z87442 Personal history of urinary calculi: Secondary | ICD-10-CM

## 2022-03-13 DIAGNOSIS — M19041 Primary osteoarthritis, right hand: Secondary | ICD-10-CM

## 2022-03-13 DIAGNOSIS — E7800 Pure hypercholesterolemia, unspecified: Secondary | ICD-10-CM | POA: Diagnosis not present

## 2022-03-13 DIAGNOSIS — R7303 Prediabetes: Secondary | ICD-10-CM

## 2022-03-13 DIAGNOSIS — M722 Plantar fascial fibromatosis: Secondary | ICD-10-CM

## 2022-03-13 DIAGNOSIS — M24521 Contracture, right elbow: Secondary | ICD-10-CM

## 2022-03-13 DIAGNOSIS — Z87448 Personal history of other diseases of urinary system: Secondary | ICD-10-CM

## 2022-03-13 DIAGNOSIS — M1812 Unilateral primary osteoarthritis of first carpometacarpal joint, left hand: Secondary | ICD-10-CM | POA: Diagnosis not present

## 2022-03-13 DIAGNOSIS — M0579 Rheumatoid arthritis with rheumatoid factor of multiple sites without organ or systems involvement: Secondary | ICD-10-CM

## 2022-03-13 DIAGNOSIS — E039 Hypothyroidism, unspecified: Secondary | ICD-10-CM | POA: Diagnosis not present

## 2022-03-13 DIAGNOSIS — Z79899 Other long term (current) drug therapy: Secondary | ICD-10-CM

## 2022-03-13 DIAGNOSIS — Z8639 Personal history of other endocrine, nutritional and metabolic disease: Secondary | ICD-10-CM

## 2022-03-13 DIAGNOSIS — M19042 Primary osteoarthritis, left hand: Secondary | ICD-10-CM

## 2022-03-13 LAB — T4, FREE: Free T4: 0.73 ng/dL (ref 0.60–1.60)

## 2022-03-13 LAB — LIPID PANEL
Cholesterol: 195 mg/dL (ref 0–200)
HDL: 76.7 mg/dL (ref >39.00)
LDL Cholesterol: 98 mg/dL (ref 0–99)
NonHDL: 118.76
Total CHOL/HDL Ratio: 3
Triglycerides: 102 mg/dL (ref 0.0–149.0)
VLDL: 20.4 mg/dL (ref 0.0–40.0)

## 2022-03-13 LAB — T3, FREE: T3, Free: 3.1 pg/mL (ref 2.3–4.2)

## 2022-03-13 LAB — HEMOGLOBIN A1C: Hgb A1c MFr Bld: 5.5 % (ref 4.6–6.5)

## 2022-03-13 LAB — TSH: TSH: 1.77 u[IU]/mL (ref 0.35–5.50)

## 2022-03-13 MED ORDER — TRIAMCINOLONE ACETONIDE 40 MG/ML IJ SUSP
20.0000 mg | INTRAMUSCULAR | Status: AC | PRN
  Administered 2022-03-13: 20 mg via INTRA_ARTICULAR

## 2022-03-13 MED ORDER — LIDOCAINE HCL 1 % IJ SOLN
0.5000 mL | INTRAMUSCULAR | Status: AC | PRN
  Administered 2022-03-13: .5 mL

## 2022-03-13 NOTE — Patient Instructions (Signed)
Standing Labs We placed an order today for your standing lab work.   Please have your standing labs drawn in March and every 3 months  Please have your labs drawn 2 weeks prior to your appointment so that the provider can discuss your lab results at your appointment.  Please note that you may see your imaging and lab results in [facility] before we have reviewed them. We will contact you once all results are reviewed. Please allow our office up to 72 hours to thoroughly review all of the results before contacting the office for clarification of your results.  Lab hours are:   Monday through Thursday from 8:00 am -12:30 pm and 1:00 pm-5:00 pm and Friday from 8:00 am-12:00 pm.  Please be advised, all patients with office appointments requiring lab work will take precedent over walk-in lab work.   Labs are drawn by [facility]. Please bring your co-pay at the time of your lab draw.  You may receive a bill from [facility] for your lab work.  Please note if you are on Hydroxychloroquine and and an order has been placed for a Hydroxychloroquine level, you will need to have it drawn 4 hours or more after your last dose.  If you wish to have your labs drawn at another location, please call the office 24 hours in advance so we can fax the orders.  The office is located at [address], [place], [place], [place] [address] No appointment is necessary.    If you have any questions regarding directions or hours of operation,  please call [phone].   As a reminder, please drink plenty of water prior to coming for your lab work. Thanks!   Vaccines You are taking a medication(s) that can suppress your immune system.  The following immunizations are recommended: Flu annually Covid-19  Td/Tdap (tetanus, diphtheria, pertussis) every 10 years Pneumonia (Prevnar 15 then Pneumovax 23 at least 1 year apart.  Alternatively, can take Prevnar 20 without needing additional dose) Shingrix: 2 doses from 4 weeks to  6 months apart  Please check with your PCP to make sure you are up to date.   If you have signs or symptoms of an infection or start antibiotics: First, call your PCP for workup of your infection. Hold your medication through the infection, until you complete your antibiotics, and until symptoms resolve if you take the following: Injectable medication (Actemra, Benlysta, Cimzia, Cosentyx, Enbrel, Humira, Kevzara, Orencia, Remicade, Simponi, Stelara, Taltz, Tremfya) Methotrexate Leflunomide (Arava) Mycophenolate (Cellcept) Roma Kayser, or Rinvoq  Get an annual skin examination to screen for skin cancer while you are on Humira.

## 2022-03-13 NOTE — Progress Notes (Signed)
No critical labs need to be addressed urgently. We will discuss labs in detail at upcoming office visit.   

## 2022-03-14 LAB — COMPLETE METABOLIC PANEL WITH GFR
AG Ratio: 1.6 (calc) (ref 1.0–2.5)
ALT: 15 U/L (ref 6–29)
AST: 18 U/L (ref 10–35)
Albumin: 4.3 g/dL (ref 3.6–5.1)
Alkaline phosphatase (APISO): 76 U/L (ref 37–153)
BUN: 19 mg/dL (ref 7–25)
CO2: 27 mmol/L (ref 20–32)
Calcium: 9.4 mg/dL (ref 8.6–10.4)
Chloride: 105 mmol/L (ref 98–110)
Creat: 0.87 mg/dL (ref 0.50–1.05)
Globulin: 2.7 g/dL (calc) (ref 1.9–3.7)
Glucose, Bld: 99 mg/dL (ref 65–99)
Potassium: 5 mmol/L (ref 3.5–5.3)
Sodium: 141 mmol/L (ref 135–146)
Total Bilirubin: 0.3 mg/dL (ref 0.2–1.2)
Total Protein: 7 g/dL (ref 6.1–8.1)
eGFR: 76 mL/min/{1.73_m2} (ref >=60)

## 2022-03-14 LAB — CBC WITH DIFFERENTIAL/PLATELET
Absolute Monocytes: 459 cells/uL (ref 200–950)
Basophils Absolute: 41 cells/uL (ref 0–200)
Basophils Relative: 0.8 %
Eosinophils Absolute: 158 cells/uL (ref 15–500)
Eosinophils Relative: 3.1 %
HCT: 42.9 % (ref 35.0–45.0)
Hemoglobin: 15 g/dL (ref 11.7–15.5)
Lymphs Abs: 2132 cells/uL (ref 850–3900)
MCH: 34.2 pg — ABNORMAL HIGH (ref 27.0–33.0)
MCHC: 35 g/dL (ref 32.0–36.0)
MCV: 97.9 fL (ref 80.0–100.0)
MPV: 13.4 fL — ABNORMAL HIGH (ref 7.5–12.5)
Monocytes Relative: 9 %
Neutro Abs: 2310 cells/uL (ref 1500–7800)
Neutrophils Relative %: 45.3 %
Platelets: 158 10*3/uL (ref 140–400)
RBC: 4.38 10*6/uL (ref 3.80–5.10)
RDW: 12.5 % (ref 11.0–15.0)
Total Lymphocyte: 41.8 %
WBC: 5.1 10*3/uL (ref 3.8–10.8)

## 2022-03-14 NOTE — Progress Notes (Signed)
CBC and CMP are normal.

## 2022-03-20 ENCOUNTER — Ambulatory Visit (INDEPENDENT_AMBULATORY_CARE_PROVIDER_SITE_OTHER): Payer: BC Managed Care – PPO | Admitting: Family Medicine

## 2022-03-20 ENCOUNTER — Telehealth: Payer: Self-pay

## 2022-03-20 ENCOUNTER — Other Ambulatory Visit (HOSPITAL_COMMUNITY): Payer: Self-pay

## 2022-03-20 ENCOUNTER — Encounter: Payer: Self-pay | Admitting: Family Medicine

## 2022-03-20 VITALS — BP 128/70 | HR 57 | Temp 97.4°F | Ht 63.25 in | Wt 198.2 lb

## 2022-03-20 DIAGNOSIS — E7800 Pure hypercholesterolemia, unspecified: Secondary | ICD-10-CM

## 2022-03-20 DIAGNOSIS — E2839 Other primary ovarian failure: Secondary | ICD-10-CM | POA: Diagnosis not present

## 2022-03-20 DIAGNOSIS — E039 Hypothyroidism, unspecified: Secondary | ICD-10-CM | POA: Diagnosis not present

## 2022-03-20 DIAGNOSIS — M0579 Rheumatoid arthritis with rheumatoid factor of multiple sites without organ or systems involvement: Secondary | ICD-10-CM

## 2022-03-20 DIAGNOSIS — E66811 Obesity, class 1: Secondary | ICD-10-CM

## 2022-03-20 DIAGNOSIS — Z6833 Body mass index (BMI) 33.0-33.9, adult: Secondary | ICD-10-CM

## 2022-03-20 DIAGNOSIS — Z0000 Encounter for general adult medical examination without abnormal findings: Secondary | ICD-10-CM

## 2022-03-20 DIAGNOSIS — E6609 Other obesity due to excess calories: Secondary | ICD-10-CM

## 2022-03-20 DIAGNOSIS — R7303 Prediabetes: Secondary | ICD-10-CM

## 2022-03-20 MED ORDER — TRAMADOL HCL 50 MG PO TABS: 50.0000 mg | ORAL_TABLET | Freq: Every day | ORAL | 0 refills | Status: AC | PRN

## 2022-03-20 NOTE — Assessment & Plan Note (Signed)
Encouraged exercise, weight loss, healthy eating habits. ? ?

## 2022-03-20 NOTE — Assessment & Plan Note (Signed)
Resolved with lifestyle change.

## 2022-03-20 NOTE — Assessment & Plan Note (Signed)
LDL at goal less than 100 with diet control The 10-year ASCVD risk score (Arnett DK, et al., 2019) is: 2.6%   Values used to calculate the score:     Age: 61 years     Sex: Female     Is Non-Hispanic African American: No     Diabetic: No     Tobacco smoker: No     Systolic Blood Pressure: 623 mmHg     Is BP treated: No     HDL Cholesterol: 76.7 mg/dL     Total Cholesterol: 195 mg/dL

## 2022-03-20 NOTE — Telephone Encounter (Signed)
Pharmacy Patient Advocate Encounter   Received notification from [facility] that prior authorization for Tramadol '50mg'$  is required/requested.   PA submitted on 03/20/22 to [place] [employer] via CoverMyMeds  Key  [id number]  Status is pending

## 2022-03-20 NOTE — Patient Instructions (Addendum)
Please call Mammogram and/or Bone Density appointment at [facility].

## 2022-03-20 NOTE — Assessment & Plan Note (Signed)
Stable, chronic.  Continue current medication.  Levothyroxine 100 mcg daily

## 2022-03-20 NOTE — Telephone Encounter (Signed)
Noted  

## 2022-03-20 NOTE — Assessment & Plan Note (Signed)
Chronic, followed by Dr. Cyndie Chime with our rheumatology. Treated and well-controlled with methotrexate and Humira.  Using tramadol 50 mg daily as needed #30 in 1 year

## 2022-03-20 NOTE — Progress Notes (Signed)
Patient ID: Amy Hart, female    DOB: 16-Mar-1961, 61 y.o.   MRN: [id number]  This visit was conducted in person.  BP 128/70   Pulse (!) 57   Temp (!) 97.4 F (36.3 C) (Temporal)   Ht 5' 3.25" (1.607 m)   Wt 198 lb 3.2 oz (89.9 kg)   SpO2 97%   BMI 34.83 kg/m    CC:  Chief Complaint  Patient presents with   Annual Exam    Requests rx for tramadol.     Subjective:   HPI: Amy Hart is a 61 y.o. female presenting on 03/20/2022 for Annual Exam (Requests rx for tramadol. )  The patient presents for complete physical and review of chronic health problems. He/She also has the following acute concerns today: none  Elevated Cholesterol:  well controlled with diet Lab Results  Component Value Date   CHOL 195 03/13/2022   HDL 76.70 03/13/2022   LDLCALC 98 03/13/2022   LDLDIRECT 131.3 11/25/2011   TRIG 102.0 03/13/2022   CHOLHDL 3 03/13/2022  The 10-year ASCVD risk score (Arnett DK, et al., 2019) is: 2.6%   Values used to calculate the score:     Age: 42 years     Sex: Female     Is Non-Hispanic African American: No     Diabetic: No     Tobacco smoker: No     Systolic Blood Pressure: 174 mmHg     Is BP treated: No     HDL Cholesterol: 76.7 mg/dL     Total Cholesterol: 195 mg/dL Using medications without problems: Muscle aches:  Diet compliance: heart healthy diet Exercise: off and on Other complaints:  Prediabetes  resolved Lab Results  Component Value Date   HGBA1C 5.5 03/13/2022   Hypothyroidism At goal on levo 100 mcg Lab Results  Component Value Date   TSH 1.77 03/13/2022   RA:  On methotrexate, Humira. Followed by Dr, Estanislado Pandy Rheum.   Using tramadol 50 mg as needed.. # 30 in 1 year.  Relevant past medical, surgical, family and social history reviewed and updated as indicated. Interim medical history since our last visit reviewed. Allergies and medications reviewed and updated. Outpatient Medications Prior to Visit  Medication Sig Dispense  Refill   Cholecalciferol (VITAMIN D-3) 1000 units CAPS Take 1 capsule by mouth daily.     folic acid (FOLVITE) 1 MG tablet TAKE 1 TABLET BY MOUTH EVERY DAY (Patient taking differently: Take 1 mg by mouth 2 (two) times daily.) 90 tablet 3   HUMIRA PEN 40 MG/0.4ML PNKT INJECT 1 PEN UNDER THE SKIN EVERY 14 DAYS. 2 each 2   levothyroxine (SYNTHROID) 100 MCG tablet TAKE 1 TABLET BY MOUTH EVERY DAY 90 tablet 3   methotrexate (RHEUMATREX) 2.5 MG tablet TAKE 4 TABLETS (10 MG TOTAL) BY MOUTH ONCE A WEEK 48 tablet 0   Multiple Vitamins-Minerals (MULTIVITAMIN ADULT PO) Take by mouth daily.     TURMERIC PO Take 1,000 mg by mouth daily.     vitamin C (ASCORBIC ACID) 500 MG tablet Take 500 mg by mouth daily.     No facility-administered medications prior to visit.     Per HPI unless specifically indicated in ROS section below Review of Systems  Constitutional:  Negative for fatigue and fever.  HENT:  Negative for congestion.   Eyes:  Negative for pain.  Respiratory:  Negative for cough and shortness of breath.   Cardiovascular:  Negative for chest pain, palpitations and leg  swelling.  Gastrointestinal:  Negative for abdominal pain.  Genitourinary:  Negative for dysuria and vaginal bleeding.  Musculoskeletal:  Negative for back pain.  Neurological:  Negative for syncope, light-headedness and headaches.  Psychiatric/Behavioral:  Negative for dysphoric mood.    Objective:  BP 128/70   Pulse (!) 57   Temp (!) 97.4 F (36.3 C) (Temporal)   Ht 5' 3.25" (1.607 m)   Wt 198 lb 3.2 oz (89.9 kg)   SpO2 97%   BMI 34.83 kg/m   Wt Readings from Last 3 Encounters:  03/20/22 198 lb 3.2 oz (89.9 kg)  03/13/22 199 lb (90.3 kg)  03/04/22 195 lb (88.5 kg)      Physical Exam    Results for orders placed or performed in visit on 03/13/22  CBC with Differential/Platelet  Result Value Ref Range   WBC 5.1 3.8 - 10.8 Thousand/uL   RBC 4.38 3.80 - 5.10 Million/uL   Hemoglobin 15.0 11.7 - 15.5 g/dL   HCT  42.9 35.0 - 45.0 %   MCV 97.9 80.0 - 100.0 fL   MCH 34.2 (H) 27.0 - 33.0 pg   MCHC 35.0 32.0 - 36.0 g/dL   RDW 12.5 11.0 - 15.0 %   Platelets 158 140 - 400 Thousand/uL   MPV 13.4 (H) 7.5 - 12.5 fL   Neutro Abs 2,310 1,500 - 7,800 cells/uL   Lymphs Abs 2,132 850 - 3,900 cells/uL   Absolute Monocytes 459 200 - 950 cells/uL   Eosinophils Absolute 158 15 - 500 cells/uL   Basophils Absolute 41 0 - 200 cells/uL   Neutrophils Relative % 45.3 %   Total Lymphocyte 41.8 %   Monocytes Relative 9.0 %   Eosinophils Relative 3.1 %   Basophils Relative 0.8 %  COMPLETE METABOLIC PANEL WITH GFR  Result Value Ref Range   Glucose, Bld 99 65 - 99 mg/dL   BUN 19 7 - 25 mg/dL   Creat 0.87 0.50 - 1.05 mg/dL   eGFR 76 > OR = 60 mL/min/1.43m   BUN/Creatinine Ratio SEE NOTE: 6 - 22 (calc)   Sodium 141 135 - 146 mmol/L   Potassium 5.0 3.5 - 5.3 mmol/L   Chloride 105 98 - 110 mmol/L   CO2 27 20 - 32 mmol/L   Calcium 9.4 8.6 - 10.4 mg/dL   Total Protein 7.0 6.1 - 8.1 g/dL   Albumin 4.3 3.6 - 5.1 g/dL   Globulin 2.7 1.9 - 3.7 g/dL (calc)   AG Ratio 1.6 1.0 - 2.5 (calc)   Total Bilirubin 0.3 0.2 - 1.2 mg/dL   Alkaline phosphatase (APISO) 76 37 - 153 U/L   AST 18 10 - 35 U/L   ALT 15 6 - 29 U/L     COVID 19 screen:  No recent travel or known exposure to COVID19 The patient denies respiratory symptoms of COVID 19 at this time. The importance of social distancing was discussed today.   Assessment and Plan The patient's preventative maintenance and recommended screening tests for an annual wellness exam were reviewed in full today. Brought up to date unless services declined.  Counselled on the importance of diet, exercise, and its role in overall health and mortality. The patient's FH and SH was reviewed, including their home life, tobacco status, and drug and alcohol status.   Vaccines: flu, PCV 13 uptodate, PCV23 2018 and PCV 23 in 5 years, S/P COVID x 3  Nonsmoker.   PAP/DVE: Q 5year, last  done 03/2019 nml , no HPV ,  DVE not indicated.. Asymptomatic.  Mammo: 03/2021, q2 years no family history for breast cancer. Colon: 01/2012 Dr. Carlean Purl, nml repeat in 10 years.Marland Kitchen  scheduled next week.  STD screening: refused  Hep C : done   DEXA: consider  age 68, history of  prednisone use.   Problem List Items Addressed This Visit     Class 1 obesity without serious comorbidity with body mass index (BMI) of 33.0 to 33.9 in adult    Encouraged exercise, weight loss, healthy eating habits.       Hypothyroidism    Stable, chronic.  Continue current medication.  Levothyroxine 100 mcg daily      Prediabetes    Resolved with lifestyle change.      Pure hypercholesterolemia    LDL at goal less than 100 with diet control The 10-year ASCVD risk score (Arnett DK, et al., 2019) is: 2.6%   Values used to calculate the score:     Age: 53 years     Sex: Female     Is Non-Hispanic African American: No     Diabetic: No     Tobacco smoker: No     Systolic Blood Pressure: 156 mmHg     Is BP treated: No     HDL Cholesterol: 76.7 mg/dL     Total Cholesterol: 195 mg/dL       Rheumatoid arthritis (HCC)    Chronic, followed by Dr. Cyndie Chime with our rheumatology. Treated and well-controlled with methotrexate and Humira.  Using tramadol 50 mg daily as needed #30 in 1 year      Relevant Medications   traMADol (ULTRAM) 50 MG tablet   Other Visit Diagnoses     Routine general medical examination at a health care facility    -  Primary   Estrogen deficiency       Relevant Orders   DG Bone Density          Eliezer Lofts, MD

## 2022-03-24 ENCOUNTER — Other Ambulatory Visit: Payer: Self-pay | Admitting: Family Medicine

## 2022-03-25 ENCOUNTER — Telehealth: Payer: Self-pay | Admitting: Family Medicine

## 2022-03-25 ENCOUNTER — Encounter: Payer: Self-pay | Admitting: Internal Medicine

## 2022-03-25 ENCOUNTER — Encounter: Payer: Self-pay | Admitting: Certified Registered Nurse Anesthetist

## 2022-03-25 NOTE — Telephone Encounter (Signed)
Spoke with Keylin to let her know the PA for her Tramadol was denied by [facility].  Patient states that they just paid out of pocket for the medication.  It only cost her like $13.00.

## 2022-03-25 NOTE — Telephone Encounter (Signed)
Let patient know it looks like tramadol prescription was not covered by her insurance.  Notification from [facility] shield suggests that the amount dispensed per the prescription was not covered.  "Excess quantities are not covered"  I had sent in 50 mg tramadol 1 tablet daily as needed #30 so I am not sure what excess they are talking about.  She can pay for it out-of-pocket if she would like.

## 2022-03-25 NOTE — Telephone Encounter (Signed)
Alisha from [facility] called in stated pt needs an order fax in for pt to have her Dexa done on 03/28/22  Fax [phone] and can be reach at [phone]

## 2022-03-25 NOTE — Telephone Encounter (Signed)
Pharmacy Patient Advocate Encounter  Received notification from [facility] that the request for prior authorization for Tramadol 50 MG has been denied due to  .

## 2022-03-25 NOTE — Telephone Encounter (Signed)
Dexa Scan order faxed as requested.

## 2022-03-26 ENCOUNTER — Ambulatory Visit (AMBULATORY_SURGERY_CENTER): Payer: BC Managed Care – PPO | Admitting: Internal Medicine

## 2022-03-26 ENCOUNTER — Encounter: Payer: Self-pay | Admitting: Internal Medicine

## 2022-03-26 VITALS — BP 126/75 | HR 61 | Temp 96.9°F | Resp 14 | Ht 64.0 in | Wt 195.0 lb

## 2022-03-26 DIAGNOSIS — Z1211 Encounter for screening for malignant neoplasm of colon: Secondary | ICD-10-CM | POA: Diagnosis not present

## 2022-03-26 DIAGNOSIS — D123 Benign neoplasm of transverse colon: Secondary | ICD-10-CM | POA: Diagnosis not present

## 2022-03-26 MED ORDER — SODIUM CHLORIDE 0.9 % IV SOLN: 500.0000 mL | Freq: Once | INTRAVENOUS | Status: DC

## 2022-03-26 NOTE — Progress Notes (Signed)
Called to room to assist during endoscopic procedure.  Patient ID and intended procedure confirmed with present staff. Received instructions for my participation in the procedure from the performing physician.  

## 2022-03-26 NOTE — Progress Notes (Signed)
[facility] History and Physical   Primary Care Physician:  Jinny Sanders, MD   Reason for Procedure:   CRCa screening  Plan:    colonoscopy     HPI: Amy Hart is a 61 y.o. female for repeat screening exam, negative colonoscopy 2013   Past Medical History:  Diagnosis Date   Allergic rhinitis due to pollen    Arthritis    Collagen vascular disease ([place])    COVID-19 06/2021   Plantar fasciitis of right foot    Rheumatoid arthritis(714.0)    Unspecified hypothyroidism     Past Surgical History:  Procedure Laterality Date   BREAST BIOPSY  2003   right, negative   ENDOMETRIAL ABLATION  2005   FRACTURE SURGERY  01/01   right elbow   ROTATOR CUFF REPAIR  06/2008   R. Cuff tear   TAYLOR BUNIONECTOMY     right   TREATMENT FISTULA ANAL  1993   repair    Prior to Admission medications   Medication Sig Start Date End Date Taking? Authorizing Provider  Cholecalciferol (VITAMIN D-3) 1000 units CAPS Take 1 capsule by mouth daily.   Yes [provider]  folic acid (FOLVITE) 1 MG tablet TAKE 1 TABLET BY MOUTH EVERY DAY Patient taking differently: Take 1 mg by mouth 2 (two) times daily. 12/05/21  Yes Ofilia Neas, PA-C  levothyroxine (SYNTHROID) 100 MCG tablet TAKE 1 TABLET BY MOUTH EVERY DAY 03/24/22  Yes Copland, Frederico Hamman, MD  methotrexate (RHEUMATREX) 2.5 MG tablet TAKE 4 TABLETS (10 MG TOTAL) BY MOUTH ONCE A WEEK 03/10/22  Yes Ofilia Neas, PA-C  Multiple Vitamins-Minerals (MULTIVITAMIN ADULT PO) Take by mouth daily.   Yes [provider]  TURMERIC PO Take 1,000 mg by mouth daily.   Yes [provider]  vitamin C (ASCORBIC ACID) 500 MG tablet Take 500 mg by mouth daily.   Yes [provider]  HUMIRA PEN 40 MG/0.4ML PNKT INJECT 1 PEN UNDER THE SKIN EVERY 14 DAYS. 01/14/22   Ofilia Neas, PA-C  traMADol (ULTRAM) 50 MG tablet Take 1 tablet (50 mg total) by mouth daily as needed. 03/20/22 04/19/22  Jinny Sanders, MD     Current Outpatient Medications  Medication Sig Dispense Refill   Cholecalciferol (VITAMIN D-3) 1000 units CAPS Take 1 capsule by mouth daily.     folic acid (FOLVITE) 1 MG tablet TAKE 1 TABLET BY MOUTH EVERY DAY (Patient taking differently: Take 1 mg by mouth 2 (two) times daily.) 90 tablet 3   levothyroxine (SYNTHROID) 100 MCG tablet TAKE 1 TABLET BY MOUTH EVERY DAY 90 tablet 3   methotrexate (RHEUMATREX) 2.5 MG tablet TAKE 4 TABLETS (10 MG TOTAL) BY MOUTH ONCE A WEEK 48 tablet 0   Multiple Vitamins-Minerals (MULTIVITAMIN ADULT PO) Take by mouth daily.     TURMERIC PO Take 1,000 mg by mouth daily.     vitamin C (ASCORBIC ACID) 500 MG tablet Take 500 mg by mouth daily.     HUMIRA PEN 40 MG/0.4ML PNKT INJECT 1 PEN UNDER THE SKIN EVERY 14 DAYS. 2 each 2   traMADol (ULTRAM) 50 MG tablet Take 1 tablet (50 mg total) by mouth daily as needed. 30 tablet 0   Current Facility-Administered Medications  Medication Dose Route Frequency Provider Last Rate Last Admin   0.9 %  sodium chloride infusion  500 mL Intravenous Once Gatha Mayer, MD        Allergies as of 03/26/2022 - Review Complete  03/26/2022  Allergen Reaction Noted   Naproxen  04/20/2006    Family History  Problem Relation Age of Onset   Hypertension Mother    Thyroid disease Mother    Nephrolithiasis Mother    Diabetes Father    Kidney Stones Sister    Colon cancer Neg Hx    Colon polyps Neg Hx    Esophageal cancer Neg Hx    Rectal cancer Neg Hx    Stomach cancer Neg Hx     Social History   Socioeconomic History   Marital status: Married    Spouse name: Not on file   Number of children: 0   Years of education: Not on file   Highest education level: Not on file  Occupational History   Occupation: Geologist, engineering at [employer]: [employer]  Tobacco Use   Smoking status: Never    Passive exposure: Past   Smokeless tobacco: Never  Vaping Use   Vaping Use: Never used  Substance  and Sexual Activity   Alcohol use: Yes    Alcohol/week: 2.0 standard drinks of alcohol    Types: 2 Standard drinks or equivalent per week   Drug use: No   Sexual activity: Not on file  Other Topics Concern   Not on file  Social History Narrative   No exercise   Diet: Moderate   She has a Sales promotion account executive   Lives with her partner   Social Determinants of Health   Financial Resource Strain: Not on file  Food Insecurity: Not on file  Transportation Needs: Not on file  Physical Activity: Not on file  Stress: Not on file  Social Connections: Not on file  Intimate Partner Violence: Not on file    Review of Systems:  All other review of systems negative except as mentioned in the HPI.  Physical Exam: Vital signs BP 127/74   Pulse (!) 57   Temp (!) 96.9 F (36.1 C)   Resp 15   Ht '5\' 4"'$  (1.626 m)   Wt 195 lb (88.5 kg)   SpO2 99%   BMI 33.47 kg/m   General:   Alert,  Well-developed, well-nourished, pleasant and cooperative in NAD Lungs:  Clear throughout to auscultation.   Heart:  Regular rate and rhythm; no murmurs, clicks, rubs,  or gallops. Abdomen:  Soft, nontender and nondistended. Normal bowel sounds.   Neuro/Psych:  Alert and cooperative. Normal mood and affect. A and O x 3   '@Amy Hart'$  Simonne Maffucci, MD, [facility] [phone] (pager) 03/26/2022 7:54 AM@

## 2022-03-26 NOTE — Progress Notes (Signed)
Report given to PACU, vss 

## 2022-03-26 NOTE — Op Note (Signed)
[facility] Patient Name: Amy Hart Procedure Date: 03/26/2022 7:52 AM MRN: [id number] Endoscopist: Gatha Mayer , MD, [id number] Age: 61 Referring MD:  Date of Birth: [birth year] Gender: Female Account #: [id number] Procedure:                Colonoscopy Indications:              Screening for colorectal malignant neoplasm, Last                            colonoscopy: 2013 Medicines:                Monitored Anesthesia Care Procedure:                Pre-Anesthesia Assessment:                           - Prior to the procedure, a History and Physical                            was performed, and patient medications and                            allergies were reviewed. The patient's tolerance of                            previous anesthesia was also reviewed. The risks                            and benefits of the procedure and the sedation                            options and risks were discussed with the patient.                            All questions were answered, and informed consent                            was obtained. Prior Anticoagulants: The patient has                            taken no anticoagulant or antiplatelet agents. ASA                            Grade Assessment: II - A patient with mild systemic                            disease. After reviewing the risks and benefits,                            the patient was deemed in satisfactory condition to                            undergo the procedure.  After obtaining informed consent, the colonoscope                            was passed under direct vision. Throughout the                            procedure, the patient's blood pressure, pulse, and                            oxygen saturations were monitored continuously. The                            Olympus PCF-H190DL (#[id number]) Colonoscope was                            introduced through the anus and advanced to  the the                            cecum, identified by appendiceal orifice and                            ileocecal valve. The colonoscopy was performed                            without difficulty. The patient tolerated the                            procedure well. The quality of the bowel                            preparation was excellent. The bowel preparation                            used was SUPREP via split dose instruction. The                            ileocecal valve, appendiceal orifice, and rectum                            were photographed. Scope In: 7:59:32 AM Scope Out: 8:17:09 AM Scope Withdrawal Time: 0 hours 13 minutes 1 second  Total Procedure Duration: 0 hours 17 minutes 37 seconds  Findings:                 The perianal and digital rectal examinations were                            normal.                           A diminutive polyp was found in the transverse                            colon. The polyp was sessile. The polyp was removed  with a cold snare. Resection and retrieval were                            complete. Verification of patient identification                            for the specimen was done. Estimated blood loss was                            minimal.                           There was a small lipoma, in the transverse colon.                           A few small-mouthed diverticula were found in the                            sigmoid colon.                           The exam was otherwise without abnormality on                            direct and retroflexion views. Complications:            No immediate complications. Estimated Blood Loss:     Estimated blood loss was minimal. Impression:               - One diminutive polyp in the transverse colon,                            removed with a cold snare. Resected and retrieved.                           - Small lipoma in the transverse colon.                            - Diverticulosis in the sigmoid colon.                           - The examination was otherwise normal on direct                            and retroflexion views. Recommendation:           - Patient has a contact number available for                            emergencies. The signs and symptoms of potential                            delayed complications were discussed with the                            patient. Return to normal activities tomorrow.  Written discharge instructions were provided to the                            patient.                           - Resume previous diet.                           - Continue present medications.                           - Repeat colonoscopy is recommended. The                            colonoscopy date will be determined after pathology                            results from today's exam become available for                            review. Gatha Mayer, MD 03/26/2022 8:25:35 AM This report has been signed electronically.

## 2022-03-26 NOTE — Progress Notes (Signed)
Pt's states no medical or surgical changes since previsit or office visit. 

## 2022-03-26 NOTE — Patient Instructions (Addendum)
There was one tiny polyp - I removed it. Otherwise I saw some diverticulosis -  thickened muscle rings and pouches in the colon wall. Please read the handout about this condition, and a lipoma (fatty cells in the wall and not a problem.  I will let you know pathology results and when to have another routine colonoscopy by mail and/or My Chart.  Please resume normal medications and diet.  I appreciate the opportunity to care for you. Gatha Mayer, MD, [facility]  Handout on polyps and diverticulosis given.  Resume previous diet and continue current medications.   YOU HAD AN ENDOSCOPIC PROCEDURE TODAY AT [facility]:   Refer to the procedure report that was given to you for any specific questions about what was found during the examination.  If the procedure report does not answer your questions, please call your gastroenterologist to clarify.  If you requested that your care partner not be given the details of your procedure findings, then the procedure report has been included in a sealed envelope for you to review at your convenience later.  YOU SHOULD EXPECT: Some feelings of bloating in the abdomen. Passage of more gas than usual.  Walking can help get rid of the air that was put into your GI tract during the procedure and reduce the bloating. If you had a lower endoscopy (such as a colonoscopy or flexible sigmoidoscopy) you may notice spotting of blood in your stool or on the toilet paper. If you underwent a bowel prep for your procedure, you may not have a normal bowel movement for a few days.  Please Note:  You might notice some irritation and congestion in your nose or some drainage.  This is from the oxygen used during your procedure.  There is no need for concern and it should clear up in a day or so.  SYMPTOMS TO REPORT IMMEDIATELY:  Following lower endoscopy (colonoscopy or flexible sigmoidoscopy):  Excessive amounts of blood in the stool  Significant tenderness or  worsening of abdominal pains  Swelling of the abdomen that is new, acute  Fever of 100F or higher   For urgent or emergent issues, a gastroenterologist can be reached at any hour by calling [phone]. Do not use MyChart messaging for urgent concerns.    DIET:  We do recommend a small meal at first, but then you may proceed to your regular diet.  Drink plenty of fluids but you should avoid alcoholic beverages for 24 hours.  ACTIVITY:  You should plan to take it easy for the rest of today and you should NOT DRIVE or use heavy machinery until tomorrow (because of the sedation medicines used during the test).    FOLLOW UP: Our staff will call the number listed on your records the next business day following your procedure.  We will call around 7:15- 8:00 am to check on you and address any questions or concerns that you may have regarding the information given to you following your procedure. If we do not reach you, we will leave a message.     If any biopsies were taken you will be contacted by phone or by letter within the next 1-3 weeks.  Please call us at [phone] if you have not heard about the biopsies in 3 weeks.    SIGNATURES/CONFIDENTIALITY: You and/or your care partner have signed paperwork which will be entered into your electronic medical record.  These signatures attest to the fact that that the information  above on your After Visit Summary has been reviewed and is understood.  Full responsibility of the confidentiality of this discharge information lies with you and/or your care-partner.

## 2022-03-27 ENCOUNTER — Telehealth: Payer: Self-pay

## 2022-03-27 NOTE — Telephone Encounter (Signed)
  Follow up Call-     03/26/2022    7:18 AM  Call back number  Post procedure Call Back phone  # [phone]  Permission to leave phone message Yes     Patient questions:  Do you have a fever, pain , or abdominal swelling? No. Pain Score  0 *  Have you tolerated food without any problems? Yes.    Have you been able to return to your normal activities? Yes.    Do you have any questions about your discharge instructions: Diet   No. Medications  No. Follow up visit  No.  Do you have questions or concerns about your Care? No.  Actions: * If pain score is 4 or above: No action needed, pain <4.

## 2022-03-28 DIAGNOSIS — M85852 Other specified disorders of bone density and structure, left thigh: Secondary | ICD-10-CM | POA: Diagnosis not present

## 2022-03-28 DIAGNOSIS — Z1231 Encounter for screening mammogram for malignant neoplasm of breast: Secondary | ICD-10-CM | POA: Diagnosis not present

## 2022-03-28 DIAGNOSIS — R92313 Mammographic fatty tissue density, bilateral breasts: Secondary | ICD-10-CM | POA: Diagnosis not present

## 2022-03-28 LAB — HM DEXA SCAN: HM Dexa Scan: NORMAL

## 2022-04-01 ENCOUNTER — Encounter: Payer: Self-pay | Admitting: Internal Medicine

## 2022-04-01 DIAGNOSIS — Z86010 Personal history of colonic polyps: Secondary | ICD-10-CM

## 2022-04-01 DIAGNOSIS — Z860101 Personal history of adenomatous and serrated colon polyps: Secondary | ICD-10-CM

## 2022-04-01 HISTORY — DX: Personal history of adenomatous and serrated colon polyps: Z86.0101

## 2022-04-03 ENCOUNTER — Other Ambulatory Visit: Payer: Self-pay | Admitting: Physician Assistant

## 2022-04-03 NOTE — Telephone Encounter (Signed)
Next Visit: 08/21/2022  Last Visit: 03/13/2022  Last Fill: 01/14/2022  DX: Rheumatoid arthritis involving multiple sites with positive rheumatoid factor   Current Dose per office note 03/13/2022: Humira 40 mg sq injection every 14 days   Labs: 03/13/2022 CBC and CMP are normal  TB Gold: 12/13/2021 Neg   Okay to refill Humira?

## 2022-04-09 DIAGNOSIS — N6311 Unspecified lump in the right breast, upper outer quadrant: Secondary | ICD-10-CM | POA: Diagnosis not present

## 2022-04-09 DIAGNOSIS — R928 Other abnormal and inconclusive findings on diagnostic imaging of breast: Secondary | ICD-10-CM | POA: Diagnosis not present

## 2022-04-09 DIAGNOSIS — R921 Mammographic calcification found on diagnostic imaging of breast: Secondary | ICD-10-CM | POA: Diagnosis not present

## 2022-04-25 DIAGNOSIS — R920 Mammographic microcalcification found on diagnostic imaging of breast: Secondary | ICD-10-CM | POA: Diagnosis not present

## 2022-04-25 DIAGNOSIS — N6001 Solitary cyst of right breast: Secondary | ICD-10-CM | POA: Diagnosis not present

## 2022-04-25 DIAGNOSIS — R921 Mammographic calcification found on diagnostic imaging of breast: Secondary | ICD-10-CM | POA: Diagnosis not present

## 2022-04-25 DIAGNOSIS — R928 Other abnormal and inconclusive findings on diagnostic imaging of breast: Secondary | ICD-10-CM | POA: Diagnosis not present

## 2022-04-25 DIAGNOSIS — N6311 Unspecified lump in the right breast, upper outer quadrant: Secondary | ICD-10-CM | POA: Diagnosis not present

## 2022-04-25 DIAGNOSIS — D241 Benign neoplasm of right breast: Secondary | ICD-10-CM | POA: Diagnosis not present

## 2022-04-25 DIAGNOSIS — N6011 Diffuse cystic mastopathy of right breast: Secondary | ICD-10-CM | POA: Diagnosis not present

## 2022-04-28 ENCOUNTER — Telehealth: Payer: Self-pay

## 2022-04-28 NOTE — Telephone Encounter (Signed)
Sherry nurse navigator with [facility] breast imaging called report on surgical pathology breast biopsy that was ordered on 04/25/22 by Dr Diona Browner per [name].  [name] said benign breast imaging. Pt needs to return in 6 months for  calcifications in rt breast. [name] said pt already has appt for 6 mths scheduled. This report is under lab tab 04/25/22 surgical pathology. And copy has been taken to [facility] since Dr Diona Browner is not in office today. [name] said this was an Pharmacist, hospital for Dr Diona Browner. Sending to Dr Diona Browner and [place] pool.

## 2022-04-29 NOTE — Telephone Encounter (Signed)
Noted  

## 2022-05-25 ENCOUNTER — Other Ambulatory Visit: Payer: Self-pay | Admitting: Physician Assistant

## 2022-05-26 NOTE — Telephone Encounter (Signed)
Next Visit: 08/21/2022   Last Visit: 03/13/2022   Last Fill: 03/10/2022  DX: Rheumatoid arthritis involving multiple sites with positive rheumatoid factor    Current Dose per office note 03/13/2022: methotrexate 2.5 mg 4 tablets every 7 days,   Labs: 03/13/2022 CBC and CMP are normal.   Okay to refill MTX?

## 2022-06-17 ENCOUNTER — Other Ambulatory Visit: Payer: Self-pay | Admitting: Family Medicine

## 2022-06-19 ENCOUNTER — Telehealth: Payer: Self-pay | Admitting: Pharmacist

## 2022-06-19 NOTE — Telephone Encounter (Signed)
Submitted a Prior Authorization renewal request to [facility] for HUMIRA via CoverMyMeds. Will update once we receive a response.  Key: Cira Servant, PharmD, MPH, BCPS, CPP Clinical Pharmacist (Rheumatology and Pulmonology)

## 2022-06-24 ENCOUNTER — Encounter: Payer: Self-pay | Admitting: Rheumatology

## 2022-06-24 NOTE — Telephone Encounter (Signed)
Received form from [facility] for Humira PA renewal. Completed form and faxed back to [facility] with clinicals  Case # [id number] Phone: [phone] Fax: [phone]  Knox Saliva, PharmD, MPH, BCPS, CPP Clinical Pharmacist (Rheumatology and Pulmonology)

## 2022-06-27 NOTE — Telephone Encounter (Signed)
Received notification from [facility] regarding a prior authorization for [place]. Authorization has been APPROVED from 06/19/22 to 06/19/23. Approval letter sent to scan center.  Patient can continue to fill through [facility]: [phone]  Authorization # [id number]  Patient notified via MyChart to reach back out to pharmacy to re-process claim  Knox Saliva, PharmD, MPH, BCPS, CPP Clinical Pharmacist (Rheumatology and Pulmonology)

## 2022-07-21 ENCOUNTER — Encounter: Payer: Self-pay | Admitting: *Deleted

## 2022-07-21 ENCOUNTER — Other Ambulatory Visit: Payer: Self-pay | Admitting: Physician Assistant

## 2022-07-21 NOTE — Telephone Encounter (Signed)
Last Fill: 04/03/2022  Labs: 03/13/2022 CBC and CMP are normal.   TB Gold: 12/13/2021 Neg    Next Visit: 08/21/2022  Last Visit: 03/13/2022  [id number] arthritis involving multiple sites with positive rheumatoid factor   Current Dose per office note 03/13/2022: Humira 40 mg sq injection every 14 days   Message sent to patient to advise she is due to update labs.   Okay to refill Humira?

## 2022-07-23 ENCOUNTER — Other Ambulatory Visit: Payer: Self-pay | Admitting: *Deleted

## 2022-07-23 DIAGNOSIS — Z79899 Other long term (current) drug therapy: Secondary | ICD-10-CM | POA: Diagnosis not present

## 2022-07-23 LAB — CBC WITH DIFFERENTIAL/PLATELET
Basophils Relative: 0.4 %
Eosinophils Relative: 2.2 %
Lymphs Abs: 1782 cells/uL (ref 850–3900)
MCH: 33.4 pg — ABNORMAL HIGH (ref 27.0–33.0)
MPV: 12.5 fL (ref 7.5–12.5)
Platelets: 172 10*3/uL (ref 140–400)
WBC: 5.5 10*3/uL (ref 3.8–10.8)

## 2022-07-24 LAB — CBC WITH DIFFERENTIAL/PLATELET
Absolute Monocytes: 517 cells/uL (ref 200–950)
Basophils Absolute: 22 cells/uL (ref 0–200)
Eosinophils Absolute: 121 cells/uL (ref 15–500)
HCT: 40.7 % (ref 35.0–45.0)
Hemoglobin: 14 g/dL (ref 11.7–15.5)
MCHC: 34.4 g/dL (ref 32.0–36.0)
MCV: 97.1 fL (ref 80.0–100.0)
Monocytes Relative: 9.4 %
Neutro Abs: 3058 cells/uL (ref 1500–7800)
Neutrophils Relative %: 55.6 %
RBC: 4.19 10*6/uL (ref 3.80–5.10)
RDW: 12.4 % (ref 11.0–15.0)
Total Lymphocyte: 32.4 %

## 2022-07-24 LAB — COMPLETE METABOLIC PANEL WITH GFR
AG Ratio: 1.7 (calc) (ref 1.0–2.5)
ALT: 16 U/L (ref 6–29)
AST: 16 U/L (ref 10–35)
Albumin: 4.2 g/dL (ref 3.6–5.1)
Alkaline phosphatase (APISO): 66 U/L (ref 37–153)
BUN: 18 mg/dL (ref 7–25)
CO2: 29 mmol/L (ref 20–32)
Calcium: 9.4 mg/dL (ref 8.6–10.4)
Chloride: 106 mmol/L (ref 98–110)
Creat: 0.9 mg/dL (ref 0.50–1.05)
Globulin: 2.5 g/dL (calc) (ref 1.9–3.7)
Glucose, Bld: 128 mg/dL — ABNORMAL HIGH (ref 65–99)
Potassium: 4.7 mmol/L (ref 3.5–5.3)
Sodium: 141 mmol/L (ref 135–146)
Total Bilirubin: 0.5 mg/dL (ref 0.2–1.2)
Total Protein: 6.7 g/dL (ref 6.1–8.1)
eGFR: 73 mL/min/{1.73_m2} (ref >=60)

## 2022-07-24 NOTE — Progress Notes (Signed)
CBC and CMP are stable.  Glucose is elevated probably not a fasting sample.

## 2022-08-08 NOTE — Progress Notes (Signed)
Office Visit Note  Patient: Amy Hart             Date of Birth: Dec 31, 1960           MRN: [id number]             PCP: Excell Seltzer, MD Referring: Excell Seltzer, MD Visit Date: 08/21/2022 Occupation: @GUAROCC @  Subjective:   Plantar fasciitis flare in left foot.   History of Present Illness: Amy Hart is a 62 y.o. female with seropositive rheumatoid arthritis and osteoarthritis.  She returns today after her last visit in December 2023.  She states she is having a flare of left foot plantar fasciitis.  She has been seeing Dr. Logan Bores.  She does not like to get cortisone injection as she had a bad experience in the past.  She is doing some exercises and stretches which are not been very helpful.  She bought some new shoes which are helping to some extent.  She denies having a flare of rheumatoid arthritis.  She continues to have some stiffness in her hands but no joint swelling.  She had good response to left CMC injection on March 13, 2022.  She has been taking Humira 40 mg subcu every 14 days and methotrexate 4 tablets p.o. weekly along with folic acid without any interruption.  She denies any side effects from the medications.    Activities of Daily Living:  Patient reports morning stiffness for 10 minutes.   Patient Reports nocturnal pain.  Difficulty dressing/grooming: Denies Difficulty climbing stairs: Denies Difficulty getting out of chair: Denies Difficulty using hands for taps, buttons, cutlery, and/or writing: Denies  Review of Systems  Constitutional:  Negative for fatigue.  HENT:  Negative for mouth sores and mouth dryness.   Eyes:  Negative for dryness.  Respiratory:  Negative for shortness of breath.   Cardiovascular:  Negative for chest pain and palpitations.  Gastrointestinal:  Negative for blood in stool, constipation and diarrhea.  Endocrine: Negative for increased urination.  Genitourinary:  Negative for involuntary urination.  Musculoskeletal:   Positive for joint pain, joint pain and morning stiffness. Negative for gait problem, joint swelling, myalgias, muscle weakness, muscle tenderness and myalgias.  Skin:  Negative for color change, rash, hair loss and sensitivity to sunlight.  Allergic/Immunologic: Negative for susceptible to infections.  Neurological:  Negative for dizziness and headaches.  Hematological:  Negative for swollen glands.  Psychiatric/Behavioral:  Negative for depressed mood and sleep disturbance. The patient is not nervous/anxious.     PMFS History:  Patient Active Problem List   Diagnosis Date Noted   Hx of adenomatous polyp of colon 04/01/2022   COVID-19 07/03/2021   Plantar fasciitis 03/14/2021   Class 1 obesity without serious comorbidity with body mass index (BMI) of 33.0 to 33.9 in adult 03/09/2020   Prediabetes 01/08/2017   Epistaxis, recurrent 12/05/2014   Menopausal syndrome 12/05/2014   Rheumatoid arthritis (HCC) 01/02/2012   Pure hypercholesterolemia 08/29/2009   Osteoarthritis, hands 08/24/2009   ROTATOR CUFF SYNDROME 08/04/2007   Hypothyroidism 05/19/2007   ALLERGIC RHINITIS, SEASONAL 05/19/2007    Past Medical History:  Diagnosis Date   Allergic rhinitis due to pollen    Arthritis    Collagen vascular disease (HCC)    COVID-19 06/2021   Hx of adenomatous polyp of colon 04/01/2022   Diminutive adenoma recall 2033   Plantar fasciitis of right foot    Rheumatoid arthritis(714.0)    Unspecified hypothyroidism     Family History  Problem Relation Age of Onset   Hypertension Mother    Thyroid disease Mother    Nephrolithiasis Mother    Diabetes Father    Kidney Stones Sister    Colon cancer Neg Hx    Colon polyps Neg Hx    Esophageal cancer Neg Hx    Rectal cancer Neg Hx    Stomach cancer Neg Hx    Past Surgical History:  Procedure Laterality Date   BREAST BIOPSY  04/07/2001   right, negative   COLONOSCOPY     ENDOMETRIAL ABLATION  04/08/2003   FRACTURE SURGERY  04/08/1999    right elbow   ROTATOR CUFF REPAIR  06/05/2008   R. Cuff tear   TAYLOR BUNIONECTOMY     right   TREATMENT FISTULA ANAL  04/08/1991   repair   Social History   Social History Narrative   No exercise   Diet: Moderate   She has a Social research officer, government   Lives with her partner   Immunization History  Administered Date(s) Administered   COVID-19, mRNA, vaccine(Comirnaty)12 years and older 12/31/2021   Influenza Inj Mdck Quad Pf 01/09/2018   Influenza,inj,Quad PF,6+ Mos 12/07/2015, 01/09/2018, 01/19/2019, 01/24/2020, 01/16/2021, 12/31/2021   Influenza-Unspecified 01/10/2015, 01/08/2017   PFIZER(Purple Top)SARS-COV-2 Vaccination 06/17/2019, 07/12/2019, 01/27/2020, 07/27/2020   Pfizer Covid-19 Vaccine Bivalent Booster 39yrs & up 01/25/2021   Pneumococcal Conjugate-13 12/07/2015   Pneumococcal Polysaccharide-23 01/08/2017   Td 02/05/1998, 07/13/2008   Tdap 03/08/2019   Zoster Recombinat (Shingrix) 03/08/2019, 05/10/2019   Zoster, Live 09/09/2013     Objective: Vital Signs: BP 123/76 (BP Location: Left Arm, Patient Position: Sitting, Cuff Size: Normal)   Pulse 68   Resp 14   Ht 5\' 4"  (1.626 m)   Wt 206 lb 6.4 oz (93.6 kg)   BMI 35.43 kg/m    Physical Exam Vitals and nursing note reviewed.  Constitutional:      Appearance: She is well-developed.  HENT:     Head: Normocephalic and atraumatic.  Eyes:     Conjunctiva/sclera: Conjunctivae normal.  Cardiovascular:     Rate and Rhythm: Normal rate and regular rhythm.     Heart sounds: Normal heart sounds.  Pulmonary:     Effort: Pulmonary effort is normal.     Breath sounds: Normal breath sounds.  Abdominal:     General: Bowel sounds are normal.     Palpations: Abdomen is soft.  Musculoskeletal:     Cervical back: Normal range of motion.  Lymphadenopathy:     Cervical: No cervical adenopathy.  Skin:    General: Skin is warm and dry.     Capillary Refill: Capillary refill takes less than 2 seconds.  Neurological:      Mental Status: She is alert and oriented to person, place, and time.  Psychiatric:        Behavior: Behavior normal.      Musculoskeletal Exam: Cervical spine was in good range of motion.  Shoulder joints, elbow joints, wrist joints, MCPs PIPs and DIPs were in good range of motion.  No synovitis was noted.  Bilateral PIP and DIP thickening was noted.  Hip joints and knee joints were in good range of motion without any warmth swelling or effusion.  There was no tenderness over ankles or MTPs.  She had tenderness over left plantar fascia.  CDAI Exam: CDAI Score: -- Patient Global: 2 mm; Provider Global: 2 mm Swollen: --; Tender: -- Joint Exam 08/21/2022   No joint exam has been documented for this  visit   There is currently no information documented on the homunculus. Go to the Rheumatology activity and complete the homunculus joint exam.  Investigation: No additional findings.  Imaging: No results found.  Recent Labs: Lab Results  Component Value Date   WBC 5.5 07/23/2022   HGB 14.0 07/23/2022   PLT 172 07/23/2022   NA 141 07/23/2022   K 4.7 07/23/2022   CL 106 07/23/2022   CO2 29 07/23/2022   GLUCOSE 128 (H) 07/23/2022   BUN 18 07/23/2022   CREATININE 0.90 07/23/2022   BILITOT 0.5 07/23/2022   ALKPHOS 66 03/07/2021   AST 16 07/23/2022   ALT 16 07/23/2022   PROT 6.7 07/23/2022   ALBUMIN 4.3 03/07/2021   CALCIUM 9.4 07/23/2022   GFRAA 89 09/12/2020   QFTBGOLDPLUS NEGATIVE 12/13/2021    Speciality Comments: No specialty comments available.  Procedures:  No procedures performed Allergies: Naproxen   Assessment / Plan:     Visit Diagnoses: Rheumatoid arthritis involving multiple sites with positive rheumatoid factor (HCC)-she denies having a flare of rheumatoid arthritis.  No synovitis was noted on the examination today.  She had good response to left CMC injection.  She has been taking Humira and methotrexate without any interruption.  She has not had x-rays of her  hands and feet in a long time.  Patient declined x-rays today.  High risk medication use - Humira 40 mg sq injection every 14 days, methotrexate 2.5 mg 4 tablets every 7 days, and folic acid 1 mg 1 tablet daily.  Labs obtained on July 23, 2022 CBC with differential and CMP with GFR were normal.  TB Gold was negative on December 13, 2021.  She was advised to get labs again in July and then every 3 months to monitor for drug toxicity.  She will have repeat TB Gold in September or October.  Information on immunization was placed in the AVS.  She was advised to hold Humira and methotrexate if she develops an infection resume after the infection resolves.  Annual skin examination to screen for skin cancer was advised.  Sun protection and sunscreen was advised.  Arthritis of carpometacarpal (CMC) joint of left thumb-she had good response to cortisone injection given at the last visit.  Primary osteoarthritis of both hands-she had bilateral PIP and DIP thickening.  Joint protection muscle strengthening was discussed.  Contracture of elbow joint, right-unchanged without any synovitis.  Plantar fasciitis of right foot -she has had recurrent Planter fasciitis in the past.  She is currently having a flare of Planter fasciitis.  She has been seeing Dr. Logan Bores.  She states she does not want to have a cortisone injection as she had a very bad experience in the past several years ago.  She has been using shoes with orthotics and doing stretches as advised by Dr. Logan Bores.  Probable ganglion cyst of right plantar foot diagnosed by Dr. Logan Bores.  History of hypothyroidism  History of kidney stones  Prediabetes  History of hematuria  Orders: No orders of the defined types were placed in this encounter.  No orders of the defined types were placed in this encounter.    Follow-Up Instructions: Return in about 5 months (around 01/21/2023) for Rheumatoid arthritis, Osteoarthritis.   Pollyann Savoy, MD  Note -  This record has been created using Animal nutritionist.  Chart creation errors have been sought, but may not always  have been located. Such creation errors do not reflect on  the standard of medical care.

## 2022-08-10 ENCOUNTER — Other Ambulatory Visit: Payer: Self-pay | Admitting: Rheumatology

## 2022-08-11 ENCOUNTER — Other Ambulatory Visit: Payer: Self-pay | Admitting: Physician Assistant

## 2022-08-11 NOTE — Telephone Encounter (Signed)
Last Fill: 05/26/2022  Labs: 07/23/2022 CBC and CMP are stable.  Glucose is elevated probably not a fasting sample   Next Visit: 08/21/2022  Last Visit: 03/14/2023  DX: Rheumatoid arthritis involving multiple sites with positive rheumatoid factor   Current Dose per office note 03/13/2022: methotrexate 2.5 mg 4 tablets every 7 days   Okay to refill Methotrexate?

## 2022-08-12 NOTE — Telephone Encounter (Signed)
Last Fill: 07/21/2022 (30 day supply)  Labs: 07/23/2022 CBC and CMP are stable.  Glucose is elevated probably not a fasting sample.   TB Gold: 12/14/2022 Neg    Next Visit: 08/21/2022  Last Visit: 03/13/2022  DX: Rheumatoid arthritis involving multiple sites with positive rheumatoid factor   Current Dose per office note 03/13/2022:  Humira 40 mg sq injection every 14 days   Okay to refill Humira?

## 2022-08-21 ENCOUNTER — Ambulatory Visit: Payer: BC Managed Care – PPO | Attending: Rheumatology | Admitting: Rheumatology

## 2022-08-21 ENCOUNTER — Encounter: Payer: Self-pay | Admitting: Rheumatology

## 2022-08-21 VITALS — BP 123/76 | HR 68 | Resp 14 | Ht 64.0 in | Wt 206.4 lb

## 2022-08-21 DIAGNOSIS — Z8639 Personal history of other endocrine, nutritional and metabolic disease: Secondary | ICD-10-CM

## 2022-08-21 DIAGNOSIS — M19041 Primary osteoarthritis, right hand: Secondary | ICD-10-CM

## 2022-08-21 DIAGNOSIS — M19042 Primary osteoarthritis, left hand: Secondary | ICD-10-CM

## 2022-08-21 DIAGNOSIS — R7303 Prediabetes: Secondary | ICD-10-CM

## 2022-08-21 DIAGNOSIS — M24521 Contracture, right elbow: Secondary | ICD-10-CM

## 2022-08-21 DIAGNOSIS — M1812 Unilateral primary osteoarthritis of first carpometacarpal joint, left hand: Secondary | ICD-10-CM | POA: Diagnosis not present

## 2022-08-21 DIAGNOSIS — M0579 Rheumatoid arthritis with rheumatoid factor of multiple sites without organ or systems involvement: Secondary | ICD-10-CM

## 2022-08-21 DIAGNOSIS — M722 Plantar fascial fibromatosis: Secondary | ICD-10-CM

## 2022-08-21 DIAGNOSIS — Z79899 Other long term (current) drug therapy: Secondary | ICD-10-CM | POA: Diagnosis not present

## 2022-08-21 DIAGNOSIS — Z87448 Personal history of other diseases of urinary system: Secondary | ICD-10-CM

## 2022-08-21 DIAGNOSIS — Z87442 Personal history of urinary calculi: Secondary | ICD-10-CM

## 2022-08-21 NOTE — Patient Instructions (Signed)
Standing Labs We placed an order today for your standing lab work.   Please have your standing labs drawn in July and every 3 months  Please have your labs drawn 2 weeks prior to your appointment so that the provider can discuss your lab results at your appointment, if possible.  Please note that you may see your imaging and lab results in MyChart before we have reviewed them. We will contact you once all results are reviewed. Please allow our office up to 72 hours to thoroughly review all of the results before contacting the office for clarification of your results.  WALK-IN LAB HOURS  Monday through Thursday from 8:00 am -12:30 pm and 1:00 pm-5:00 pm and Friday from 8:00 am-12:00 pm.  Patients with office visits requiring labs will be seen before walk-in labs.  You may encounter longer than normal wait times. Please allow additional time. Wait times may be shorter on  Monday and Thursday afternoons.  We do not book appointments for walk-in labs. We appreciate your patience and understanding with our staff.   Labs are drawn by [facility]. Please bring your co-pay at the time of your lab draw.  You may receive a bill from [facility] for your lab work.  Please note if you are on Hydroxychloroquine and and an order has been placed for a Hydroxychloroquine level,  you will need to have it drawn 4 hours or more after your last dose.  If you wish to have your labs drawn at another location, please call the office 24 hours in advance so we can fax the orders.  The office is located at [address], [address], [place], [place] [address]   If you have any questions regarding directions or hours of operation,  please call [phone].   As a reminder, please drink plenty of water prior to coming for your lab work. Thanks!   Vaccines You are taking a medication(s) that can suppress your immune system.  The following immunizations are recommended: Flu annually Covid-19  Td/Tdap (tetanus, diphtheria,  pertussis) every 10 years Pneumonia (Prevnar 15 then Pneumovax 23 at least 1 year apart.  Alternatively, can take Prevnar 20 without needing additional dose) Shingrix: 2 doses from 4 weeks to 6 months apart  Please check with your PCP to make sure you are up to date.   If you have signs or symptoms of an infection or start antibiotics: First, call your PCP for workup of your infection. Hold your medication through the infection, until you complete your antibiotics, and until symptoms resolve if you take the following: Injectable medication (Actemra, Benlysta, Cimzia, Cosentyx, Enbrel, Humira, Kevzara, Orencia, Remicade, Simponi, Stelara, Taltz, Tremfya) Methotrexate Leflunomide (Arava) Mycophenolate (Cellcept) Harriette Ohara, Olumiant, or Rinvoq  Please get an annual skin examination to screen for skin cancer while you are on Humira.  Please use sunscreen and sun protection.

## 2022-08-22 ENCOUNTER — Encounter: Payer: Self-pay | Admitting: Rheumatology

## 2022-08-22 NOTE — Telephone Encounter (Signed)
Yes, she can come in for x-rays.

## 2022-08-22 NOTE — Telephone Encounter (Signed)
I called patient 

## 2022-08-27 ENCOUNTER — Ambulatory Visit: Payer: BC Managed Care – PPO | Attending: Rheumatology

## 2022-08-27 ENCOUNTER — Ambulatory Visit (INDEPENDENT_AMBULATORY_CARE_PROVIDER_SITE_OTHER): Payer: BC Managed Care – PPO

## 2022-08-27 ENCOUNTER — Ambulatory Visit: Payer: BC Managed Care – PPO

## 2022-08-27 DIAGNOSIS — M722 Plantar fascial fibromatosis: Secondary | ICD-10-CM | POA: Diagnosis not present

## 2022-08-27 DIAGNOSIS — M79671 Pain in right foot: Secondary | ICD-10-CM

## 2022-08-27 DIAGNOSIS — M19042 Primary osteoarthritis, left hand: Secondary | ICD-10-CM

## 2022-08-27 DIAGNOSIS — M0579 Rheumatoid arthritis with rheumatoid factor of multiple sites without organ or systems involvement: Secondary | ICD-10-CM | POA: Diagnosis not present

## 2022-08-27 DIAGNOSIS — M19041 Primary osteoarthritis, right hand: Secondary | ICD-10-CM

## 2022-08-27 DIAGNOSIS — M79672 Pain in left foot: Secondary | ICD-10-CM

## 2022-08-27 NOTE — Addendum Note (Signed)
Addended by: Audrie Lia on: 08/27/2022 02:53 PM   Modules accepted: Orders

## 2022-09-24 ENCOUNTER — Telehealth: Payer: Self-pay

## 2022-09-24 DIAGNOSIS — Z79899 Other long term (current) drug therapy: Secondary | ICD-10-CM

## 2022-09-24 DIAGNOSIS — M0579 Rheumatoid arthritis with rheumatoid factor of multiple sites without organ or systems involvement: Secondary | ICD-10-CM

## 2022-09-24 NOTE — Telephone Encounter (Signed)
Submitted a Prior Authorization renewal request to  [employer]  for HUMIRA via Fax. Will update once we receive a response.  Fax: [phone] Phone: [phone]

## 2022-09-30 NOTE — Telephone Encounter (Signed)
Spoke with [employer], per rep, PA is under clinical review and should have a response some time this week.   Darolyn Rua, PharmD Student [employer]

## 2022-10-20 ENCOUNTER — Other Ambulatory Visit (HOSPITAL_COMMUNITY): Payer: Self-pay

## 2022-10-20 LAB — HM MAMMOGRAPHY

## 2022-10-20 NOTE — Telephone Encounter (Signed)
Patient will send [place] her new insurance information via MyChart.

## 2022-10-20 NOTE — Telephone Encounter (Signed)
Received fax from [employer] stating that any medication over the cost of $350 for a 1-35 day supply, $700 for a 30-60 day supply, or $1050 for a 60-90 day supply is restricted. Spoke with [employer], Maralyn Sago, that Humira is approved through medical necessity but does not officially mean a prior authorization is approved. Pharmacy would not be able to process at their level. There are preferred options that will be covered: adalimumab-adaz (Hyrimoz generic) and adalimumab-adbm (generic Cyltezo). Rep will refax letter.   Auth should already be in place and that there is no preferred specialty pharmacy. If any rejection at pharmacy level for biosimilars, [employer] will help place overrides.   Chesley Mires, PharmD, MPH, BCPS, CPP Clinical Pharmacist (Rheumatology and Pulmonology)

## 2022-10-22 ENCOUNTER — Other Ambulatory Visit: Payer: Self-pay | Admitting: *Deleted

## 2022-10-22 DIAGNOSIS — Z79899 Other long term (current) drug therapy: Secondary | ICD-10-CM

## 2022-10-23 LAB — COMPLETE METABOLIC PANEL WITH GFR
AG Ratio: 1.7 (calc) (ref 1.0–2.5)
ALT: 21 U/L (ref 6–29)
AST: 18 U/L (ref 10–35)
Albumin: 4.4 g/dL (ref 3.6–5.1)
Alkaline phosphatase (APISO): 76 U/L (ref 37–153)
BUN: 20 mg/dL (ref 7–25)
CO2: 29 mmol/L (ref 20–32)
Calcium: 9.7 mg/dL (ref 8.6–10.4)
Chloride: 104 mmol/L (ref 98–110)
Creat: 0.77 mg/dL (ref 0.50–1.05)
Globulin: 2.6 g/dL (calc) (ref 1.9–3.7)
Glucose, Bld: 102 mg/dL — ABNORMAL HIGH (ref 65–99)
Potassium: 5.1 mmol/L (ref 3.5–5.3)
Sodium: 139 mmol/L (ref 135–146)
Total Bilirubin: 0.5 mg/dL (ref 0.2–1.2)
Total Protein: 7 g/dL (ref 6.1–8.1)
eGFR: 88 mL/min/{1.73_m2} (ref >=60)

## 2022-10-23 LAB — CBC WITH DIFFERENTIAL/PLATELET
Absolute Monocytes: 538 cells/uL (ref 200–950)
Basophils Absolute: 50 cells/uL (ref 0–200)
Basophils Relative: 0.9 %
Eosinophils Absolute: 118 cells/uL (ref 15–500)
Eosinophils Relative: 2.1 %
HCT: 43.7 % (ref 35.0–45.0)
Hemoglobin: 14.7 g/dL (ref 11.7–15.5)
Lymphs Abs: 1770 cells/uL (ref 850–3900)
MCH: 33.3 pg — ABNORMAL HIGH (ref 27.0–33.0)
MCHC: 33.6 g/dL (ref 32.0–36.0)
MCV: 98.9 fL (ref 80.0–100.0)
MPV: 12.4 fL (ref 7.5–12.5)
Monocytes Relative: 9.6 %
Neutro Abs: 3125 cells/uL (ref 1500–7800)
Neutrophils Relative %: 55.8 %
Platelets: 184 10*3/uL (ref 140–400)
RBC: 4.42 10*6/uL (ref 3.80–5.10)
RDW: 13.1 % (ref 11.0–15.0)
Total Lymphocyte: 31.6 %
WBC: 5.6 10*3/uL (ref 3.8–10.8)

## 2022-10-23 NOTE — Progress Notes (Signed)
CBC and CMP are within normal limits.

## 2022-10-24 ENCOUNTER — Other Ambulatory Visit (HOSPITAL_COMMUNITY): Payer: Self-pay

## 2022-10-24 NOTE — Telephone Encounter (Signed)
Submitted a Prior Authorization request to  [employer]  for  ADALIMUMAB-ADAZ  via fax on CoverMyMeds. Will update once we receive a response.  Key: [id number]

## 2022-10-27 ENCOUNTER — Telehealth: Payer: Self-pay | Admitting: *Deleted

## 2022-10-27 NOTE — Telephone Encounter (Signed)
Amy Hart with [facility] called regarding with PA request for Humira.   Requested a call back at [phone] ext. [phone].

## 2022-10-28 MED ORDER — ADALIMUMAB-ADAZ 40 MG/0.4ML ~~LOC~~ SOAJ
40.0000 mg | SUBCUTANEOUS | 0 refills | Status: DC
Start: 2022-10-28 — End: 2022-12-30

## 2022-10-28 NOTE — Telephone Encounter (Signed)
Patient's insurance does not cover Humira. Her insurance will cover biosimilars  Chesley Mires, PharmD, MPH, BCPS, CPP Clinical Pharmacist (Rheumatology and Pulmonology)

## 2022-10-28 NOTE — Telephone Encounter (Signed)
Left VM for patient informing her that her insurance approved the generic medication of Hyrimoz, that we sent the Rx to [facility] and that she will need to call the insurance at [phone] to ask for an override of adalimumab-adaz if the pharmacy initially rejects the medication. We will send the copay card information to the patient via MyChart. If the patient calls [place] back, we will discuss the details below.  Received notification from patient's insurance [employer] that HUMIRA will no longer be preferred adalimumab product on formulary.  Patient's new insurance will prefer the following biosimilar(s): generic HYRIMOZ (Adalimumab-adaz) .  Discussed the switch to biosimilar with the patient. Reviewed that biosimilars are as clinically effective, as safe, and no more harmful than the originator product based on FDA studies. They are similar to generics but are different at molecular level and some require physician approval for the switch. Reviewed that biosimilars are FDA-approved and that studies are completed in patients who are clinically established on Humira and switched to biosimiliar. Discussed that dose and frequency are the same as for Humira.  Patient is in agreement to biosimilar switch from Humira to the generic version of HYRIMOZ (Adalimumab-adaz).  New start visit will not be needed for switch to generic version of HYRIMOZ (Adalimumab-adaz). However advised patient that if any issues with biosimilar injector device, patient should contact clinic and we can schedule training visit with pharmacy team  Advised patient that she may get a rejection from the pharmacy for this medication. If this occurs, she should call [phone] to ask her insurance to place an override in so that the medication goes through.  Prescription sent to [facility].  Rickey Primus, PharmD Candidate [employer] [phone]

## 2022-10-28 NOTE — Telephone Encounter (Signed)
Rx for adalimumab-adaz (generic Hyrimoz) sent to [facility]. If any rejection at pharmacy level for biosimilars, [employer] will help place overrides.  Patient will need copay card sent via MyChart for her ot provide to pharmacy.   Chesley Mires, PharmD, MPH, BCPS, CPP Clinical Pharmacist (Rheumatology and Pulmonology)

## 2022-10-30 NOTE — Telephone Encounter (Signed)
Called [facility] to see if prescription was going through insurance. Prescription was being rejected. Copay card information for generic of Hyrimoz was not on file, so I provided the copay card information and it has been added to the patient's chart.  [facility] Phone Number: [phone]

## 2022-10-30 NOTE — Telephone Encounter (Signed)
Called [employer] who said they will work on override and it should be completed by end of day today.

## 2022-11-04 ENCOUNTER — Other Ambulatory Visit: Payer: Self-pay | Admitting: Physician Assistant

## 2022-11-04 NOTE — Telephone Encounter (Signed)
Last Fill: 08/11/2022  Labs: 10/22/2022 CBC and CMP are within normal limits.   Next Visit: 01/22/2023  Last Visit: 08/21/2022  DX: Rheumatoid arthritis involving multiple sites with positive rheumatoid factor   Current Dose per office note 08/21/2022: methotrexate 2.5 mg 4 tablets every 7 days   Okay to refill Methotrexate?

## 2022-11-04 NOTE — Telephone Encounter (Signed)
Called [facility]. Was told the generic for Hyrimoz went through the patient's insurance and there is an order for it to be delivered to the patient tomorrow.

## 2022-12-02 ENCOUNTER — Ambulatory Visit (INDEPENDENT_AMBULATORY_CARE_PROVIDER_SITE_OTHER): Payer: PRIVATE HEALTH INSURANCE

## 2022-12-02 ENCOUNTER — Ambulatory Visit (INDEPENDENT_AMBULATORY_CARE_PROVIDER_SITE_OTHER): Payer: PRIVATE HEALTH INSURANCE | Admitting: Podiatry

## 2022-12-02 DIAGNOSIS — M722 Plantar fascial fibromatosis: Secondary | ICD-10-CM | POA: Diagnosis not present

## 2022-12-02 DIAGNOSIS — R52 Pain, unspecified: Secondary | ICD-10-CM | POA: Diagnosis not present

## 2022-12-02 MED ORDER — METHYLPREDNISOLONE 4 MG PO TBPK: ORAL_TABLET | ORAL | 0 refills | Status: DC

## 2022-12-02 MED ORDER — BETAMETHASONE SOD PHOS & ACET 6 (3-3) MG/ML IJ SUSP
3.0000 mg | Freq: Once | INTRAMUSCULAR | Status: AC
Start: 2022-12-02 — End: 2022-12-02
  Administered 2022-12-02: 3 mg via INTRA_ARTICULAR

## 2022-12-02 NOTE — Progress Notes (Signed)
   Chief Complaint  Patient presents with   Foot Pain    "I think there's a bone spur in my left heel." N - heel pain  L - plantar heel left D - February O - suddenly C - tender, sore A - walking barefoot T - physical therapist exercises    Subjective: 62 y.o. female presenting today for new complaint of onset of heel pain ongoing for the past 6 months.  Idiopathic onset beginning in February 2024.  She has been to physical therapy and tried different stretching exercises and icing daily.  She is on chronic long-term methotrexate from her rheumatologist.  She also her good supportive tennis shoes and does not go barefoot.  She presents for further treatment evaluation   Past Medical History:  Diagnosis Date   Allergic rhinitis due to pollen    Arthritis    Collagen vascular disease (HCC)    COVID-19 06/2021   Hx of adenomatous polyp of colon 04/01/2022   Diminutive adenoma recall 2033   Plantar fasciitis of right foot    Rheumatoid arthritis(714.0)    Unspecified hypothyroidism      Objective: Physical Exam General: The patient is alert and oriented x3 in no acute distress.  Dermatology: Skin is warm, dry and supple bilateral lower extremities. Negative for open lesions or macerations bilateral.   Vascular: Dorsalis Pedis and Posterior Tibial pulses palpable bilateral.  Capillary fill time is immediate to all digits.  Neurological: Grossly intact via light touch  Musculoskeletal: Tenderness to palpation to the plantar aspect of the left heel along the plantar fascia. All other joints range of motion within normal limits bilateral. Strength 5/5 in all groups bilateral.   Radiographic exam LT foot 12/02/2022: Normal osseous mineralization. Joint spaces preserved. No fracture/dislocation/boney destruction. No other soft tissue abnormalities or radiopaque foreign bodies.  Plantar heel spur noted on lateral view  Assessment: 1. Plantar fasciitis left foot with plantar heel  spur  Plan of Care:  -Patient evaluated. Xrays reviewed.   - Injection of 0.5cc Celestone soluspan injected into the left plantar fascia.  -Rx for Medrol Dose Pak placed -Patient currently on methotrexate from rheumatology.  Continue -Patient already wears good supportive shoes and sneakers.  Continue.  Advised against going barefoot.  She wears Oofos slides at the house -Instructed patient regarding therapies and modalities at home to alleviate symptoms.  -Return to clinic in 4 weeks.     Felecia Shelling, DPM [facility]  Dr. Felecia Shelling, DPM    [address] [place], [place] [address]                Office [phone]  Fax [phone]

## 2022-12-11 ENCOUNTER — Other Ambulatory Visit: Payer: Self-pay | Admitting: Physician Assistant

## 2022-12-11 NOTE — Telephone Encounter (Signed)
Last Fill: 06/17/2022  Next Visit: 01/22/2023  Last Visit: 08/21/2022  Dx: Rheumatoid arthritis involving multiple sites with positive rheumatoid factor   Current Dose per office note on 08/21/2022: folic acid 1 mg 1 tablet daily.   Okay to refill Folic Acid?

## 2022-12-29 ENCOUNTER — Other Ambulatory Visit: Payer: Self-pay | Admitting: Rheumatology

## 2022-12-29 DIAGNOSIS — Z79899 Other long term (current) drug therapy: Secondary | ICD-10-CM

## 2022-12-29 DIAGNOSIS — M0579 Rheumatoid arthritis with rheumatoid factor of multiple sites without organ or systems involvement: Secondary | ICD-10-CM

## 2022-12-30 NOTE — Telephone Encounter (Signed)
Last Fill: 10/28/2022  Labs: 10/22/2022 CBC and CMP are within normal limits.   TB Gold: 12/13/2021 Neg    Next Visit: 01/22/2023  Last Visit: 08/21/2022  [id number] arthritis involving multiple sites with positive rheumatoid factor   Current Dose per office note 08/21/2022: Humira 40 mg sq injection every 14 days   Patient to update labs and TB Gold at upcoming appointment.   Okay to refill Hyrimoz?

## 2023-01-06 ENCOUNTER — Encounter: Payer: Self-pay | Admitting: Podiatry

## 2023-01-06 ENCOUNTER — Ambulatory Visit (INDEPENDENT_AMBULATORY_CARE_PROVIDER_SITE_OTHER): Payer: PRIVATE HEALTH INSURANCE | Admitting: Podiatry

## 2023-01-06 VITALS — BP 119/73 | HR 59

## 2023-01-06 DIAGNOSIS — M722 Plantar fascial fibromatosis: Secondary | ICD-10-CM

## 2023-01-06 NOTE — Progress Notes (Signed)
   Chief Complaint  Patient presents with   Plantar Fasciitis    "Much better, I should have come here a long time ago."    Subjective: 62 y.o. female presenting today for follow-up evaluation of plantar fasciitis to the left heel.  Patient states that she is feeling significantly better.  She no longer has any pain or tenderness associated to the heel.  No new complaints  Past Medical History:  Diagnosis Date   Allergic rhinitis due to pollen    Arthritis    Collagen vascular disease (HCC)    COVID-19 06/2021   Hx of adenomatous polyp of colon 04/01/2022   Diminutive adenoma recall 2033   Plantar fasciitis of right foot    Rheumatoid arthritis(714.0)    Unspecified hypothyroidism      Objective: Physical Exam General: The patient is alert and oriented x3 in no acute distress.  Dermatology: Skin is warm, dry and supple bilateral lower extremities. Negative for open lesions or macerations bilateral.   Vascular: Dorsalis Pedis and Posterior Tibial pulses palpable bilateral.  Capillary fill time is immediate to all digits.  Neurological: Grossly intact via light touch  Musculoskeletal: Negative for any appreciable tenderness to palpation to the plantar aspect of the left heel along the plantar fascia. All other joints range of motion within normal limits bilateral. Strength 5/5 in all groups bilateral.   Radiographic exam LT foot 12/02/2022: Normal osseous mineralization. Joint spaces preserved. No fracture/dislocation/boney destruction. No other soft tissue abnormalities or radiopaque foreign bodies.  Plantar heel spur noted on lateral view  Assessment: 1. Plantar fasciitis left foot with plantar heel spur  Plan of Care:  -Patient evaluated.  -Patient currently on methotrexate from rheumatology.  Continue -Patient already wears good supportive shoes and sneakers.  Continue.  Advised against going barefoot.  She wears Oofos slides at the house -Instructed patient regarding  therapies and modalities at home to alleviate symptoms.  -Return to clinic as needed   Felecia Shelling, DPM [facility]  Dr. Felecia Shelling, DPM    [address] [place], [place] [address]                Office [phone]  Fax [phone]

## 2023-01-09 NOTE — Progress Notes (Unsigned)
Office Visit Note  Patient: Amy Hart             Date of Birth: Mar 13, 1961           MRN: [id number]             PCP: Excell Seltzer, MD Referring: Excell Seltzer, MD Visit Date: 01/22/2023 Occupation: @GUAROCC @  Subjective:  Medication monitoring  History of Present Illness: Amy Hart is a 62 y.o. female with history of seropositive rheumatoid arthritis and osteoarthritis.  Patient remains on  Humira 40 mg sq injection every 14 days, methotrexate 2.5 mg 4 tablets every 7 days, and folic acid 1 mg 1 tablet daily.  Amy Hart is tolerating combination therapy without any side effects and has not missed any doses recently. Denies any signs or symptoms of a rheumatoid arthritis flare.  Amy Hart has not been experiencing any morning stiffness, nocturnal pain, or difficulty with ADLs.  Amy Hart's symptoms of plantar fasciitis have resolved since having a cortisone injection performed and taking a steroid pack prescribed by Dr. Logan Bores.     Denies any recent or recurrent infections.  Patient is up-to-date with the COVID-vaccine as well as the annual flu shot.    Activities of Daily Living:  Patient reports morning stiffness for 0 minutes.   Patient Denies nocturnal pain.  Difficulty dressing/grooming: Denies Difficulty climbing stairs: Denies Difficulty getting out of chair: Denies Difficulty using hands for taps, buttons, cutlery, and/or writing: Denies  Review of Systems  Constitutional:  Negative for fatigue.  HENT:  Negative for mouth sores and mouth dryness.   Eyes:  Negative for dryness.  Respiratory:  Negative for shortness of breath.   Cardiovascular:  Negative for chest pain and palpitations.  Gastrointestinal:  Negative for blood in stool, constipation and diarrhea.  Endocrine: Negative for increased urination.  Genitourinary:  Negative for involuntary urination.  Musculoskeletal:  Negative for joint pain, gait problem, joint pain, joint swelling, myalgias, muscle weakness,  morning stiffness, muscle tenderness and myalgias.  Skin:  Negative for color change, rash and sensitivity to sunlight.  Allergic/Immunologic: Negative for susceptible to infections.  Neurological:  Negative for dizziness and headaches.  Hematological:  Negative for swollen glands.  Psychiatric/Behavioral:  Negative for depressed mood and sleep disturbance. The patient is not nervous/anxious.     PMFS History:  Patient Active Problem List   Diagnosis Date Noted   Hx of adenomatous polyp of colon 04/01/2022   COVID-19 07/03/2021   Plantar fasciitis 03/14/2021   Class 1 obesity without serious comorbidity with body mass index (BMI) of 33.0 to 33.9 in adult 03/09/2020   Prediabetes 01/08/2017   Epistaxis, recurrent 12/05/2014   Menopausal syndrome 12/05/2014   Rheumatoid arthritis (HCC) 01/02/2012   Pure hypercholesterolemia 08/29/2009   Osteoarthritis, hands 08/24/2009   Disorder of bursae and tendons in shoulder region 08/04/2007   Hypothyroidism 05/19/2007   ALLERGIC RHINITIS, SEASONAL 05/19/2007    Past Medical History:  Diagnosis Date   Allergic rhinitis due to pollen    Arthritis    Collagen vascular disease (HCC)    COVID-19 06/2021   Hx of adenomatous polyp of colon 04/01/2022   Diminutive adenoma recall 2033   Plantar fasciitis of right foot    Rheumatoid arthritis(714.0)    Unspecified hypothyroidism     Family History  Problem Relation Age of Onset   Hypertension Mother    Thyroid disease Mother    Nephrolithiasis Mother    Diabetes Father    Kidney Stones  Sister    Colon cancer Neg Hx    Colon polyps Neg Hx    Esophageal cancer Neg Hx    Rectal cancer Neg Hx    Stomach cancer Neg Hx    Past Surgical History:  Procedure Laterality Date   BREAST BIOPSY  04/07/2001   right, negative   COLONOSCOPY     ENDOMETRIAL ABLATION  04/08/2003   FRACTURE SURGERY  04/08/1999   right elbow   ROTATOR CUFF REPAIR  06/05/2008   R. Cuff tear   Kieffer Blatz BUNIONECTOMY      right   TREATMENT FISTULA ANAL  04/08/1991   repair   Social History   Social History Narrative   No exercise   Diet: Moderate   She has a Social research officer, government   Lives with her partner   Immunization History  Administered Date(s) Administered   Influenza Inj Mdck Quad Pf 01/09/2018   Influenza,inj,Quad PF,6+ Mos 12/07/2015, 01/09/2018, 01/19/2019, 01/24/2020, 01/16/2021, 12/31/2021   Influenza-Unspecified 01/10/2015, 01/08/2017   PFIZER(Purple Top)SARS-COV-2 Vaccination 06/17/2019, 07/12/2019, 01/27/2020, 07/27/2020   Pfizer Covid-19 Vaccine Bivalent Booster 13yrs & up 01/25/2021   Pfizer(Comirnaty)Fall Seasonal Vaccine 12 years and older 12/31/2021, 12/25/2022   Pneumococcal Conjugate-13 12/07/2015   Pneumococcal Polysaccharide-23 01/08/2017   Td 02/05/1998, 07/13/2008   Tdap 03/08/2019   Zoster Recombinant(Shingrix) 03/08/2019, 05/10/2019   Zoster, Live 09/09/2013     Objective: Vital Signs: BP 129/80 (BP Location: Left Arm, Patient Position: Sitting, Cuff Size: Normal)   Pulse 80   Resp 16   Ht 5\' 4"  (1.626 m)   Wt 208 lb 12.8 oz (94.7 kg)   BMI 35.84 kg/m    Physical Exam Vitals and nursing note reviewed.  Constitutional:      Appearance: She is well-developed.  HENT:     Head: Normocephalic and atraumatic.  Eyes:     Conjunctiva/sclera: Conjunctivae normal.  Cardiovascular:     Rate and Rhythm: Normal rate and regular rhythm.     Heart sounds: Normal heart sounds.  Pulmonary:     Effort: Pulmonary effort is normal.     Breath sounds: Normal breath sounds.  Abdominal:     General: Bowel sounds are normal.     Palpations: Abdomen is soft.  Musculoskeletal:     Cervical back: Normal range of motion.  Lymphadenopathy:     Cervical: No cervical adenopathy.  Skin:    General: Skin is warm and dry.     Capillary Refill: Capillary refill takes less than 2 seconds.  Neurological:     Mental Status: She is alert and oriented to person, place, and time.   Psychiatric:        Behavior: Behavior normal.      Musculoskeletal Exam: C-spine, thoracic spine, lumbar spine have good range of motion.  Shoulder joints have good range of motion.  Flexion contracture of right elbow.  Wrist joints, MCPs, PIPs, DIPs have good range of motion with no synovitis.  PIP and DIP thickening.  Thickening of the left CMC joint.  Hip joints have good range of motion no groin pain.  Knee joints have good range of motion no warmth or effusion.  Ankle joints have good range of motion with no tenderness or joint swelling.  CDAI Exam: CDAI Score: -- Patient Global: 0 / 100; Provider Global: 0 / 100 Swollen: --; Tender: -- Joint Exam 01/22/2023   No joint exam has been documented for this visit   There is currently no information documented on the homunculus. Go  to the Rheumatology activity and complete the homunculus joint exam.  Investigation: No additional findings.  Imaging: No results found.  Recent Labs: Lab Results  Component Value Date   WBC 5.6 10/22/2022   HGB 14.7 10/22/2022   PLT 184 10/22/2022   NA 139 10/22/2022   K 5.1 10/22/2022   CL 104 10/22/2022   CO2 29 10/22/2022   GLUCOSE 102 (H) 10/22/2022   BUN 20 10/22/2022   CREATININE 0.77 10/22/2022   BILITOT 0.5 10/22/2022   ALKPHOS 66 03/07/2021   AST 18 10/22/2022   ALT 21 10/22/2022   PROT 7.0 10/22/2022   ALBUMIN 4.3 03/07/2021   CALCIUM 9.7 10/22/2022   GFRAA 89 09/12/2020   QFTBGOLDPLUS NEGATIVE 12/13/2021    Speciality Comments: No specialty comments available.  Procedures:  No procedures performed Allergies: Naproxen  t.  Assessment / Plan:     Visit Diagnoses: Rheumatoid arthritis involving multiple sites with positive rheumatoid factor (HCC):  No joint tenderness or synovitis on examination today.  No signs or symptoms of a rheumatoid arthritis flare.  No morning stiffness, nocturnal pain, or difficulty with ADLs.  Sheniya's rheumatoid arthritis remains well-controlled on  adalimumab-adaz 40 mg sq injections every 14 days, methotrexate 4 tablets by mouth once weekly, and folic acid 1 mg daily.  Patient is tolerating combination therapy without any side effects. No interruptions in therapy.  No recent or recurrent infections.  No medication changes will be made at this time.  Patient was advised to notify us if there are signs or symptoms of a flare. Ivelis will follow-up in the office in 5 months or sooner if needed.  High risk medication use - Adalimumab-adaz 40 mg sq injection every 14 days, methotrexate 2.5 mg 4 tablets every 7 days, and folic acid 1 mg 1 tablet daily.  CBC and CMP updated on 10/22/22. Orders for CBC and CMP released today.   TB gold negative on 12/13/21. Order for TB gold released today.  No recent or recurrent infections.  Discussed the importance of holding humira and methotrexate if the patient develops signs or symptoms of an infection and to resume once the infection has completely cleared.  Up-to-date with COVID vaccine and annual flu vaccine.  - Plan: COMPLETE METABOLIC PANEL WITH GFR, CBC with Differential/Platelet, QuantiFERON-TB Gold Plus  Screening for tuberculosis -Order for TB gold released today.  Plan: QuantiFERON-TB Gold Plus  Arthritis of carpometacarpal (CMC) joint of left thumb: Thickening but no tenderness or inflammation noted today. Left CMC joint cortisone injection performed on 03/13/2022 which alleviated the symptoms.  Primary osteoarthritis of both hands: PIP and DIP thickening.  No active inflammation noted.    Contracture of elbow joint, right: Unchanged.  No tenderness or inflammation noted.  Plantar fasciitis of left foot - Dr. Sander Nephew.  Patient had a steroid pack and a cortisone injection-alleviated symptoms.  Wearing proper fitting shoes which have been helpful at preventing recurrence.  Other medical conditions are listed as follows:  History of hypothyroidism  History of kidney  stones  Prediabetes  History of hematuria    Orders: Orders Placed This Encounter  Procedures   COMPLETE METABOLIC PANEL WITH GFR   CBC with Differential/Platelet   QuantiFERON-TB Gold Plus   No orders of the defined types were placed in this encounter.   Follow-Up Instructions: Return in about 5 months (around 06/22/2023) for Rheumatoid arthritis.   Gearldine Bienenstock, PA-C  Note - This record has been created using Dragon software.  Chart creation errors  have been sought, but may not always  have been located. Such creation errors do not reflect on  the standard of medical care.

## 2023-01-16 ENCOUNTER — Telehealth: Payer: Self-pay | Admitting: Family Medicine

## 2023-01-16 MED ORDER — LEVOTHYROXINE SODIUM 100 MCG PO TABS: ORAL_TABLET | ORAL | 3 refills | Status: DC

## 2023-01-16 NOTE — Telephone Encounter (Signed)
Prescription sent  to [facility]

## 2023-01-16 NOTE — Telephone Encounter (Signed)
LAST APPOINTMENT DATE: 03/20/22   NEXT APPOINTMENT DATE: Visit date not found   Levothyroxine  LAST REFILL:03/24/22  QTY: #90 [id number]

## 2023-01-16 NOTE — Telephone Encounter (Signed)
Prescription Request  01/16/2023  LOV: 03/20/2022  What is the name of the medication or equipment? levothyroxine (SYNTHROID) 100 MCG tablet   Have you contacted your pharmacy to request a refill? No   Which pharmacy would you like this sent to?   [facility] [address] [place], [place] - [address] [facility] [address] [place] [address] Phone: [phone] Fax: [phone]    Patient notified that their request is being sent to the clinical staff for review and that they should receive a response within 2 business days.   Please advise at Mobile [phone] (mobile)  Patient is out of town,and has left this medication at home.She would like to know if a refill could be sent to this pharmacy.

## 2023-01-19 ENCOUNTER — Other Ambulatory Visit: Payer: Self-pay | Admitting: Rheumatology

## 2023-01-19 DIAGNOSIS — Z79899 Other long term (current) drug therapy: Secondary | ICD-10-CM

## 2023-01-19 DIAGNOSIS — M0579 Rheumatoid arthritis with rheumatoid factor of multiple sites without organ or systems involvement: Secondary | ICD-10-CM

## 2023-01-20 NOTE — Telephone Encounter (Signed)
Last Fill: 12/30/2022 (30 day supply)  Labs: 10/22/2022  TB Gold: 12/13/2021 Neg    Next Visit: 01/22/2023  Last Visit: 08/21/2022  DX: Rheumatoid arthritis involving multiple sites with positive rheumatoid factor   Current Dose per office note 08/21/2022: Humira 40 mg sq injection every 14 days   Patient to update labs at upcoming appointment on 01/22/2023  Okay to refill Humira?

## 2023-01-22 ENCOUNTER — Ambulatory Visit: Payer: PRIVATE HEALTH INSURANCE | Attending: Physician Assistant | Admitting: Physician Assistant

## 2023-01-22 ENCOUNTER — Encounter: Payer: Self-pay | Admitting: Physician Assistant

## 2023-01-22 VITALS — BP 129/80 | HR 80 | Resp 16 | Ht 64.0 in | Wt 208.8 lb

## 2023-01-22 DIAGNOSIS — Z79899 Other long term (current) drug therapy: Secondary | ICD-10-CM

## 2023-01-22 DIAGNOSIS — M19041 Primary osteoarthritis, right hand: Secondary | ICD-10-CM | POA: Diagnosis not present

## 2023-01-22 DIAGNOSIS — Z87448 Personal history of other diseases of urinary system: Secondary | ICD-10-CM

## 2023-01-22 DIAGNOSIS — Z111 Encounter for screening for respiratory tuberculosis: Secondary | ICD-10-CM

## 2023-01-22 DIAGNOSIS — M722 Plantar fascial fibromatosis: Secondary | ICD-10-CM

## 2023-01-22 DIAGNOSIS — M0579 Rheumatoid arthritis with rheumatoid factor of multiple sites without organ or systems involvement: Secondary | ICD-10-CM | POA: Diagnosis not present

## 2023-01-22 DIAGNOSIS — M1812 Unilateral primary osteoarthritis of first carpometacarpal joint, left hand: Secondary | ICD-10-CM

## 2023-01-22 DIAGNOSIS — Z87442 Personal history of urinary calculi: Secondary | ICD-10-CM

## 2023-01-22 DIAGNOSIS — M19042 Primary osteoarthritis, left hand: Secondary | ICD-10-CM

## 2023-01-22 DIAGNOSIS — M24521 Contracture, right elbow: Secondary | ICD-10-CM

## 2023-01-22 DIAGNOSIS — Z8639 Personal history of other endocrine, nutritional and metabolic disease: Secondary | ICD-10-CM

## 2023-01-22 DIAGNOSIS — R7303 Prediabetes: Secondary | ICD-10-CM

## 2023-01-22 NOTE — Patient Instructions (Signed)

## 2023-01-24 LAB — COMPLETE METABOLIC PANEL WITH GFR
AG Ratio: 1.6 (calc) (ref 1.0–2.5)
ALT: 16 U/L (ref 6–29)
AST: 14 U/L (ref 10–35)
Albumin: 4.1 g/dL (ref 3.6–5.1)
Alkaline phosphatase (APISO): 80 U/L (ref 37–153)
BUN: 19 mg/dL (ref 7–25)
CO2: 28 mmol/L (ref 20–32)
Calcium: 9.6 mg/dL (ref 8.6–10.4)
Chloride: 105 mmol/L (ref 98–110)
Creat: 0.78 mg/dL (ref 0.50–1.05)
Globulin: 2.6 g/dL (ref 1.9–3.7)
Glucose, Bld: 126 mg/dL — ABNORMAL HIGH (ref 65–99)
Potassium: 4.4 mmol/L (ref 3.5–5.3)
Sodium: 140 mmol/L (ref 135–146)
Total Bilirubin: 0.5 mg/dL (ref 0.2–1.2)
Total Protein: 6.7 g/dL (ref 6.1–8.1)
eGFR: 86 mL/min/{1.73_m2} (ref >=60)

## 2023-01-24 LAB — CBC WITH DIFFERENTIAL/PLATELET
Absolute Lymphocytes: 1536 {cells}/uL (ref 850–3900)
Absolute Monocytes: 408 {cells}/uL (ref 200–950)
Basophils Absolute: 29 {cells}/uL (ref 0–200)
Basophils Relative: 0.6 %
Eosinophils Absolute: 110 {cells}/uL (ref 15–500)
Eosinophils Relative: 2.3 %
HCT: 42.5 % (ref 35.0–45.0)
Hemoglobin: 14.5 g/dL (ref 11.7–15.5)
MCH: 33.8 pg — ABNORMAL HIGH (ref 27.0–33.0)
MCHC: 34.1 g/dL (ref 32.0–36.0)
MCV: 99.1 fL (ref 80.0–100.0)
MPV: 12.4 fL (ref 7.5–12.5)
Monocytes Relative: 8.5 %
Neutro Abs: 2717 {cells}/uL (ref 1500–7800)
Neutrophils Relative %: 56.6 %
Platelets: 165 10*3/uL (ref 140–400)
RBC: 4.29 10*6/uL (ref 3.80–5.10)
RDW: 12.7 % (ref 11.0–15.0)
Total Lymphocyte: 32 %
WBC: 4.8 10*3/uL (ref 3.8–10.8)

## 2023-01-24 LAB — QUANTIFERON-TB GOLD PLUS
Mitogen-NIL: >10 [IU]/mL
NIL: 0.03 [IU]/mL
QuantiFERON-TB Gold Plus: NEGATIVE
TB1-NIL: 0 [IU]/mL
TB2-NIL: 0 [IU]/mL

## 2023-01-26 ENCOUNTER — Other Ambulatory Visit: Payer: Self-pay | Admitting: Physician Assistant

## 2023-01-26 NOTE — Telephone Encounter (Signed)
Last Fill: 11/04/2022  Labs: 01/22/2023 Glucose is 126. Rest of CMP WNL. CBC WNL.   Next Visit: 07/09/2023  Last Visit: 01/22/2023  DX: Rheumatoid arthritis involving multiple sites with positive rheumatoid factor   Current Dose per office note 01/22/2023: methotrexate 2.5 mg 4 tablets every 7 days   Okay to refill Methotrexate?

## 2023-01-26 NOTE — Progress Notes (Signed)
Glucose is 126. Rest of CMP WNL. CBC WNL.  TB gold negative

## 2023-02-09 ENCOUNTER — Other Ambulatory Visit: Payer: Self-pay | Admitting: Rheumatology

## 2023-02-09 DIAGNOSIS — Z79899 Other long term (current) drug therapy: Secondary | ICD-10-CM

## 2023-02-09 DIAGNOSIS — M0579 Rheumatoid arthritis with rheumatoid factor of multiple sites without organ or systems involvement: Secondary | ICD-10-CM

## 2023-02-10 NOTE — Telephone Encounter (Signed)
Last Fill: 01/20/2023 (30 day supply)  Labs: 01/22/2023 Glucose is 126. Rest of CMP WNL. CBC WNL.   TB Gold: 01/22/2023 Neg    Next Visit: 07/09/2023  Last Visit: 01/22/2023  DX: Rheumatoid arthritis involving multiple sites with positive rheumatoid factor   Current Dose per office note 01/22/2023:  Adalimumab-adaz 40 mg sq injection every 14 days   Okay to refill Hyrimoz?

## 2023-02-17 ENCOUNTER — Telehealth (INDEPENDENT_AMBULATORY_CARE_PROVIDER_SITE_OTHER): Payer: PRIVATE HEALTH INSURANCE | Admitting: Family Medicine

## 2023-02-17 ENCOUNTER — Encounter: Payer: Self-pay | Admitting: Family Medicine

## 2023-02-17 VITALS — Temp 101.2°F | Ht 64.0 in | Wt 208.0 lb

## 2023-02-17 DIAGNOSIS — U071 COVID-19: Secondary | ICD-10-CM

## 2023-02-17 MED ORDER — NIRMATRELVIR/RITONAVIR (PAXLOVID)TABLET: 3.0000 | ORAL_TABLET | Freq: Two times a day (BID) | ORAL | 0 refills | Status: AC

## 2023-02-17 NOTE — Progress Notes (Signed)
Ph: [phone] Fax: [phone]   Patient ID: Amy Hart, female    DOB: [birth year], 61 y.o.   MRN: [id number]  Virtual visit completed through MyChart, a video enabled telemedicine application. Due to national recommendations of social distancing due to COVID-19, a virtual visit is felt to be most appropriate for this patient at this time. Reviewed limitations, risks, security and privacy concerns of performing a virtual visit and the availability of in person appointments. I also reviewed that there may be a patient responsible charge related to this service. The patient agreed to proceed.   Patient location: at work on her laptop Provider location: Adult nurse at [facility], office Persons participating in this virtual visit: patient, provider   If any vitals were documented, they were collected by patient at home unless specified below.    Temp (!) 101.2 F (38.4 C) (Temporal)   Ht 5\' 4"  (1.626 m)   Wt 208 lb (94.3 kg) Comment: per pt  BMI 35.70 kg/m    CC: COVID infection Subjective:   HPI: Amy Hart is a 62 y.o. female presenting on 02/17/2023 for Covid Exposure   First day of symptoms: 02/16/2023 Tested COVID positive: 02/16/2023  Current symptoms: rhinorrhea, "felt like hit by truck", dry cough, HA, muscle aches, nasal congestion No: ear or tooth pain, loss of taste/smell, abd pain, nausea, diarrhea. No dyspnea.  Treatments to date: OTC dayquil, nyquil combination  Risk factors include: RA on Humira and methotrexate, Obesity, age  COVID vaccination status: x at least 7, just got in 12/2022.   RA - last saw rheum 01/2023     Relevant past medical, surgical, family and social history reviewed and updated as indicated. Interim medical history since our last visit reviewed. Allergies and medications reviewed and updated. Outpatient Medications Prior to Visit  Medication Sig Dispense Refill   Adalimumab-adaz 40 MG/0.4ML SOAJ INJECT 1 PEN UNDER THE SKIN EVERY  14 DAYS 0.8 mL 2   Cholecalciferol (VITAMIN D-3) 1000 units CAPS Take 1 capsule by mouth daily.     folic acid (FOLVITE) 1 MG tablet TAKE 1 TABLET BY MOUTH EVERY DAY 90 tablet 3   levothyroxine (SYNTHROID) 100 MCG tablet TAKE 1 TABLET BY MOUTH EVERY DAY 90 tablet 3   methotrexate (RHEUMATREX) 2.5 MG tablet TAKE 4 TABLETS (10 MG TOTAL) BY MOUTH ONCE A WEEK 48 tablet 0   Multiple Vitamins-Minerals (MULTIVITAMIN ADULT PO) Take by mouth daily.     TURMERIC PO Take 1,000 mg by mouth daily.     vitamin C (ASCORBIC ACID) 500 MG tablet Take 500 mg by mouth daily.     methylPREDNISolone (MEDROL DOSEPAK) 4 MG TBPK tablet 6 day dose pack - take as directed (Patient not taking: Reported on 01/22/2023) 21 tablet 0   No facility-administered medications prior to visit.     Per HPI unless specifically indicated in ROS section below Review of Systems Objective:  Temp (!) 101.2 F (38.4 C) (Temporal)   Ht 5\' 4"  (1.626 m)   Wt 208 lb (94.3 kg) Comment: per pt  BMI 35.70 kg/m   Wt Readings from Last 3 Encounters:  02/17/23 208 lb (94.3 kg)  01/22/23 208 lb 12.8 oz (94.7 kg)  08/21/22 206 lb 6.4 oz (93.6 kg)       Physical exam: Gen: alert, NAD, not ill appearing Pulm: speaks in complete sentences without increased work of breathing Psych: normal mood, normal thought content      Lab Results  Component Value  Date   NA 140 01/22/2023   CL 105 01/22/2023   K 4.4 01/22/2023   CO2 28 01/22/2023   BUN 19 01/22/2023   CREATININE 0.78 01/22/2023   EGFR 86 01/22/2023   CALCIUM 9.6 01/22/2023   ALBUMIN 4.3 03/07/2021   GLUCOSE 126 (H) 01/22/2023    Assessment & Plan:   COVID-19 virus infection Assessment & Plan: Reviewed currently approved antiviral treatments.  Reviewed expected course of illness, anticipated course of recovery, as well as red flags to suggest COVID pneumonia and/or to seek urgent in-person care. Reviewed CDC isolation/quarantine guidelines.  Encouraged fluids and rest.  Reviewed further supportive care measures at home including vit C 500mg  bid, vit D 2000 IU daily, zinc 100mg  daily, tylenol PRN, pepcid 20mg  BID PRN.   Recommend:  Full dose paxlovid Paxlovid drug interactions:  None Rec hold Humira/methotrexate until feeling better.   [email or web address]    Other orders -     nirmatrelvir/ritonavir; Take 3 tablets by mouth 2 (two) times daily for 5 days. (Take nirmatrelvir 150 mg two tablets twice daily for 5 days and ritonavir 100 mg one tablet twice daily for 5 days) Patient GFR is 86  Dispense: 30 tablet; Refill: 0     I discussed the assessment and treatment plan with the patient. The patient was provided an opportunity to ask questions and all were answered. The patient agreed with the plan and demonstrated an understanding of the instructions. The patient was advised to call back or seek an in-person evaluation if the symptoms worsen or if the condition fails to improve as anticipated.  Follow up plan: No follow-ups on file.  Eustaquio Boyden, MD

## 2023-02-17 NOTE — Assessment & Plan Note (Signed)
Reviewed currently approved antiviral treatments.  Reviewed expected course of illness, anticipated course of recovery, as well as red flags to suggest COVID pneumonia and/or to seek urgent in-person care. Reviewed CDC isolation/quarantine guidelines.  Encouraged fluids and rest. Reviewed further supportive care measures at home including vit C 500mg  bid, vit D 2000 IU daily, zinc 100mg  daily, tylenol PRN, pepcid 20mg  BID PRN.   Recommend:  Full dose paxlovid Paxlovid drug interactions:  None Rec hold Humira/methotrexate until feeling better.   [email or web address]

## 2023-02-17 NOTE — Telephone Encounter (Signed)
Called patient scheduled with Dr. Sharen Hones today for virtual. No other actions needed at this time.

## 2023-03-13 ENCOUNTER — Telehealth: Payer: Self-pay | Admitting: *Deleted

## 2023-03-13 DIAGNOSIS — R7303 Prediabetes: Secondary | ICD-10-CM

## 2023-03-13 DIAGNOSIS — E7800 Pure hypercholesterolemia, unspecified: Secondary | ICD-10-CM

## 2023-03-13 DIAGNOSIS — E039 Hypothyroidism, unspecified: Secondary | ICD-10-CM

## 2023-03-13 NOTE — Telephone Encounter (Signed)
-----   Message from Alvina Chou sent at 03/13/2023  2:55 PM EST ----- Regarding: Lab orders for Amy Hart, 12.26.24 Patient is scheduled for CPX labs, please order future labs, Thanks , Camelia Eng

## 2023-04-02 ENCOUNTER — Other Ambulatory Visit (INDEPENDENT_AMBULATORY_CARE_PROVIDER_SITE_OTHER): Payer: PRIVATE HEALTH INSURANCE

## 2023-04-02 DIAGNOSIS — E7800 Pure hypercholesterolemia, unspecified: Secondary | ICD-10-CM | POA: Diagnosis not present

## 2023-04-02 DIAGNOSIS — E039 Hypothyroidism, unspecified: Secondary | ICD-10-CM

## 2023-04-02 DIAGNOSIS — R7303 Prediabetes: Secondary | ICD-10-CM | POA: Diagnosis not present

## 2023-04-02 LAB — COMPREHENSIVE METABOLIC PANEL
ALT: 15 U/L (ref 0–35)
AST: 16 U/L (ref 0–37)
Albumin: 4.2 g/dL (ref 3.5–5.2)
Alkaline Phosphatase: 72 U/L (ref 39–117)
BUN: 20 mg/dL (ref 6–23)
CO2: 29 meq/L (ref 19–32)
Calcium: 9.3 mg/dL (ref 8.4–10.5)
Chloride: 104 meq/L (ref 96–112)
Creatinine, Ser: 0.78 mg/dL (ref 0.40–1.20)
GFR: 81.66 mL/min (ref >60.00)
Glucose, Bld: 101 mg/dL — ABNORMAL HIGH (ref 70–99)
Potassium: 5.3 meq/L — ABNORMAL HIGH (ref 3.5–5.1)
Sodium: 140 meq/L (ref 135–145)
Total Bilirubin: 0.6 mg/dL (ref 0.2–1.2)
Total Protein: 6.8 g/dL (ref 6.0–8.3)

## 2023-04-02 LAB — LIPID PANEL
Cholesterol: 193 mg/dL (ref 0–200)
HDL: 62.7 mg/dL (ref >39.00)
LDL Cholesterol: 110 mg/dL — ABNORMAL HIGH (ref 0–99)
NonHDL: 130.67
Total CHOL/HDL Ratio: 3
Triglycerides: 105 mg/dL (ref 0.0–149.0)
VLDL: 21 mg/dL (ref 0.0–40.0)

## 2023-04-02 LAB — HEMOGLOBIN A1C: Hgb A1c MFr Bld: 5.6 % (ref 4.6–6.5)

## 2023-04-02 LAB — TSH: TSH: 2.31 u[IU]/mL (ref 0.35–5.50)

## 2023-04-02 LAB — T3, FREE: T3, Free: 3.3 pg/mL (ref 2.3–4.2)

## 2023-04-02 LAB — T4, FREE: Free T4: 0.81 ng/dL (ref 0.60–1.60)

## 2023-04-06 NOTE — Progress Notes (Signed)
No critical labs need to be addressed urgently. We will discuss labs in detail at upcoming office visit.   

## 2023-04-09 ENCOUNTER — Ambulatory Visit (INDEPENDENT_AMBULATORY_CARE_PROVIDER_SITE_OTHER): Payer: PRIVATE HEALTH INSURANCE | Admitting: Family Medicine

## 2023-04-09 ENCOUNTER — Other Ambulatory Visit: Payer: Self-pay | Admitting: *Deleted

## 2023-04-09 ENCOUNTER — Encounter: Payer: Self-pay | Admitting: Family Medicine

## 2023-04-09 VITALS — BP 136/72 | HR 78 | Temp 97.2°F | Ht 63.25 in | Wt 207.0 lb

## 2023-04-09 DIAGNOSIS — Z6833 Body mass index (BMI) 33.0-33.9, adult: Secondary | ICD-10-CM

## 2023-04-09 DIAGNOSIS — E7800 Pure hypercholesterolemia, unspecified: Secondary | ICD-10-CM | POA: Diagnosis not present

## 2023-04-09 DIAGNOSIS — E039 Hypothyroidism, unspecified: Secondary | ICD-10-CM

## 2023-04-09 DIAGNOSIS — E875 Hyperkalemia: Secondary | ICD-10-CM | POA: Insufficient documentation

## 2023-04-09 DIAGNOSIS — M0579 Rheumatoid arthritis with rheumatoid factor of multiple sites without organ or systems involvement: Secondary | ICD-10-CM | POA: Diagnosis not present

## 2023-04-09 DIAGNOSIS — M85859 Other specified disorders of bone density and structure, unspecified thigh: Secondary | ICD-10-CM

## 2023-04-09 DIAGNOSIS — M8580 Other specified disorders of bone density and structure, unspecified site: Secondary | ICD-10-CM | POA: Insufficient documentation

## 2023-04-09 DIAGNOSIS — R7303 Prediabetes: Secondary | ICD-10-CM

## 2023-04-09 DIAGNOSIS — E6609 Other obesity due to excess calories: Secondary | ICD-10-CM

## 2023-04-09 DIAGNOSIS — E66811 Obesity, class 1: Secondary | ICD-10-CM

## 2023-04-09 MED ORDER — TRAMADOL HCL 50 MG PO TABS: 50.0000 mg | ORAL_TABLET | Freq: Three times a day (TID) | ORAL | 0 refills | Status: AC | PRN

## 2023-04-09 MED ORDER — BENZONATATE 200 MG PO CAPS: 200.0000 mg | ORAL_CAPSULE | Freq: Two times a day (BID) | ORAL | 0 refills | Status: DC | PRN

## 2023-04-09 MED ORDER — METHOTREXATE SODIUM 2.5 MG PO TABS: 10.0000 mg | ORAL_TABLET | ORAL | 0 refills | Status: DC

## 2023-04-09 NOTE — Assessment & Plan Note (Signed)
Resolved with lifestyle change. 

## 2023-04-09 NOTE — Assessment & Plan Note (Signed)
 Recommend weight bearing exercise, calcium in diet and vit D supplement 400 IU 1-2 times daily.

## 2023-04-09 NOTE — Assessment & Plan Note (Signed)
Stable, chronic.  Continue current medication.  Levothyroxine 100 mcg daily

## 2023-04-09 NOTE — Assessment & Plan Note (Signed)
 LDL at goal less than 100 with diet control The 10-year ASCVD risk score (Arnett DK, et al., 2019) is: 3.7%   Values used to calculate the score:     Age: 63 years     Sex: Female     Is Non-Hispanic African American: No     Diabetic: No     Tobacco smoker: No     Systolic Blood Pressure: 136 mmHg     Is BP treated: No     HDL Cholesterol: 62.7 mg/dL     Total Cholesterol: 193 mg/dL

## 2023-04-09 NOTE — Assessment & Plan Note (Signed)
Chronic, followed by Dr. Cyndie Chime with our rheumatology. Treated and well-controlled with methotrexate and Humira.  Using tramadol 50 mg daily as needed #30 in 1 year

## 2023-04-09 NOTE — Telephone Encounter (Signed)
 Last Fill: 01/26/2023  Labs: 01/22/2023 Glucose is 126. Rest of CMP WNL. CBC WNL.   Next Visit: 07/09/2023  Last Visit: 01/22/2023  DX:  Rheumatoid arthritis involving multiple sites with positive rheumatoid factor   Current Dose per office note 01/22/2023: methotrexate  2.5 mg 4 tablets every 7 days   Okay to refill Methotrexate ?

## 2023-04-09 NOTE — Assessment & Plan Note (Signed)
 Encouraged exercise, weight loss, healthy eating habits. ? ?

## 2023-04-09 NOTE — Progress Notes (Signed)
 Patient ID: Amy Hart, female    DOB: Aug 19, 1960, 63 y.o.   MRN: [id number]  This visit was conducted in person.  BP 136/72   Pulse 78   Temp (!) 97.2 F (36.2 C) (Temporal)   Ht 5' 3.25 (1.607 m)   Wt 207 lb (93.9 kg)   SpO2 99%   BMI 36.38 kg/m    CC:  Chief Complaint  Patient presents with   Annual Exam    Subjective:   HPI: Amy Hart is a 63 y.o. female presenting on 04/09/2023 for Annual Exam  The patient presents for complete physical and review of chronic health problems. He/She also has the following acute concerns today:   RA flares more in winter, but tolerable She notes recurrent skin lesion on left wrist over [facility] [place] joint where she has had chiropractic treatment and past steroid injections.  Area changes from vascular appearing to hyperpigmentation off and on.   Had COVID in 11.2024.SABRA given Paxolvid.. feels well but persistent dry cough.  Wants to use tessalon  perles... trouble at night.  No SOB, No fever,  Elevated Cholesterol:  well controlled with diet Lab Results  Component Value Date   CHOL 193 04/02/2023   HDL 62.70 04/02/2023   LDLCALC 110 (H) 04/02/2023   LDLDIRECT 131.3 11/25/2011   TRIG 105.0 04/02/2023   CHOLHDL 3 04/02/2023  The 10-year ASCVD risk score (Arnett DK, et al., 2019) is: 3.7%   Values used to calculate the score:     Age: 27 years     Sex: Female     Is Non-Hispanic African American: No     Diabetic: No     Tobacco smoker: No     Systolic Blood Pressure: 136 mmHg     Is BP treated: No     HDL Cholesterol: 62.7 mg/dL     Total Cholesterol: 193 mg/dL Using medications without problems: Muscle aches:  Diet compliance: heart healthy diet Exercise: off and on Other complaints:  Prediabetes  resolved Lab Results  Component Value Date   HGBA1C 5.6 04/02/2023   Hypothyroidism At goal on levo 100 mcg Lab Results  Component Value Date   TSH 2.31 04/02/2023   RA:  On methotrexate , Humira . Followed by Dr, Dolphus  Rheum.   Using tramadol  50 mg as needed.. # 30 in 1 year.  Relevant past medical, surgical, family and social history reviewed and updated as indicated. Interim medical history since our last visit reviewed. Allergies and medications reviewed and updated. Outpatient Medications Prior to Visit  Medication Sig Dispense Refill   Adalimumab -adaz 40 MG/0.4ML SOAJ INJECT 1 PEN UNDER THE SKIN EVERY 14 DAYS 0.8 mL 2   Cholecalciferol (VITAMIN D-3) 1000 units CAPS Take 1 capsule by mouth daily.     folic acid  (FOLVITE ) 1 MG tablet TAKE 1 TABLET BY MOUTH EVERY DAY 90 tablet 3   levothyroxine  (SYNTHROID ) 100 MCG tablet TAKE 1 TABLET BY MOUTH EVERY DAY 90 tablet 3   methotrexate  (RHEUMATREX) 2.5 MG tablet Take 4 tablets (10 mg total) by mouth once a week. Caution:Chemotherapy. Protect from light. 48 tablet 0   Multiple Vitamins-Minerals (MULTIVITAMIN ADULT PO) Take by mouth daily.     TURMERIC PO Take 1,000 mg by mouth daily.     vitamin C (ASCORBIC ACID) 500 MG tablet Take 500 mg by mouth daily.     No facility-administered medications prior to visit.     Per HPI unless specifically indicated in ROS section below Review  of Systems  Constitutional:  Negative for fatigue and fever.  HENT:  Negative for congestion.   Eyes:  Negative for pain.  Respiratory:  Negative for cough and shortness of breath.   Cardiovascular:  Negative for chest pain, palpitations and leg swelling.  Gastrointestinal:  Negative for abdominal pain.  Genitourinary:  Negative for dysuria and vaginal bleeding.  Musculoskeletal:  Negative for back pain.  Neurological:  Negative for syncope, light-headedness and headaches.  Psychiatric/Behavioral:  Negative for dysphoric mood.    Objective:  BP 136/72   Pulse 78   Temp (!) 97.2 F (36.2 C) (Temporal)   Ht 5' 3.25 (1.607 m)   Wt 207 lb (93.9 kg)   SpO2 99%   BMI 36.38 kg/m   Wt Readings from Last 3 Encounters:  04/09/23 207 lb (93.9 kg)  02/17/23 208 lb (94.3 kg)   01/22/23 208 lb 12.8 oz (94.7 kg)      Physical Exam Vitals and nursing note reviewed.  Constitutional:      General: She is not in acute distress.    Appearance: Normal appearance. She is well-developed. She is obese. She is not ill-appearing or toxic-appearing.  HENT:     Head: Normocephalic.     Right Ear: Hearing, tympanic membrane, ear canal and external ear normal.     Left Ear: Hearing, tympanic membrane, ear canal and external ear normal.     Nose: Nose normal.  Eyes:     General: Lids are normal. Lids are everted, no foreign bodies appreciated.     Conjunctiva/sclera: Conjunctivae normal.     Pupils: Pupils are equal, round, and reactive to light.  Neck:     Thyroid : No thyroid  mass or thyromegaly.     Vascular: No carotid bruit.     Trachea: Trachea normal.  Cardiovascular:     Rate and Rhythm: Normal rate and regular rhythm.     Heart sounds: Normal heart sounds, S1 normal and S2 normal. No murmur heard.    No gallop.  Pulmonary:     Effort: Pulmonary effort is normal. No respiratory distress.     Breath sounds: Normal breath sounds. No wheezing, rhonchi or rales.  Abdominal:     General: Bowel sounds are normal. There is no distension or abdominal bruit.     Palpations: Abdomen is soft. There is no fluid wave or mass.     Tenderness: There is no abdominal tenderness. There is no guarding or rebound.     Hernia: No hernia is present.  Musculoskeletal:     Cervical back: Normal range of motion and neck supple.  Lymphadenopathy:     Cervical: No cervical adenopathy.  Skin:    General: Skin is warm and dry.     Findings: No rash.     Comments: Small hyperpigmented irregular lesion on left base of thumb at [facility] joint.  Neurological:     Mental Status: She is alert.     Cranial Nerves: No cranial nerve deficit.     Sensory: No sensory deficit.  Psychiatric:        Mood and Affect: Mood is not anxious or depressed.        Speech: Speech normal.        Behavior:  Behavior normal. Behavior is cooperative.        Judgment: Judgment normal.       Results for orders placed or performed in visit on 04/02/23  Hemoglobin A1c   Collection Time: 04/02/23  7:31 AM  Result Value Ref Range   Hgb A1c MFr Bld 5.6 4.6 - 6.5 %  TSH   Collection Time: 04/02/23  7:31 AM  Result Value Ref Range   TSH 2.31 0.35 - 5.50 uIU/mL  T4, free   Collection Time: 04/02/23  7:31 AM  Result Value Ref Range   Free T4 0.81 0.60 - 1.60 ng/dL  T3, free   Collection Time: 04/02/23  7:31 AM  Result Value Ref Range   T3, Free 3.3 2.3 - 4.2 pg/mL  Lipid panel   Collection Time: 04/02/23  7:31 AM  Result Value Ref Range   Cholesterol 193 0 - 200 mg/dL   Triglycerides 894.9 0.0 - 149.0 mg/dL   HDL 37.29 >60.99 mg/dL   VLDL 78.9 0.0 - 59.9 mg/dL   LDL Cholesterol 889 (H) 0 - 99 mg/dL   Total CHOL/HDL Ratio 3    NonHDL 130.67   Comprehensive metabolic panel   Collection Time: 04/02/23  7:31 AM  Result Value Ref Range   Sodium 140 135 - 145 mEq/L   Potassium 5.3 No hemolysis seen (H) 3.5 - 5.1 mEq/L   Chloride 104 96 - 112 mEq/L   CO2 29 19 - 32 mEq/L   Glucose, Bld 101 (H) 70 - 99 mg/dL   BUN 20 6 - 23 mg/dL   Creatinine, Ser 9.21 0.40 - 1.20 mg/dL   Total Bilirubin 0.6 0.2 - 1.2 mg/dL   Alkaline Phosphatase 72 39 - 117 U/L   AST 16 0 - 37 U/L   ALT 15 0 - 35 U/L   Total Protein 6.8 6.0 - 8.3 g/dL   Albumin 4.2 3.5 - 5.2 g/dL   GFR 18.33 >39.99 mL/min   Calcium 9.3 8.4 - 10.5 mg/dL     COVID 19 screen:  No recent travel or known exposure to COVID19 The patient denies respiratory symptoms of COVID 19 at this time. The importance of social distancing was discussed today.   Assessment and Plan The patient's preventative maintenance and recommended screening tests for an annual wellness exam were reviewed in full today. Brought up to date unless services declined.  Counselled on the importance of diet, exercise, and its role in overall health and mortality. The  patient's FH and SH was reviewed, including their home life, tobacco status, and drug and alcohol status.   Vaccines: flu, PCV 13 uptodate, PCV23 2018 and PCV 23 in 5 years, S/P COVID x 3  Nonsmoker.   PAP/DVE: Q 5year, last done 03/2019 nml , no HPV , DVE not indicated.. Asymptomatic.  Mammo: 03/2021, q2 years no family history for breast cancer. DUE Colon: 03/2022 Dr. Avram, nml repeat in 10 years.  STD screening: refused  Hep C : done   DEXA:  Osteopenia T -1.5 03/2022   Problem List Items Addressed This Visit     Class 1 obesity without serious comorbidity with body mass index (BMI) of 33.0 to 33.9 in adult   Encouraged exercise, weight loss, healthy eating habits.       Hyperkalemia   Acute,mild,  possibly secondary to multivitamin and Mrs. Dash.  She will pull back on this and labs will be rechecked by rheumatology within the next month.      Hypothyroidism - Primary   Stable, chronic.  Continue current medication.  Levothyroxine  100 mcg daily      Osteopenia   Recommend weight bearing exercise, calcium in diet and vit D supplement 400 IU 1-2 times daily.  Prediabetes   Resolved with lifestyle change.      Pure hypercholesterolemia   LDL at goal less than 100 with diet control The 10-year ASCVD risk score (Arnett DK, et al., 2019) is: 3.7%   Values used to calculate the score:     Age: 26 years     Sex: Female     Is Non-Hispanic African American: No     Diabetic: No     Tobacco smoker: No     Systolic Blood Pressure: 136 mmHg     Is BP treated: No     HDL Cholesterol: 62.7 mg/dL     Total Cholesterol: 193 mg/dL       Rheumatoid arthritis (HCC)   Chronic, followed by Dr. Reita with our rheumatology. Treated and well-controlled with methotrexate  and Humira .  Using tramadol  50 mg daily as needed #30 in 1 year      Relevant Medications   traMADol  (ULTRAM ) 50 MG tablet        Greig Ring, MD

## 2023-04-09 NOTE — Assessment & Plan Note (Signed)
 Acute,mild,  possibly secondary to multivitamin and Mrs. Dash.  She will pull back on this and labs will be rechecked by rheumatology within the next month.

## 2023-04-09 NOTE — Patient Instructions (Addendum)
 Stop multi vitamin and Mrs. Dash.. recheck potassium with labs  with rheumatologist.  Get back on track with low cholesterol diet.  Continue regular exercise.  Please call the location of your choice from the menu below to schedule your Mammogram and/or Bone Density appointment.    [facility]   [facility]                      Phone:  [phone] [address]                               [place], [place] [address]                                                             Services: Traditional and 3D Mammogram, Bone Density   [facility]                 Phone: [phone] [address]                                                       [place], [place] [address]    Service: Bone Density ONLY   *this site does NOT perform mammograms  [facility]                        Phone:  [phone] [address]                                  [place], [place] [address]                                            Services:  3D Mammogram and Bone Density    [place] [facility]   Phone:  [phone]   [address]                                                                            [place], [place] [address]                                            Services: 3D Mammogram and Bone [facility]  Phone:  [phone]   [address]  [place], [place] [address]                                              Services:  3D Mammogram and Bone Density

## 2023-05-04 ENCOUNTER — Other Ambulatory Visit: Payer: Self-pay | Admitting: *Deleted

## 2023-05-04 DIAGNOSIS — Z79899 Other long term (current) drug therapy: Secondary | ICD-10-CM

## 2023-05-05 LAB — CBC WITH DIFFERENTIAL/PLATELET
Absolute Lymphocytes: 1522 {cells}/uL (ref 850–3900)
Absolute Monocytes: 409 {cells}/uL (ref 200–950)
Basophils Absolute: 31 {cells}/uL (ref 0–200)
Basophils Relative: 0.7 %
Eosinophils Absolute: 158 {cells}/uL (ref 15–500)
Eosinophils Relative: 3.6 %
HCT: 43 % (ref 35.0–45.0)
Hemoglobin: 14.6 g/dL (ref 11.7–15.5)
MCH: 32.6 pg (ref 27.0–33.0)
MCHC: 34 g/dL (ref 32.0–36.0)
MCV: 96 fL (ref 80.0–100.0)
MPV: 13.1 fL — ABNORMAL HIGH (ref 7.5–12.5)
Monocytes Relative: 9.3 %
Neutro Abs: 2279 {cells}/uL (ref 1500–7800)
Neutrophils Relative %: 51.8 %
Platelets: 164 10*3/uL (ref 140–400)
RBC: 4.48 10*6/uL (ref 3.80–5.10)
RDW: 12.6 % (ref 11.0–15.0)
Total Lymphocyte: 34.6 %
WBC: 4.4 10*3/uL (ref 3.8–10.8)

## 2023-05-05 LAB — COMPLETE METABOLIC PANEL WITH GFR
AG Ratio: 1.7 (calc) (ref 1.0–2.5)
ALT: 21 U/L (ref 6–29)
AST: 20 U/L (ref 10–35)
Albumin: 4.3 g/dL (ref 3.6–5.1)
Alkaline phosphatase (APISO): 69 U/L (ref 37–153)
BUN: 15 mg/dL (ref 7–25)
CO2: 27 mmol/L (ref 20–32)
Calcium: 9.5 mg/dL (ref 8.6–10.4)
Chloride: 105 mmol/L (ref 98–110)
Creat: 0.81 mg/dL (ref 0.50–1.05)
Globulin: 2.6 g/dL (ref 1.9–3.7)
Glucose, Bld: 94 mg/dL (ref 65–99)
Potassium: 4.8 mmol/L (ref 3.5–5.3)
Sodium: 139 mmol/L (ref 135–146)
Total Bilirubin: 0.6 mg/dL (ref 0.2–1.2)
Total Protein: 6.9 g/dL (ref 6.1–8.1)
eGFR: 82 mL/min/{1.73_m2} (ref >=60)

## 2023-05-05 NOTE — Progress Notes (Signed)
CBC and CMP are stable.

## 2023-05-12 ENCOUNTER — Other Ambulatory Visit: Payer: Self-pay | Admitting: Physician Assistant

## 2023-05-12 DIAGNOSIS — Z79899 Other long term (current) drug therapy: Secondary | ICD-10-CM

## 2023-05-12 DIAGNOSIS — M0579 Rheumatoid arthritis with rheumatoid factor of multiple sites without organ or systems involvement: Secondary | ICD-10-CM

## 2023-05-12 NOTE — Telephone Encounter (Signed)
 Last Fill: 02/10/2023  Labs: 05/04/2023 CBC and CMP are stable.   TB Gold: 01/22/2023   Next Visit: 07/09/2023  Last Visit: 01/22/2023  IK:Myzlfjunpi arthritis involving multiple sites with positive rheumatoid factor   Current Dose per office note 01/22/2023: Adalimumab -adaz 40 mg sq injection every 14 days   Okay to refill Adalimumab -adaz?

## 2023-05-15 ENCOUNTER — Encounter: Payer: Self-pay | Admitting: Family Medicine

## 2023-05-16 ENCOUNTER — Encounter: Payer: Self-pay | Admitting: Family Medicine

## 2023-07-09 ENCOUNTER — Ambulatory Visit: Payer: PRIVATE HEALTH INSURANCE | Admitting: Rheumatology

## 2023-07-17 NOTE — Progress Notes (Signed)
 Office Visit Note  Patient: Amy Hart             Date of Birth: 05/22/1960           MRN: [id number]             PCP: Judithann Novas, MD Referring: Judithann Novas, MD Visit Date: 07/31/2023 Occupation: @GUAROCC @  Subjective:  Medication management  History of Present Illness: Amy Hart is a 63 y.o. female with seropositive rheumatoid arthritis and osteoarthritis.  She returns today after her last visit in October 2024.  Patient states she has not had a flare of rheumatoid arthritis since the last visit.  Although she had couple of days when she felt more stiff and then the symptoms resolved.  She is on Humira  40 mg subcu every 14 days and methotrexate  2.5 mg tablet, 4 tablets p.o. every week and folic acid  1 mg p.o. daily.  She had no interruption in the treatment.  She noticed a spot on her left hand which she would like to be evaluated.    Activities of Daily Living:  Patient reports morning stiffness for 10-15 minutes.   Patient Denies nocturnal pain.  Difficulty dressing/grooming: Denies Difficulty climbing stairs: Denies Difficulty getting out of chair: Denies Difficulty using hands for taps, buttons, cutlery, and/or writing: Denies  Review of Systems  Constitutional:  Negative for fatigue.  HENT:  Negative for mouth sores and mouth dryness.   Eyes:  Negative for dryness.  Respiratory:  Negative for shortness of breath.   Cardiovascular:  Negative for chest pain and palpitations.  Gastrointestinal:  Negative for blood in stool, constipation and diarrhea.  Endocrine: Negative for increased urination.  Genitourinary:  Negative for involuntary urination.  Musculoskeletal:  Positive for morning stiffness. Negative for joint pain, gait problem, joint pain, joint swelling, myalgias, muscle weakness, muscle tenderness and myalgias.  Skin:  Positive for color change. Negative for rash, hair loss and sensitivity to sunlight.  Allergic/Immunologic: Negative for susceptible  to infections.  Neurological:  Negative for dizziness and headaches.  Hematological:  Negative for swollen glands.  Psychiatric/Behavioral:  Negative for depressed mood and sleep disturbance. The patient is not nervous/anxious.     PMFS History:  Patient Active Problem List   Diagnosis Date Noted   Osteopenia 04/09/2023   Hyperkalemia 04/09/2023   Hx of adenomatous polyp of colon 04/01/2022   Class 1 obesity without serious comorbidity with body mass index (BMI) of 33.0 to 33.9 in adult 03/09/2020   Prediabetes 01/08/2017   Epistaxis, recurrent 12/05/2014   Menopausal syndrome 12/05/2014   Rheumatoid arthritis (HCC) 01/02/2012   Pure hypercholesterolemia 08/29/2009   Osteoarthritis, hands 08/24/2009   Disorder of bursae and tendons in shoulder region 08/04/2007   Hypothyroidism 05/19/2007   ALLERGIC RHINITIS, SEASONAL 05/19/2007    Past Medical History:  Diagnosis Date   Allergic rhinitis due to pollen    Arthritis    Collagen vascular disease (HCC)    COVID-19 06/2021   Hx of adenomatous polyp of colon 04/01/2022   Diminutive adenoma recall 2033   Plantar fasciitis of right foot    Rheumatoid arthritis(714.0)    Unspecified hypothyroidism     Family History  Problem Relation Age of Onset   Hypertension Mother    Thyroid  disease Mother    Nephrolithiasis Mother    Diabetes Father    Kidney Stones Sister    Colon cancer Neg Hx    Colon polyps Neg Hx    Esophageal  cancer Neg Hx    Rectal cancer Neg Hx    Stomach cancer Neg Hx    Past Surgical History:  Procedure Laterality Date   BREAST BIOPSY  04/07/2001   right, negative   COLONOSCOPY     ENDOMETRIAL ABLATION  04/08/2003   FRACTURE SURGERY  04/08/1999   right elbow   ROTATOR CUFF REPAIR  06/05/2008   R. Cuff tear   TAYLOR BUNIONECTOMY     right   TREATMENT FISTULA ANAL  04/08/1991   repair   Social History   Social History Narrative   No exercise   Diet: Moderate   She has a Social research officer, government    Lives with her partner   Immunization History  Administered Date(s) Administered   Influenza Inj Mdck Quad Pf 01/09/2018   Influenza, Mdck, Trivalent,PF 6+ MOS(egg free) 12/25/2022   Influenza,inj,Quad PF,6+ Mos 12/07/2015, 01/09/2018, 01/19/2019, 01/24/2020, 01/16/2021, 12/31/2021   Influenza-Unspecified 01/10/2015, 01/08/2017   PFIZER(Purple Top)SARS-COV-2 Vaccination 06/17/2019, 07/12/2019, 01/27/2020, 07/27/2020   Pfizer Covid-19 Vaccine Bivalent Booster 48yrs & up 01/25/2021   Pfizer(Comirnaty)Fall Seasonal Vaccine 12 years and older 12/31/2021, 12/25/2022   Pneumococcal Conjugate-13 12/07/2015   Pneumococcal Polysaccharide-23 01/08/2017   Respiratory Syncytial Virus Vaccine,Recomb Aduvanted(Arexvy) 05/16/2022   Td 02/05/1998, 07/13/2008   Tdap 03/08/2019   Zoster Recombinant(Shingrix) 03/08/2019, 05/10/2019   Zoster, Live 09/09/2013     Objective: Vital Signs: BP 129/83 (BP Location: Left Arm, Patient Position: Sitting, Cuff Size: Normal)   Pulse 73   Resp 16   Ht 5\' 4"  (1.626 m)   Wt 205 lb (93 kg)   BMI 35.19 kg/m    Physical Exam Vitals and nursing note reviewed.  Constitutional:      Appearance: She is well-developed.  HENT:     Head: Normocephalic and atraumatic.  Eyes:     Conjunctiva/sclera: Conjunctivae normal.  Cardiovascular:     Rate and Rhythm: Normal rate and regular rhythm.     Heart sounds: Normal heart sounds.  Pulmonary:     Effort: Pulmonary effort is normal.     Breath sounds: Normal breath sounds.  Abdominal:     General: Bowel sounds are normal.     Palpations: Abdomen is soft.  Musculoskeletal:     Cervical back: Normal range of motion.  Lymphadenopathy:     Cervical: No cervical adenopathy.  Skin:    General: Skin is warm and dry.     Capillary Refill: Capillary refill takes less than 2 seconds.  Neurological:     Mental Status: She is alert and oriented to person, place, and time.  Psychiatric:        Behavior: Behavior normal.       Musculoskeletal Exam: Cervical, thoracic and lumbar spine with good range of motion.  Shoulder joints were in good range of motion.  She had right elbow flexion contracture without any synovitis.  Left elbow joint was in full range of motion.  Wrist joints with good range of motion.  She has synovial thickening with no synovitis over MCP joints as described below.  PIP and DIP thickening was noted.  CMC prominence was noted.  Hip joints and knee joints were in good range of motion without any warmth swelling or effusion.  There was no tenderness over ankles or MTPs.  CDAI Exam: CDAI Score: 4  Patient Global: 10 / 100; Provider Global: 10 / 100 Swollen: 2 ; Tender: 0  Joint Exam 07/31/2023      Right  Left  MCP 2  Swollen      MCP 3     Swollen      Investigation: No additional findings.  Imaging: No results found.  Recent Labs: Lab Results  Component Value Date   WBC 4.4 05/04/2023   HGB 14.6 05/04/2023   PLT 164 05/04/2023   NA 139 05/04/2023   K 4.8 05/04/2023   CL 105 05/04/2023   CO2 27 05/04/2023   GLUCOSE 94 05/04/2023   BUN 15 05/04/2023   CREATININE 0.81 05/04/2023   BILITOT 0.6 05/04/2023   ALKPHOS 72 04/02/2023   AST 20 05/04/2023   ALT 21 05/04/2023   PROT 6.9 05/04/2023   ALBUMIN 4.2 04/02/2023   CALCIUM 9.5 05/04/2023   GFRAA 89 09/12/2020   QFTBGOLDPLUS NEGATIVE 01/22/2023    Speciality Comments: No specialty comments available.  Procedures:  No procedures performed Allergies: Naproxen   Assessment / Plan:     Visit Diagnoses: Rheumatoid arthritis involving multiple sites with positive rheumatoid factor (HCC)-patient denies having a flare of rheumatoid arthritis since the last visit.  Although she has had couple of days with increased stiffness.  She states she does better during the winter months.  She denies any interruption in her treatment since the last visit.  High risk medication use - Adalimumab -adaz 40 mg sq injection every 14 days,  methotrexate  2.5 mg 4 tablets every 7 days, and folic acid  1 mg 1 tablet daily. - Plan: CBC with Differential/Platelet, Comprehensive metabolic panel with GFR today.  She was advised to get labs every 3 months.  TB Gold was negative in July 2024.  Annual TB Gold was advised.  Information or immunization was placed in the AVS.  She was advised to hold Adalimumab -adaz and methotrexate  if she develops an infection resume of the infection resolves.  Annual skin examination was advised to screen for skin cancer while she is on Adalimumab -adaz.  Use of sunscreen and sun protection was discussed.  Arthritis of carpometacarpal (CMC) joint of left thumb -she has intermittent discomfort in the left CMC joint.  Left CMC joint cortisone injection performed on 03/13/2022 which alleviated the symptoms.  Primary osteoarthritis of both hands-bilateral MCP thickening with no synovitis was noted.  She has overlap of rheumatoid arthritis and osteoarthritis.  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  Plantar fasciitis of left foot - Dr. York Henri.  Patient had a steroid pack and a cortisone injection-  Rash-patient gives history of intermittent rash.  She showed me a picture on her cell phone of the petechiae on her left hand.  Which eventually faded and resolved.  No further intervention was needed.  History of hypothyroidism  History of kidney stones  Prediabetes  History of hematuria  Orders: Orders Placed This Encounter  Procedures   CBC with Differential/Platelet   Comprehensive metabolic panel with GFR   No orders of the defined types were placed in this encounter.    Follow-Up Instructions: Return in about 5 months (around 12/31/2023) for Rheumatoid arthritis, Osteoarthritis.   Nicholas Bari, MD  Note - This record has been created using Animal nutritionist.  Chart creation errors have been sought, but may not always  have been located. Such creation errors do  not reflect on  the standard of medical care.

## 2023-07-31 ENCOUNTER — Encounter: Payer: Self-pay | Admitting: Rheumatology

## 2023-07-31 ENCOUNTER — Ambulatory Visit: Payer: PRIVATE HEALTH INSURANCE | Attending: Rheumatology | Admitting: Rheumatology

## 2023-07-31 VITALS — BP 129/83 | HR 73 | Resp 16 | Ht 64.0 in | Wt 205.0 lb

## 2023-07-31 DIAGNOSIS — M1812 Unilateral primary osteoarthritis of first carpometacarpal joint, left hand: Secondary | ICD-10-CM | POA: Diagnosis not present

## 2023-07-31 DIAGNOSIS — M0579 Rheumatoid arthritis with rheumatoid factor of multiple sites without organ or systems involvement: Secondary | ICD-10-CM

## 2023-07-31 DIAGNOSIS — Z8639 Personal history of other endocrine, nutritional and metabolic disease: Secondary | ICD-10-CM

## 2023-07-31 DIAGNOSIS — R7303 Prediabetes: Secondary | ICD-10-CM

## 2023-07-31 DIAGNOSIS — M19041 Primary osteoarthritis, right hand: Secondary | ICD-10-CM | POA: Diagnosis not present

## 2023-07-31 DIAGNOSIS — M722 Plantar fascial fibromatosis: Secondary | ICD-10-CM

## 2023-07-31 DIAGNOSIS — M19042 Primary osteoarthritis, left hand: Secondary | ICD-10-CM

## 2023-07-31 DIAGNOSIS — M24521 Contracture, right elbow: Secondary | ICD-10-CM

## 2023-07-31 DIAGNOSIS — Z87448 Personal history of other diseases of urinary system: Secondary | ICD-10-CM

## 2023-07-31 DIAGNOSIS — Z87442 Personal history of urinary calculi: Secondary | ICD-10-CM

## 2023-07-31 DIAGNOSIS — Z79899 Other long term (current) drug therapy: Secondary | ICD-10-CM

## 2023-07-31 DIAGNOSIS — R21 Rash and other nonspecific skin eruption: Secondary | ICD-10-CM

## 2023-07-31 NOTE — Patient Instructions (Addendum)
 Standing Labs We placed an order today for your standing lab work.   Please have your standing labs drawn in July and every 3 months  Please have your labs drawn 2 weeks prior to your appointment so that the provider can discuss your lab results at your appointment, if possible.  Please note that you may see your imaging and lab results in MyChart before we have reviewed them. We will contact you once all results are reviewed. Please allow our office up to 72 hours to thoroughly review all of the results before contacting the office for clarification of your results.  WALK-IN LAB HOURS  Monday through Thursday from 8:00 am -12:30 pm and 1:00 pm-5:00 pm and Friday from 8:00 am-12:00 pm.  Patients with office visits requiring labs will be seen before walk-in labs.  You may encounter longer than normal wait times. Please allow additional time. Wait times may be shorter on  Monday and Thursday afternoons.  We do not book appointments for walk-in labs. We appreciate your patience and understanding with our staff.   Labs are drawn by [facility]. Please bring your co-pay at the time of your lab draw.  You may receive a bill from [facility] for your lab work.  Please note if you are on Hydroxychloroquine and and an order has been placed for a Hydroxychloroquine level,  you will need to have it drawn 4 hours or more after your last dose.  If you wish to have your labs drawn at another location, please call the office 24 hours in advance so we can fax the orders.  The office is located at [address], [place], [place] [address]   If you have any questions regarding directions or hours of operation,  please call [phone].   As a reminder, please drink plenty of water prior to coming for your lab work. Thanks!   Vaccines You are taking a medication(s) that can suppress your immune system.  The following immunizations are recommended: Flu annually Covid-19  Td/Tdap (tetanus, diphtheria,  pertussis) every 10 years Pneumonia (Prevnar 15 then Pneumovax 23 at least 1 year apart.  Alternatively, can take Prevnar 20 without needing additional dose) Shingrix: 2 doses from 4 weeks to 6 months apart  Please check with your PCP to make sure you are up to date.   If you have signs or symptoms of an infection or start antibiotics: First, call your PCP for workup of your infection. Hold your medication through the infection, until you complete your antibiotics, and until symptoms resolve if you take the following: Injectable medication (Actemra, Benlysta, Cimzia, Cosentyx, Enbrel, Humira , Kevzara, Orencia, Remicade, Simponi, Stelara, Taltz, Tremfya) Methotrexate  Leflunomide (Arava) Mycophenolate (Cellcept) Cloria Danger, Olumiant, or Rinvoq   Please get annual skin examination to screen for skin cancer.  Please use sunscreen and sun protection.

## 2023-08-01 LAB — CBC WITH DIFFERENTIAL/PLATELET
Absolute Lymphocytes: 1615 {cells}/uL (ref 850–3900)
Absolute Monocytes: 387 {cells}/uL (ref 200–950)
Basophils Absolute: 22 {cells}/uL (ref 0–200)
Basophils Relative: 0.5 %
Eosinophils Absolute: 101 {cells}/uL (ref 15–500)
Eosinophils Relative: 2.3 %
HCT: 43.8 % (ref 35.0–45.0)
Hemoglobin: 14.8 g/dL (ref 11.7–15.5)
MCH: 32.7 pg (ref 27.0–33.0)
MCHC: 33.8 g/dL (ref 32.0–36.0)
MCV: 96.9 fL (ref 80.0–100.0)
MPV: 12.9 fL — ABNORMAL HIGH (ref 7.5–12.5)
Monocytes Relative: 8.8 %
Neutro Abs: 2275 {cells}/uL (ref 1500–7800)
Neutrophils Relative %: 51.7 %
Platelets: 164 10*3/uL (ref 140–400)
RBC: 4.52 10*6/uL (ref 3.80–5.10)
RDW: 13.1 % (ref 11.0–15.0)
Total Lymphocyte: 36.7 %
WBC: 4.4 10*3/uL (ref 3.8–10.8)

## 2023-08-01 LAB — COMPREHENSIVE METABOLIC PANEL WITH GFR
AG Ratio: 1.5 (calc) (ref 1.0–2.5)
ALT: 20 U/L (ref 6–29)
AST: 22 U/L (ref 10–35)
Albumin: 4.3 g/dL (ref 3.6–5.1)
Alkaline phosphatase (APISO): 68 U/L (ref 37–153)
BUN: 14 mg/dL (ref 7–25)
CO2: 30 mmol/L (ref 20–32)
Calcium: 9.8 mg/dL (ref 8.6–10.4)
Chloride: 104 mmol/L (ref 98–110)
Creat: 0.74 mg/dL (ref 0.50–1.05)
Globulin: 2.8 g/dL (ref 1.9–3.7)
Glucose, Bld: 100 mg/dL — ABNORMAL HIGH (ref 65–99)
Potassium: 5.1 mmol/L (ref 3.5–5.3)
Sodium: 139 mmol/L (ref 135–146)
Total Bilirubin: 0.6 mg/dL (ref 0.2–1.2)
Total Protein: 7.1 g/dL (ref 6.1–8.1)
eGFR: 91 mL/min/{1.73_m2} (ref >=60)

## 2023-08-03 NOTE — Progress Notes (Signed)
 CBC and CMP are stable.

## 2023-08-05 ENCOUNTER — Other Ambulatory Visit: Payer: Self-pay | Admitting: Rheumatology

## 2023-08-05 DIAGNOSIS — M0579 Rheumatoid arthritis with rheumatoid factor of multiple sites without organ or systems involvement: Secondary | ICD-10-CM

## 2023-08-05 DIAGNOSIS — Z79899 Other long term (current) drug therapy: Secondary | ICD-10-CM

## 2023-08-05 NOTE — Telephone Encounter (Signed)
 Last Fill: 05/12/2023  Labs: 07/31/2023 CBC and CMP are stable.   TB Gold: 01/22/2023 Neg    Next Visit: 12/31/2023  Last Visit: 07/31/2023  DX: Rheumatoid arthritis involving multiple sites with positive rheumatoid factor   Current Dose per office note 07/31/2023: Adalimumab -adaz 40 mg sq injection every 14 days,   Okay to refill Hyrimoz ?

## 2023-09-11 ENCOUNTER — Other Ambulatory Visit: Payer: Self-pay | Admitting: Physician Assistant

## 2023-09-11 NOTE — Telephone Encounter (Signed)
 Last Fill: 04/09/2023  Labs: 07/31/2023 CBC and CMP are stable.   Next Visit: 12/31/2023  Last Visit: 07/31/2023  DX: Rheumatoid arthritis involving multiple sites with positive rheumatoid factor   Current Dose per office note 07/31/2023: methotrexate  2.5 mg 4 tablets every 7 days   Okay to refill Methotrexate ?

## 2023-10-13 ENCOUNTER — Telehealth: Payer: Self-pay

## 2023-10-13 NOTE — Telephone Encounter (Signed)
 Submitted a Prior Authorization request to [employer] for Adalimumab -Adaz via fax. Will update once we receive a response.  Fax: [phone]  Phone: [phone]  Deleta Colt PharmD Candidate [id number]  [facility]

## 2023-10-23 NOTE — Telephone Encounter (Signed)
 Received notification from [facility] regarding a prior authorization for [facility]. Any medication over $350 for 30 day supply is restricted. Patient will be contacted regarding next steps  Authorization # [id number]  Per notes regarding last year's auth: If any rejection at pharmacy level for biosimilars, [employer] will help place overrides.   Sherry Pennant, PharmD, MPH, BCPS, CPP Clinical Pharmacist (Rheumatology and Pulmonology)

## 2023-11-03 ENCOUNTER — Other Ambulatory Visit: Payer: Self-pay | Admitting: Rheumatology

## 2023-11-03 DIAGNOSIS — M0579 Rheumatoid arthritis with rheumatoid factor of multiple sites without organ or systems involvement: Secondary | ICD-10-CM

## 2023-11-03 DIAGNOSIS — Z79899 Other long term (current) drug therapy: Secondary | ICD-10-CM

## 2023-11-03 NOTE — Telephone Encounter (Signed)
 Last Fill: 08/05/2023  Labs: 07/31/2023 CBC and CMP are stable.   Contacted patient to update labs, stated she would be in tomorrow.   TB Gold: 01/22/2023 TB Gold Negative   Next Visit: 12/29/2023  Last Visit: 07/31/2023  DX: Rheumatoid arthritis involving multiple sites with positive rheumatoid factor   Current Dose per office note 07/31/2023: Humira  40 mg subcu every 14 days   Okay to refill Humira ?

## 2023-11-04 ENCOUNTER — Other Ambulatory Visit: Payer: Self-pay | Admitting: *Deleted

## 2023-11-04 DIAGNOSIS — Z79899 Other long term (current) drug therapy: Secondary | ICD-10-CM

## 2023-11-04 LAB — CBC WITH DIFFERENTIAL/PLATELET
Absolute Lymphocytes: 1509 {cells}/uL (ref 850–3900)
Absolute Monocytes: 466 {cells}/uL (ref 200–950)
Basophils Absolute: 40 {cells}/uL (ref 0–200)
Basophils Relative: 0.9 %
Eosinophils Absolute: 141 {cells}/uL (ref 15–500)
Eosinophils Relative: 3.2 %
HCT: 44.2 % (ref 35.0–45.0)
Hemoglobin: 14.8 g/dL (ref 11.7–15.5)
MCH: 33.8 pg — ABNORMAL HIGH (ref 27.0–33.0)
MCHC: 33.5 g/dL (ref 32.0–36.0)
MCV: 100.9 fL — ABNORMAL HIGH (ref 80.0–100.0)
MPV: 12.5 fL (ref 7.5–12.5)
Monocytes Relative: 10.6 %
Neutro Abs: 2244 {cells}/uL (ref 1500–7800)
Neutrophils Relative %: 51 %
Platelets: 144 Thousand/uL (ref 140–400)
RBC: 4.38 Million/uL (ref 3.80–5.10)
RDW: 13.1 % (ref 11.0–15.0)
Total Lymphocyte: 34.3 %
WBC: 4.4 Thousand/uL (ref 3.8–10.8)

## 2023-11-04 LAB — COMPREHENSIVE METABOLIC PANEL WITH GFR
AG Ratio: 1.5 (calc) (ref 1.0–2.5)
ALT: 24 U/L (ref 6–29)
AST: 19 U/L (ref 10–35)
Albumin: 4.2 g/dL (ref 3.6–5.1)
Alkaline phosphatase (APISO): 65 U/L (ref 37–153)
BUN: 21 mg/dL (ref 7–25)
CO2: 27 mmol/L (ref 20–32)
Calcium: 9.3 mg/dL (ref 8.6–10.4)
Chloride: 105 mmol/L (ref 98–110)
Creat: 0.68 mg/dL (ref 0.50–1.05)
Globulin: 2.8 g/dL (ref 1.9–3.7)
Glucose, Bld: 77 mg/dL (ref 65–99)
Potassium: 4.7 mmol/L (ref 3.5–5.3)
Sodium: 140 mmol/L (ref 135–146)
Total Bilirubin: 0.4 mg/dL (ref 0.2–1.2)
Total Protein: 7 g/dL (ref 6.1–8.1)
eGFR: 98 mL/min/1.73m2 (ref >=60)

## 2023-11-05 ENCOUNTER — Ambulatory Visit: Payer: Self-pay | Admitting: Physician Assistant

## 2023-11-05 NOTE — Progress Notes (Signed)
 She can increase folic acid  to 2 mg daily.

## 2023-11-12 ENCOUNTER — Other Ambulatory Visit: Payer: Self-pay | Admitting: Family Medicine

## 2023-11-30 ENCOUNTER — Other Ambulatory Visit: Payer: Self-pay | Admitting: Rheumatology

## 2023-11-30 DIAGNOSIS — Z79899 Other long term (current) drug therapy: Secondary | ICD-10-CM

## 2023-11-30 DIAGNOSIS — M0579 Rheumatoid arthritis with rheumatoid factor of multiple sites without organ or systems involvement: Secondary | ICD-10-CM

## 2023-12-01 NOTE — Telephone Encounter (Signed)
 Last Fill: 11/03/2023 (2mL)  Labs: 11/04/2023 CMP WNL MCV and MCH are borderline elevated.  TB Gold: 01/22/2023 negative    Next Visit: 12/29/2023  Last Visit: 07/31/2023  IK:Myzlfjunpi arthritis involving multiple sites with positive rheumatoid factor   Current Dose per office note on 07/31/2023: Adalimumab -adaz 40 mg sq injection every 14 days   Okay to refill Adalimumab -adaz?

## 2023-12-02 ENCOUNTER — Other Ambulatory Visit: Payer: Self-pay | Admitting: Physician Assistant

## 2023-12-02 NOTE — Telephone Encounter (Signed)
 Last Fill: 09/11/2023  Labs: 11/04/2023 CMP WNL MCV and MCH are borderline elevated.  Please clarify if she has been taking folic acid  as prescribed? Rest of CBC WNL She can increase folic acid  to 2 mg daily.   Next Visit: 12/29/2023  Last Visit: 07/31/2023  DX: Rheumatoid arthritis involving multiple sites with positive rheumatoid factor (HCC)   Current Dose per office note 07/31/2023: methotrexate  2.5 mg 4 tablets every 7 days   Okay to refill Methotrexate ?

## 2023-12-15 NOTE — Progress Notes (Unsigned)
 Office Visit Note  Patient: Amy Hart             Date of Birth: 1960/12/25           MRN: [id number]             PCP: Avelina Greig BRAVO, MD Referring: Avelina Greig BRAVO, MD Visit Date: 12/29/2023 Occupation: @GUAROCC @  Subjective:  Medication monitoring   History of Present Illness: Amy Hart is a 63 y.o. female with history of seropositive rheumatoid arthritis and osteoarthritis.  Patient remains on  Adalimumab -adaz 40 mg sq injection every 14 days, methotrexate  2.5 mg 4 tablets every 7 days, and folic acid  1 mg 1 tablet daily.  She is tolerating combination therapy without any side effects and has not had any recent gaps in therapy.  She denies any recent or recurrent infections.  She denies any joint swelling at this time.  Patient states that she has been using castor oil on both knees as well as the left thumb and wrist which has helped to alleviate joint pain.  Patient states that the ganglion cyst on the radial aspect of her left wrist has also reduced in size.   Activities of Daily Living:  Patient reports morning stiffness for 5 minutes.   Patient Reports nocturnal pain.  Difficulty dressing/grooming: Denies Difficulty climbing stairs: Denies Difficulty getting out of chair: Denies Difficulty using hands for taps, buttons, cutlery, and/or writing: Denies  Review of Systems  Constitutional:  Negative for fatigue.  HENT:  Negative for mouth sores and mouth dryness.   Eyes:  Positive for dryness.  Respiratory:  Negative for shortness of breath.   Cardiovascular:  Negative for chest pain and palpitations.  Gastrointestinal:  Negative for blood in stool, constipation and diarrhea.  Endocrine: Negative for increased urination.  Genitourinary:  Negative for involuntary urination.  Musculoskeletal:  Positive for joint pain, joint pain, joint swelling and morning stiffness. Negative for gait problem, myalgias, muscle weakness, muscle tenderness and myalgias.  Skin:  Negative  for color change, rash, hair loss and sensitivity to sunlight.  Allergic/Immunologic: Negative for susceptible to infections.  Neurological:  Negative for dizziness and headaches.  Hematological:  Negative for swollen glands.  Psychiatric/Behavioral:  Positive for sleep disturbance. Negative for depressed mood. The patient is not nervous/anxious.     PMFS History:  Patient Active Problem List   Diagnosis Date Noted   Osteopenia 04/09/2023   Hyperkalemia 04/09/2023   Hx of adenomatous polyp of colon 04/01/2022   Class 1 obesity without serious comorbidity with body mass index (BMI) of 33.0 to 33.9 in adult 03/09/2020   Prediabetes 01/08/2017   Epistaxis, recurrent 12/05/2014   Menopausal syndrome 12/05/2014   Rheumatoid arthritis (HCC) 01/02/2012   Pure hypercholesterolemia 08/29/2009   Osteoarthritis, hands 08/24/2009   Disorder of bursae and tendons in shoulder region 08/04/2007   Hypothyroidism 05/19/2007   ALLERGIC RHINITIS, SEASONAL 05/19/2007    Past Medical History:  Diagnosis Date   Allergic rhinitis due to pollen    Arthritis    Collagen vascular disease    COVID-19 06/2021   Hx of adenomatous polyp of colon 04/01/2022   Diminutive adenoma recall 2033   Plantar fasciitis of right foot    Rheumatoid arthritis(714.0)    Unspecified hypothyroidism     Family History  Problem Relation Age of Onset   Hypertension Mother    Thyroid  disease Mother    Nephrolithiasis Mother    Diabetes Father    Kidney Stones Sister  Colon cancer Neg Hx    Colon polyps Neg Hx    Esophageal cancer Neg Hx    Rectal cancer Neg Hx    Stomach cancer Neg Hx    Past Surgical History:  Procedure Laterality Date   BREAST BIOPSY  04/07/2001   right, negative   COLONOSCOPY     ENDOMETRIAL ABLATION  04/08/2003   FRACTURE SURGERY  04/08/1999   right elbow   ROTATOR CUFF REPAIR  06/05/2008   R. Cuff tear   Greg Cratty BUNIONECTOMY     right   TREATMENT FISTULA ANAL  04/08/1991   repair    Social History   Social History Narrative   No exercise   Diet: Moderate   She has a Social research officer, government   Lives with her partner   Immunization History  Administered Date(s) Administered   Influenza Inj Mdck Quad Pf 01/09/2018   Influenza, Mdck, Trivalent,PF 6+ MOS(egg free) 12/25/2022   Influenza,inj,Quad PF,6+ Mos 12/07/2015, 01/09/2018, 01/19/2019, 01/24/2020, 01/16/2021, 12/31/2021   Influenza-Unspecified 01/10/2015, 01/08/2017   PFIZER(Purple Top)SARS-COV-2 Vaccination 06/17/2019, 07/12/2019, 01/27/2020, 07/27/2020   Pfizer Covid-19 Vaccine Bivalent Booster 10yrs & up 01/25/2021   Pfizer(Comirnaty)Fall Seasonal Vaccine 12 years and older 12/31/2021, 12/25/2022   Pneumococcal Conjugate-13 12/07/2015   Pneumococcal Polysaccharide-23 01/08/2017   Respiratory Syncytial Virus Vaccine,Recomb Aduvanted(Arexvy) 05/16/2022   Td 02/05/1998, 07/13/2008   Tdap 03/08/2019   Zoster Recombinant(Shingrix) 03/08/2019, 05/10/2019   Zoster, Live 09/09/2013     Objective: Vital Signs: BP 123/75   Pulse 65   Temp 97.9 F (36.6 C)   Resp 14   Ht 5' 4 (1.626 m)   Wt 210 lb 6.4 oz (95.4 kg)   BMI 36.12 kg/m    Physical Exam Vitals and nursing note reviewed.  Constitutional:      Appearance: She is well-developed.  HENT:     Head: Normocephalic and atraumatic.  Eyes:     Conjunctiva/sclera: Conjunctivae normal.  Cardiovascular:     Rate and Rhythm: Normal rate and regular rhythm.     Heart sounds: Normal heart sounds.  Pulmonary:     Effort: Pulmonary effort is normal.     Breath sounds: Normal breath sounds.  Abdominal:     General: Bowel sounds are normal.     Palpations: Abdomen is soft.  Musculoskeletal:     Cervical back: Normal range of motion.  Lymphadenopathy:     Cervical: No cervical adenopathy.  Skin:    General: Skin is warm and dry.     Capillary Refill: Capillary refill takes less than 2 seconds.  Neurological:     Mental Status: She is alert and oriented  to person, place, and time.  Psychiatric:        Behavior: Behavior normal.      Musculoskeletal Exam: C-spine, thoracic spine, lumbar spine have good range of motion.  No midline spinal tenderness.  No SI joint tenderness.  Shoulder joints have good range of motion with no discomfort.  Flexion contracture of the right elbow noted.  CMC, PIP, DIP thickening consistent with osteoarthritis of both hands.  No synovitis of the MCP joints.  Complete fist formation bilaterally.  Hip joints have good range of motion with no groin pain.  Knee joints have good range of motion no warmth or effusion.  Ankle joints have good range of motion no tenderness or joint swelling.  No evidence of Achilles tendinitis or plantar fasciitis.   CDAI Exam: CDAI Score: -- Patient Global: --; Provider Global: -- Swollen: --;  Tender: -- Joint Exam 12/29/2023   No joint exam has been documented for this visit   There is currently no information documented on the homunculus. Go to the Rheumatology activity and complete the homunculus joint exam.  Investigation: No additional findings.  Imaging: No results found.  Recent Labs: Lab Results  Component Value Date   WBC 4.4 11/04/2023   HGB 14.8 11/04/2023   PLT 144 11/04/2023   NA 140 11/04/2023   K 4.7 11/04/2023   CL 105 11/04/2023   CO2 27 11/04/2023   GLUCOSE 77 11/04/2023   BUN 21 11/04/2023   CREATININE 0.68 11/04/2023   BILITOT 0.4 11/04/2023   ALKPHOS 72 04/02/2023   AST 19 11/04/2023   ALT 24 11/04/2023   PROT 7.0 11/04/2023   ALBUMIN 4.2 04/02/2023   CALCIUM 9.3 11/04/2023   GFRAA 89 09/12/2020   QFTBGOLDPLUS NEGATIVE 01/22/2023    Speciality Comments: No specialty comments available.  Procedures:  No procedures performed Allergies: Naproxen     Assessment / Plan:     Visit Diagnoses: Rheumatoid arthritis involving multiple sites with positive rheumatoid factor (HCC): She has no synovitis on examination today.  She has clinically  been doing well on adalimumab  40 mg sq injections every 14 days and methotrexate  4 tablets by mouth once weekly.  She is tolerating combination therapy without any side effects and has not had any gaps in therapy.  Her symptoms have been well-controlled on the current treatment regimen.  No medication changes will be made at this time.  She was advised to notify us  if she develops signs or symptoms of a flare.  She will follow-up in the office in 5 months or sooner if needed.  High risk medication use - Adalimumab -adaz 40 mg sq injection every 14 days, methotrexate  2.5 mg 4 tablets every 7 days, and folic acid  1 mg 1 tablet daily. CBC and CMP updated on 11/04/23.  Her next lab work will be due in October and every 3 months. TB gold negative on 01/22/23. Future order for TB gold placed today.  No recent or recurrent infections.  Discussed the importance of holding adalimumab  and methotrexate  if she develops signs or symptoms of an infection and to resume once the infection has completely cleared.  She is planning on receiving the COVID-19 vaccine.  Discussed the importance of holding methotrexate  for 1 week after receiving the COVID-vaccine.  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  Her pain level is currently well controlled.   Primary osteoarthritis of both hands: CMC, PIP, DIP thickening consistent with osteoarthritis of both hands.  No synovitis noted.  Contracture of elbow joint, right: No tenderness along the joint line.  Plantar fasciitis of left foot - Dr. Rosiland.  Other medical conditions are listed as follows:  History of hypothyroidism  History of kidney stones  Prediabetes  History of hematuria  Orders: Orders Placed This Encounter  Procedures   QuantiFERON-TB Gold Plus   No orders of the defined types were placed in this encounter.    Follow-Up Instructions: Return in  about 5 months (around 05/30/2024) for Rheumatoid arthritis.   Waddell CHRISTELLA Craze, PA-C  Note - This record has been created using Dragon software.  Chart creation errors have been sought, but may not always  have been located. Such creation errors do not reflect on  the standard of medical care.

## 2023-12-29 ENCOUNTER — Encounter: Payer: Self-pay | Admitting: Physician Assistant

## 2023-12-29 ENCOUNTER — Ambulatory Visit: Payer: PRIVATE HEALTH INSURANCE | Attending: Physician Assistant | Admitting: Physician Assistant

## 2023-12-29 VITALS — BP 123/75 | HR 65 | Temp 97.9°F | Resp 14 | Ht 64.0 in | Wt 210.4 lb

## 2023-12-29 DIAGNOSIS — M19042 Primary osteoarthritis, left hand: Secondary | ICD-10-CM

## 2023-12-29 DIAGNOSIS — R7303 Prediabetes: Secondary | ICD-10-CM

## 2023-12-29 DIAGNOSIS — Z79899 Other long term (current) drug therapy: Secondary | ICD-10-CM | POA: Diagnosis not present

## 2023-12-29 DIAGNOSIS — Z87448 Personal history of other diseases of urinary system: Secondary | ICD-10-CM

## 2023-12-29 DIAGNOSIS — M0579 Rheumatoid arthritis with rheumatoid factor of multiple sites without organ or systems involvement: Secondary | ICD-10-CM

## 2023-12-29 DIAGNOSIS — M722 Plantar fascial fibromatosis: Secondary | ICD-10-CM

## 2023-12-29 DIAGNOSIS — Z87442 Personal history of urinary calculi: Secondary | ICD-10-CM

## 2023-12-29 DIAGNOSIS — M1812 Unilateral primary osteoarthritis of first carpometacarpal joint, left hand: Secondary | ICD-10-CM | POA: Diagnosis not present

## 2023-12-29 DIAGNOSIS — Z111 Encounter for screening for respiratory tuberculosis: Secondary | ICD-10-CM

## 2023-12-29 DIAGNOSIS — M19041 Primary osteoarthritis, right hand: Secondary | ICD-10-CM

## 2023-12-29 DIAGNOSIS — M24521 Contracture, right elbow: Secondary | ICD-10-CM

## 2023-12-29 DIAGNOSIS — Z8639 Personal history of other endocrine, nutritional and metabolic disease: Secondary | ICD-10-CM

## 2023-12-29 NOTE — Patient Instructions (Signed)
 Standing Labs We placed an order today for your standing lab work.   Please have your standing labs drawn at the end of October and every 3 months Please have your labs drawn 2 weeks prior to your appointment so that the provider can discuss your lab results at your appointment, if possible.  Please note that you may see your imaging and lab results in MyChart before we have reviewed them. We will contact you once all results are reviewed. Please allow our office up to 72 hours to thoroughly review all of the results before contacting the office for clarification of your results.  WALK-IN LAB HOURS  Monday through Thursday from 8:00 am -12:30 pm and 1:00 pm-4:30 pm and Friday from 8:00 am-12:00 pm.  Patients with office visits requiring labs will be seen before walk-in labs.  You may encounter longer than normal wait times. Please allow additional time. Wait times may be shorter on  Monday and Thursday afternoons.  We do not book appointments for walk-in labs. We appreciate your patience and understanding with our staff.   Labs are drawn by [facility]. Please bring your co-pay at the time of your lab draw.  You may receive a bill from [facility] for your lab work.  Please note if you are on Hydroxychloroquine  and and an order has been placed for a Hydroxychloroquine  level,  you will need to have it drawn 4 hours or more after your last dose.  If you wish to have your labs drawn at another location, please call the office 24 hours in advance so we can fax the orders.  The office is located at [address], [place], [place] [address]   If you have any questions regarding directions or hours of operation,  please call [phone].   As a reminder, please drink plenty of water prior to coming for your lab work. Thanks!

## 2023-12-31 ENCOUNTER — Ambulatory Visit: Payer: PRIVATE HEALTH INSURANCE | Admitting: Physician Assistant

## 2024-02-03 ENCOUNTER — Other Ambulatory Visit: Payer: Self-pay | Admitting: *Deleted

## 2024-02-03 DIAGNOSIS — Z79899 Other long term (current) drug therapy: Secondary | ICD-10-CM

## 2024-02-03 DIAGNOSIS — Z111 Encounter for screening for respiratory tuberculosis: Secondary | ICD-10-CM

## 2024-02-04 ENCOUNTER — Ambulatory Visit: Payer: Self-pay | Admitting: Rheumatology

## 2024-02-04 NOTE — Progress Notes (Signed)
 CBC and CMP are stable.

## 2024-02-05 LAB — CBC WITH DIFFERENTIAL/PLATELET
Absolute Lymphocytes: 1339 {cells}/uL (ref 850–3900)
Absolute Monocytes: 451 {cells}/uL (ref 200–950)
Basophils Absolute: 29 {cells}/uL (ref 0–200)
Basophils Relative: 0.6 %
Eosinophils Absolute: 82 {cells}/uL (ref 15–500)
Eosinophils Relative: 1.7 %
HCT: 41.5 % (ref 35.0–45.0)
Hemoglobin: 14 g/dL (ref 11.7–15.5)
MCH: 33 pg (ref 27.0–33.0)
MCHC: 33.7 g/dL (ref 32.0–36.0)
MCV: 97.9 fL (ref 80.0–100.0)
MPV: 13 fL — ABNORMAL HIGH (ref 7.5–12.5)
Monocytes Relative: 9.4 %
Neutro Abs: 2899 {cells}/uL (ref 1500–7800)
Neutrophils Relative %: 60.4 %
Platelets: 162 Thousand/uL (ref 140–400)
RBC: 4.24 Million/uL (ref 3.80–5.10)
RDW: 12.5 % (ref 11.0–15.0)
Total Lymphocyte: 27.9 %
WBC: 4.8 Thousand/uL (ref 3.8–10.8)

## 2024-02-05 LAB — COMPREHENSIVE METABOLIC PANEL WITH GFR
AG Ratio: 1.8 (calc) (ref 1.0–2.5)
ALT: 16 U/L (ref 6–29)
AST: 17 U/L (ref 10–35)
Albumin: 4.4 g/dL (ref 3.6–5.1)
Alkaline phosphatase (APISO): 68 U/L (ref 37–153)
BUN: 17 mg/dL (ref 7–25)
CO2: 26 mmol/L (ref 20–32)
Calcium: 9.4 mg/dL (ref 8.6–10.4)
Chloride: 104 mmol/L (ref 98–110)
Creat: 0.69 mg/dL (ref 0.50–1.05)
Globulin: 2.4 g/dL (ref 1.9–3.7)
Glucose, Bld: 96 mg/dL (ref 65–99)
Potassium: 4.7 mmol/L (ref 3.5–5.3)
Sodium: 138 mmol/L (ref 135–146)
Total Bilirubin: 0.6 mg/dL (ref 0.2–1.2)
Total Protein: 6.8 g/dL (ref 6.1–8.1)
eGFR: 98 mL/min/1.73m2 (ref >=60)

## 2024-02-05 LAB — QUANTIFERON-TB GOLD PLUS
Mitogen-NIL: 5.09 [IU]/mL
NIL: 0.03 [IU]/mL
QuantiFERON-TB Gold Plus: NEGATIVE
TB1-NIL: <0 [IU]/mL
TB2-NIL: <0 [IU]/mL

## 2024-02-07 ENCOUNTER — Ambulatory Visit: Payer: Self-pay | Admitting: Physician Assistant

## 2024-02-07 NOTE — Progress Notes (Signed)
 TB gold negative

## 2024-02-11 ENCOUNTER — Other Ambulatory Visit: Payer: Self-pay | Admitting: Family Medicine

## 2024-02-16 ENCOUNTER — Other Ambulatory Visit: Payer: Self-pay | Admitting: Rheumatology

## 2024-02-16 NOTE — Telephone Encounter (Signed)
 Last Fill: 12/02/2023  Labs: 02/03/2024 CBC and CMP are stable.   Next Visit: 06/02/2024  Last Visit: 12/29/2023  DX: Rheumatoid arthritis involving multiple sites with positive rheumatoid factor   Current Dose per office note 12/29/2023: methotrexate  2.5 mg 4 tablets every 7 days   Okay to refill Methotrexate ?

## 2024-02-18 ENCOUNTER — Other Ambulatory Visit: Payer: Self-pay | Admitting: Rheumatology

## 2024-02-18 DIAGNOSIS — Z79899 Other long term (current) drug therapy: Secondary | ICD-10-CM

## 2024-02-18 DIAGNOSIS — M0579 Rheumatoid arthritis with rheumatoid factor of multiple sites without organ or systems involvement: Secondary | ICD-10-CM

## 2024-02-18 NOTE — Telephone Encounter (Signed)
 Last Fill: 12/01/2023  Labs: 02/03/2024 CBC and CMP are stable.   TB Gold: 02/03/2024 Neg    Next Visit: 06/02/2024  Last Visit: 12/29/2023  DX: Rheumatoid arthritis involving multiple sites with positive rheumatoid factor   Current Dose per office note 12/29/2023: Adalimumab -adaz 40 mg sq injection every 14 days   Okay to refill Adalimumab -adaz?

## 2024-04-21 ENCOUNTER — Ambulatory Visit (INDEPENDENT_AMBULATORY_CARE_PROVIDER_SITE_OTHER): Payer: PRIVATE HEALTH INSURANCE | Admitting: Family Medicine

## 2024-04-21 ENCOUNTER — Ambulatory Visit: Payer: Self-pay | Admitting: Family Medicine

## 2024-04-21 ENCOUNTER — Other Ambulatory Visit (HOSPITAL_COMMUNITY)
Admission: RE | Admit: 2024-04-21 | Discharge: 2024-04-21 | Disposition: A | Payer: PRIVATE HEALTH INSURANCE | Source: Ambulatory Visit | Attending: Family Medicine | Admitting: Family Medicine

## 2024-04-21 VITALS — BP 110/70 | HR 91 | Temp 98.3°F | Ht 63.5 in | Wt 209.2 lb

## 2024-04-21 DIAGNOSIS — E7800 Pure hypercholesterolemia, unspecified: Secondary | ICD-10-CM

## 2024-04-21 DIAGNOSIS — R7303 Prediabetes: Secondary | ICD-10-CM

## 2024-04-21 DIAGNOSIS — E039 Hypothyroidism, unspecified: Secondary | ICD-10-CM

## 2024-04-21 DIAGNOSIS — Z0000 Encounter for general adult medical examination without abnormal findings: Secondary | ICD-10-CM

## 2024-04-21 DIAGNOSIS — M0579 Rheumatoid arthritis with rheumatoid factor of multiple sites without organ or systems involvement: Secondary | ICD-10-CM | POA: Diagnosis not present

## 2024-04-21 DIAGNOSIS — Z124 Encounter for screening for malignant neoplasm of cervix: Secondary | ICD-10-CM

## 2024-04-21 LAB — COMPREHENSIVE METABOLIC PANEL WITH GFR
ALT: 16 U/L (ref 3–35)
AST: 15 U/L (ref 5–37)
Albumin: 4.3 g/dL (ref 3.5–5.2)
Alkaline Phosphatase: 71 U/L (ref 39–117)
BUN: 22 mg/dL (ref 6–23)
CO2: 31 meq/L (ref 19–32)
Calcium: 9.3 mg/dL (ref 8.4–10.5)
Chloride: 103 meq/L (ref 96–112)
Creatinine, Ser: 0.75 mg/dL (ref 0.40–1.20)
GFR: 84.97 mL/min
Glucose, Bld: 99 mg/dL (ref 70–99)
Potassium: 4.8 meq/L (ref 3.5–5.1)
Sodium: 139 meq/L (ref 135–145)
Total Bilirubin: 0.5 mg/dL (ref 0.2–1.2)
Total Protein: 7.1 g/dL (ref 6.0–8.3)

## 2024-04-21 LAB — LIPID PANEL
Cholesterol: 219 mg/dL — ABNORMAL HIGH (ref 28–200)
HDL: 59.5 mg/dL
LDL Cholesterol: 117 mg/dL — ABNORMAL HIGH (ref 10–99)
NonHDL: 159.86
Total CHOL/HDL Ratio: 4
Triglycerides: 216 mg/dL — ABNORMAL HIGH (ref 10.0–149.0)
VLDL: 43.2 mg/dL — ABNORMAL HIGH (ref 0.0–40.0)

## 2024-04-21 LAB — HEMOGLOBIN A1C: Hgb A1c MFr Bld: 5.6 % (ref 4.6–6.5)

## 2024-04-21 LAB — T3, FREE: T3, Free: 3 pg/mL (ref 2.3–4.2)

## 2024-04-21 LAB — TSH: TSH: 1.02 u[IU]/mL (ref 0.35–5.50)

## 2024-04-21 LAB — T4, FREE: Free T4: 0.94 ng/dL (ref 0.60–1.60)

## 2024-04-21 MED ORDER — TRAMADOL HCL 50 MG PO TABS: 50.0000 mg | ORAL_TABLET | Freq: Three times a day (TID) | ORAL | 0 refills | Status: AC | PRN

## 2024-04-21 MED ORDER — BENZONATATE 200 MG PO CAPS: 200.0000 mg | ORAL_CAPSULE | Freq: Two times a day (BID) | ORAL | 0 refills | Status: AC | PRN

## 2024-04-21 NOTE — Assessment & Plan Note (Signed)
 BMI 36 with hypercholesterolemia co-morbidity.

## 2024-04-21 NOTE — Progress Notes (Signed)
 "   Patient ID: KARINA NOFSINGER, female    DOB: November 15, 1960, 64 y.o.   MRN: [id number]  This visit was conducted in person.  BP 110/70   Pulse 91   Temp 98.3 F (36.8 C) (Oral)   Ht 5' 3.5 (1.613 m)   Wt 209 lb 4 oz (94.9 kg)   SpO2 96%   BMI 36.49 kg/m    CC:  Chief Complaint  Patient presents with   Annual Exam    Subjective:   HPI: AUDIA AMICK is a 64 y.o. female presenting on 04/21/2024 for Annual Exam  The patient presents for complete physical and review of chronic health problems. He/She also has the following acute concerns today:  resolving cough from recent flu.  No continued flu symptoms otherwise.  Elevated Cholesterol:  well controlled with diet Lab Results  Component Value Date   CHOL 193 04/02/2023   HDL 62.70 04/02/2023   LDLCALC 110 (H) 04/02/2023   LDLDIRECT 131.3 11/25/2011   TRIG 105.0 04/02/2023   CHOLHDL 3 04/02/2023  Using medications without problems: Muscle aches:  Diet compliance: heart healthy diet Exercise:  minimal given partner with lots of health issues. Other complaints:  Prediabetes  resolved Lab Results  Component Value Date   HGBA1C 5.6 04/02/2023   Hypothyroidism At goal on levo 100 mcg Lab Results  Component Value Date   TSH 2.31 04/02/2023   RA:  On methotrexate , Humira . Followed by Dr, Dolphus Rheum.   Using tramadol  50 mg as needed.. # 30 in 1 year.  Relevant past medical, surgical, family and social history reviewed and updated as indicated. Interim medical history since our last visit reviewed. Allergies and medications reviewed and updated. Outpatient Medications Prior to Visit  Medication Sig Dispense Refill   Adalimumab -adaz 40 MG/0.4ML SOAJ INJECT 1 PEN UNDER THE SKIN EVERY 14 DAYS 0.8 mL 2   Cholecalciferol (VITAMIN D-3) 1000 units CAPS Take 1 capsule by mouth daily.     folic acid  (FOLVITE ) 1 MG tablet Take 2 mg by mouth daily.     levothyroxine  (SYNTHROID ) 100 MCG tablet TAKE 1 TABLET BY MOUTH EVERY DAY  90 tablet 0   methotrexate  (RHEUMATREX) 2.5 MG tablet TAKE 4 TABLETS (10 MG TOTAL) BY MOUTH ONCE A WEEK. CAUTION:CHEMOTHERAPY. PROTECT FROM LIGHT. 48 tablet 0   Multiple Vitamins-Minerals (MULTIVITAMIN ADULT PO) Take by mouth daily.     TURMERIC PO Take 1,000 mg by mouth daily.     vitamin C (ASCORBIC ACID) 500 MG tablet Take 500 mg by mouth daily.     benzonatate  (TESSALON ) 200 MG capsule Take 1 capsule (200 mg total) by mouth 2 (two) times daily as needed for cough. 30 capsule 0   folic acid  (FOLVITE ) 1 MG tablet TAKE 1 TABLET BY MOUTH EVERY DAY (Patient taking differently: Take 2 mg by mouth daily.) 90 tablet 3   No facility-administered medications prior to visit.     Per HPI unless specifically indicated in ROS section below Review of Systems  Constitutional:  Negative for fatigue and fever.  HENT:  Negative for congestion.   Eyes:  Negative for pain.  Respiratory:  Negative for cough and shortness of breath.   Cardiovascular:  Negative for chest pain, palpitations and leg swelling.  Gastrointestinal:  Negative for abdominal pain.  Genitourinary:  Negative for dysuria and vaginal bleeding.  Musculoskeletal:  Negative for back pain.  Neurological:  Negative for syncope, light-headedness and headaches.  Psychiatric/Behavioral:  Negative for dysphoric mood.  Objective:  BP 110/70   Pulse 91   Temp 98.3 F (36.8 C) (Oral)   Ht 5' 3.5 (1.613 m)   Wt 209 lb 4 oz (94.9 kg)   SpO2 96%   BMI 36.49 kg/m   Wt Readings from Last 3 Encounters:  04/21/24 209 lb 4 oz (94.9 kg)  12/29/23 210 lb 6.4 oz (95.4 kg)  07/31/23 205 lb (93 kg)      Physical Exam Exam conducted with a chaperone present.  Constitutional:      General: She is not in acute distress.    Appearance: Normal appearance. She is well-developed. She is obese. She is not ill-appearing or toxic-appearing.  HENT:     Head: Normocephalic.     Right Ear: Hearing, tympanic membrane, ear canal and external ear normal.  Tympanic membrane is not erythematous, retracted or bulging.     Left Ear: Hearing, tympanic membrane, ear canal and external ear normal. Tympanic membrane is not erythematous, retracted or bulging.     Nose: No mucosal edema or rhinorrhea.     Right Sinus: No maxillary sinus tenderness or frontal sinus tenderness.     Left Sinus: No maxillary sinus tenderness or frontal sinus tenderness.     Mouth/Throat:     Pharynx: Uvula midline.  Eyes:     General: Lids are normal. Lids are everted, no foreign bodies appreciated.     Conjunctiva/sclera: Conjunctivae normal.     Pupils: Pupils are equal, round, and reactive to light.  Neck:     Thyroid : No thyroid  mass or thyromegaly.     Vascular: No carotid bruit.     Trachea: Trachea normal.  Cardiovascular:     Rate and Rhythm: Normal rate and regular rhythm.     Pulses: Normal pulses.     Heart sounds: Normal heart sounds, S1 normal and S2 normal. No murmur heard.    No friction rub. No gallop.  Pulmonary:     Effort: Pulmonary effort is normal. No tachypnea or respiratory distress.     Breath sounds: Normal breath sounds. No decreased breath sounds, wheezing, rhonchi or rales.  Abdominal:     General: Bowel sounds are normal.     Palpations: Abdomen is soft.     Tenderness: There is no abdominal tenderness.     Hernia: There is no hernia in the left inguinal area or right inguinal area.  Genitourinary:    Exam position: Lithotomy position.     Labia:        Right: No rash.        Left: No rash.      Vagina: Normal.     Cervix: Normal. No discharge, friability, lesion or erythema.     Uterus: With uterine prolapse. Not deviated, not enlarged and not tender.      Adnexa: Left adnexa normal.       Right: No mass.         Left: No mass.       Rectum: Normal.  Musculoskeletal:     Cervical back: Normal range of motion and neck supple.  Skin:    General: Skin is warm and dry.     Findings: No rash.  Neurological:     Mental Status:  She is alert.  Psychiatric:        Mood and Affect: Mood is not anxious or depressed.        Speech: Speech normal.        Behavior: Behavior normal. Behavior  is cooperative.        Thought Content: Thought content normal.        Judgment: Judgment normal.       Results for orders placed or performed in visit on 02/03/24  QuantiFERON-TB Gold Plus   Collection Time: 02/03/24  3:16 PM  Result Value Ref Range   QuantiFERON-TB Gold Plus NEGATIVE NEGATIVE   NIL 0.03 IU/mL   Mitogen-NIL 5.09 IU/mL   TB1-NIL <0.00 IU/mL   TB2-NIL <0.00 IU/mL  Comprehensive metabolic panel with GFR   Collection Time: 02/03/24  3:16 PM  Result Value Ref Range   Glucose, Bld 96 65 - 99 mg/dL   BUN 17 7 - 25 mg/dL   Creat 9.30 9.49 - 8.94 mg/dL   eGFR 98 > OR = 60 fO/fpw/8.26f7   BUN/Creatinine Ratio SEE NOTE: 6 - 22 (calc)   Sodium 138 135 - 146 mmol/L   Potassium 4.7 3.5 - 5.3 mmol/L   Chloride 104 98 - 110 mmol/L   CO2 26 20 - 32 mmol/L   Calcium 9.4 8.6 - 10.4 mg/dL   Total Protein 6.8 6.1 - 8.1 g/dL   Albumin 4.4 3.6 - 5.1 g/dL   Globulin 2.4 1.9 - 3.7 g/dL (calc)   AG Ratio 1.8 1.0 - 2.5 (calc)   Total Bilirubin 0.6 0.2 - 1.2 mg/dL   Alkaline phosphatase (APISO) 68 37 - 153 U/L   AST 17 10 - 35 U/L   ALT 16 6 - 29 U/L  CBC with Differential/Platelet   Collection Time: 02/03/24  3:16 PM  Result Value Ref Range   WBC 4.8 3.8 - 10.8 Thousand/uL   RBC 4.24 3.80 - 5.10 Million/uL   Hemoglobin 14.0 11.7 - 15.5 g/dL   HCT 58.4 64.9 - 54.9 %   MCV 97.9 80.0 - 100.0 fL   MCH 33.0 27.0 - 33.0 pg   MCHC 33.7 32.0 - 36.0 g/dL   RDW 87.4 88.9 - 84.9 %   Platelets 162 140 - 400 Thousand/uL   MPV 13.0 (H) 7.5 - 12.5 fL   Neutro Abs 2,899 1,500 - 7,800 cells/uL   Absolute Lymphocytes 1,339 850 - 3,900 cells/uL   Absolute Monocytes 451 200 - 950 cells/uL   Eosinophils Absolute 82 15 - 500 cells/uL   Basophils Absolute 29 0 - 200 cells/uL   Neutrophils Relative % 60.4 %   Total Lymphocyte 27.9 %    Monocytes Relative 9.4 %   Eosinophils Relative 1.7 %   Basophils Relative 0.6 %     COVID 19 screen:  No recent travel or known exposure to COVID19 The patient denies respiratory symptoms of COVID 19 at this time. The importance of social distancing was discussed today.   Assessment and Plan The patient's preventative maintenance and recommended screening tests for an annual wellness exam were reviewed in full today. Brought up to date unless services declined.  Counselled on the importance of diet, exercise, and its role in overall health and mortality. The patient's FH and SH was reviewed, including their home life, tobacco status, and drug and alcohol status.   Vaccines: flu, Due for prevnar 20, S/P COVID x 3  Nonsmoker.   PAP/DVE: Q 5year, last done 03/2019 nml , no HPV , repeat DUE Mammo: 10/2022, q2 years no family history for breast cancer. Colon: 03/2022 Dr. Avram, nml repeat in 10 years..  STD screening: refused  Hep C : done   DEXA: 03/28/2022 T-   Problem List Items Addressed This  Visit     Hypothyroidism   Due for re-eval, chronic.  Continue current medication.  Levothyroxine  100 mcg daily      Relevant Orders   T3, free   T4, free   TSH   Morbid obesity (HCC)   BMI 36 with hypercholesterolemia co-morbidity.      Prediabetes   Due for re-eval.      Relevant Orders   Hemoglobin A1c   Pure hypercholesterolemia   Due for re-eval.      Relevant Orders   Comprehensive metabolic panel   Lipid panel   Rheumatoid arthritis (HCC)   Chronic, followed by Dr. Reita with our rheumatology. Treated and well-controlled with methotrexate  and Humira .  Using tramadol  50 mg daily as needed #30 in 1 year      Relevant Medications   traMADol  (ULTRAM ) 50 MG tablet   Other Visit Diagnoses       Routine general medical examination at a health care facility    -  Primary     Cervical cancer screening       Relevant Orders   Cytology - PAP([facility])           Greig Ring, MD   "

## 2024-04-21 NOTE — Assessment & Plan Note (Signed)
 Due for re-eval, chronic.  Continue current medication.  Levothyroxine  100 mcg daily

## 2024-04-21 NOTE — Assessment & Plan Note (Addendum)
 Chronic, followed by Dr. Dolphus rheumatology. Treated and well-controlled with methotrexate  and Humira .  Using tramadol  50 mg daily as needed #30 in 1 year

## 2024-04-21 NOTE — Assessment & Plan Note (Signed)
 Due for re-eval.

## 2024-04-22 LAB — CYTOLOGY - PAP
Comment: NEGATIVE
Diagnosis: NEGATIVE
High risk HPV: NEGATIVE

## 2024-05-08 ENCOUNTER — Other Ambulatory Visit: Payer: Self-pay | Admitting: Physician Assistant

## 2024-05-11 ENCOUNTER — Other Ambulatory Visit: Payer: Self-pay | Admitting: Family Medicine

## 2024-06-02 ENCOUNTER — Ambulatory Visit: Payer: PRIVATE HEALTH INSURANCE | Admitting: Rheumatology
